# Patient Record
Sex: Male | Born: 1955 | Race: White | Hispanic: No | Marital: Single | Smoking: Former smoker
Health system: Southern US, Community
[De-identification: ages and names within clinical notes are randomized; demographics above are authoritative.]

## PROBLEM LIST (undated history)

## (undated) DIAGNOSIS — H269 Unspecified cataract: Secondary | ICD-10-CM

## (undated) DIAGNOSIS — M1990 Unspecified osteoarthritis, unspecified site: Secondary | ICD-10-CM

## (undated) DIAGNOSIS — E785 Hyperlipidemia, unspecified: Secondary | ICD-10-CM

## (undated) DIAGNOSIS — I10 Essential (primary) hypertension: Secondary | ICD-10-CM

## (undated) HISTORY — DX: Unspecified cataract: H26.9

## (undated) HISTORY — DX: Hyperlipidemia, unspecified: E78.5

## (undated) HISTORY — PX: COLONOSCOPY: SHX174

## (undated) HISTORY — DX: Essential (primary) hypertension: I10

## (undated) HISTORY — DX: Unspecified osteoarthritis, unspecified site: M19.90

---

## 2003-12-31 ENCOUNTER — Emergency Department (HOSPITAL_COMMUNITY): Admission: EM | Admit: 2003-12-31 | Discharge: 2003-12-31 | Payer: Self-pay | Admitting: Family Medicine

## 2016-04-05 DIAGNOSIS — Z1159 Encounter for screening for other viral diseases: Secondary | ICD-10-CM | POA: Diagnosis not present

## 2016-04-05 DIAGNOSIS — I10 Essential (primary) hypertension: Secondary | ICD-10-CM | POA: Diagnosis not present

## 2016-04-05 DIAGNOSIS — I2510 Atherosclerotic heart disease of native coronary artery without angina pectoris: Secondary | ICD-10-CM | POA: Diagnosis not present

## 2016-04-05 DIAGNOSIS — E7800 Pure hypercholesterolemia, unspecified: Secondary | ICD-10-CM | POA: Diagnosis not present

## 2016-04-17 DIAGNOSIS — M5431 Sciatica, right side: Secondary | ICD-10-CM | POA: Diagnosis not present

## 2016-04-17 DIAGNOSIS — M9901 Segmental and somatic dysfunction of cervical region: Secondary | ICD-10-CM | POA: Diagnosis not present

## 2016-04-17 DIAGNOSIS — M9904 Segmental and somatic dysfunction of sacral region: Secondary | ICD-10-CM | POA: Diagnosis not present

## 2016-04-17 DIAGNOSIS — M531 Cervicobrachial syndrome: Secondary | ICD-10-CM | POA: Diagnosis not present

## 2016-08-09 DIAGNOSIS — R52 Pain, unspecified: Secondary | ICD-10-CM | POA: Diagnosis not present

## 2016-08-09 DIAGNOSIS — J4521 Mild intermittent asthma with (acute) exacerbation: Secondary | ICD-10-CM | POA: Diagnosis not present

## 2016-08-09 DIAGNOSIS — B349 Viral infection, unspecified: Secondary | ICD-10-CM | POA: Diagnosis not present

## 2016-08-09 DIAGNOSIS — Z6831 Body mass index (BMI) 31.0-31.9, adult: Secondary | ICD-10-CM | POA: Diagnosis not present

## 2016-08-16 DIAGNOSIS — Z1211 Encounter for screening for malignant neoplasm of colon: Secondary | ICD-10-CM | POA: Diagnosis not present

## 2016-08-16 DIAGNOSIS — B349 Viral infection, unspecified: Secondary | ICD-10-CM | POA: Diagnosis not present

## 2016-08-16 DIAGNOSIS — J4531 Mild persistent asthma with (acute) exacerbation: Secondary | ICD-10-CM | POA: Diagnosis not present

## 2016-08-16 DIAGNOSIS — M109 Gout, unspecified: Secondary | ICD-10-CM | POA: Diagnosis not present

## 2016-11-10 DIAGNOSIS — Z0000 Encounter for general adult medical examination without abnormal findings: Secondary | ICD-10-CM | POA: Diagnosis not present

## 2017-01-08 DIAGNOSIS — M5416 Radiculopathy, lumbar region: Secondary | ICD-10-CM | POA: Diagnosis not present

## 2017-01-08 DIAGNOSIS — M9904 Segmental and somatic dysfunction of sacral region: Secondary | ICD-10-CM | POA: Diagnosis not present

## 2017-01-08 DIAGNOSIS — M9903 Segmental and somatic dysfunction of lumbar region: Secondary | ICD-10-CM | POA: Diagnosis not present

## 2017-01-08 DIAGNOSIS — M545 Low back pain: Secondary | ICD-10-CM | POA: Diagnosis not present

## 2017-03-08 DIAGNOSIS — Z6831 Body mass index (BMI) 31.0-31.9, adult: Secondary | ICD-10-CM | POA: Diagnosis not present

## 2017-03-08 DIAGNOSIS — M2550 Pain in unspecified joint: Secondary | ICD-10-CM | POA: Diagnosis not present

## 2017-03-08 DIAGNOSIS — M7711 Lateral epicondylitis, right elbow: Secondary | ICD-10-CM | POA: Diagnosis not present

## 2017-05-14 DIAGNOSIS — M62830 Muscle spasm of back: Secondary | ICD-10-CM | POA: Diagnosis not present

## 2017-05-14 DIAGNOSIS — M797 Fibromyalgia: Secondary | ICD-10-CM | POA: Diagnosis not present

## 2017-05-14 DIAGNOSIS — M9902 Segmental and somatic dysfunction of thoracic region: Secondary | ICD-10-CM | POA: Diagnosis not present

## 2017-05-14 DIAGNOSIS — M546 Pain in thoracic spine: Secondary | ICD-10-CM | POA: Diagnosis not present

## 2017-05-25 DIAGNOSIS — M546 Pain in thoracic spine: Secondary | ICD-10-CM | POA: Diagnosis not present

## 2017-05-25 DIAGNOSIS — M797 Fibromyalgia: Secondary | ICD-10-CM | POA: Diagnosis not present

## 2017-05-25 DIAGNOSIS — M9902 Segmental and somatic dysfunction of thoracic region: Secondary | ICD-10-CM | POA: Diagnosis not present

## 2017-05-25 DIAGNOSIS — M62830 Muscle spasm of back: Secondary | ICD-10-CM | POA: Diagnosis not present

## 2017-06-04 ENCOUNTER — Ambulatory Visit: Payer: BLUE CROSS/BLUE SHIELD | Admitting: Family Medicine

## 2017-06-04 ENCOUNTER — Encounter: Payer: Self-pay | Admitting: Family Medicine

## 2017-06-04 VITALS — BP 130/80 | HR 72 | Temp 97.9°F | Ht 70.0 in | Wt 223.2 lb

## 2017-06-04 DIAGNOSIS — G8929 Other chronic pain: Secondary | ICD-10-CM | POA: Diagnosis not present

## 2017-06-04 DIAGNOSIS — M1040 Other secondary gout, unspecified site: Secondary | ICD-10-CM

## 2017-06-04 DIAGNOSIS — M1A9XX0 Chronic gout, unspecified, without tophus (tophi): Secondary | ICD-10-CM | POA: Insufficient documentation

## 2017-06-04 DIAGNOSIS — M109 Gout, unspecified: Secondary | ICD-10-CM | POA: Insufficient documentation

## 2017-06-04 DIAGNOSIS — E7800 Pure hypercholesterolemia, unspecified: Secondary | ICD-10-CM

## 2017-06-04 DIAGNOSIS — Z23 Encounter for immunization: Secondary | ICD-10-CM | POA: Diagnosis not present

## 2017-06-04 DIAGNOSIS — M2550 Pain in unspecified joint: Secondary | ICD-10-CM | POA: Diagnosis not present

## 2017-06-04 DIAGNOSIS — I10 Essential (primary) hypertension: Secondary | ICD-10-CM | POA: Diagnosis not present

## 2017-06-04 MED ORDER — CHLORTHALIDONE 25 MG PO TABS: 25.0000 mg | ORAL_TABLET | Freq: Every day | ORAL | 1 refills | Status: DC

## 2017-06-04 MED ORDER — INDOMETHACIN ER 75 MG PO CPCR: 75.0000 mg | ORAL_CAPSULE | Freq: Two times a day (BID) | ORAL | 0 refills | Status: AC

## 2017-06-04 MED ORDER — ROSUVASTATIN CALCIUM 20 MG PO TABS: 20.0000 mg | ORAL_TABLET | Freq: Every day | ORAL | 1 refills | Status: DC

## 2017-06-04 NOTE — Progress Notes (Addendum)
Subjective:  Patient ID: Eric Salas, male    DOB: 07-30-1955  Age: 61 y.o. MRN: [id number]  CC: Establish Care   HPI Eric Salas presents for follow-up of his blood pressure, elevated cholesterol, and gallop.  He is nonfasting today.  He quit tobacco 10 years ago.  He has been doing well.  He and his wife recently adopted 70 year old twins.  His wife had been a Godmother to their mom.  Their mother recently passed one week ago.  He has had no problems with his medicines.  He does not drink or use illicit drugs.  He is having no problems with his medicines.  He is going to try to lose some weight with the [employer].  He was never able to obtain a colonoscopy or a chest x-ray when I last saw him.  He will return for blood work and to see me on exam in we will order those at that time.  History Barkley has a past medical history of Arthritis, Hyperlipidemia, and Hypertension.   He has no past surgical history on file.   His family history includes Alcohol abuse in his father; Cancer in his brother and mother; Diabetes in his brother, brother, and father; Drug abuse in his brother; Hearing loss in his brother; Heart attack in his father; Heart disease in his father; High Cholesterol in his brother, brother, and father; High blood pressure in his father; Miscarriages / [place] in his mother; Stroke in his father.He reports that he has quit smoking. he has never used smokeless tobacco. He reports that he does not drink alcohol or use drugs.  Outpatient Medications Prior to Visit  Medication Sig Dispense Refill  . chlorthalidone (HYGROTON) 25 MG tablet Take 25 mg by mouth. Take 1 tablet by mouth daily.    . indomethacin (INDOCIN SR) 75 MG CR capsule Take 75 mg by mouth. Take 1 capsule by mouth daily.    . rosuvastatin (CRESTOR) 20 MG tablet Take 20 mg by mouth. Take 1 tablet by mouth daily.     No facility-administered medications prior to visit.     ROS Review of Systems    Constitutional: Negative.  Negative for chills, fatigue and fever.  HENT: Positive for postnasal drip. Negative for sinus pain, sore throat and voice change.   Eyes: Negative.   Respiratory: Positive for cough.   Cardiovascular: Negative.   Gastrointestinal: Negative.   Endocrine: Negative for polydipsia and polyuria.  Genitourinary: Negative.   Musculoskeletal: Positive for arthralgias. Negative for myalgias.  Allergic/Immunologic: Negative for immunocompromised state.  Neurological: Negative for weakness and headaches.  Hematological: Negative.   Psychiatric/Behavioral: Negative.     Objective:  BP 130/80   Pulse 72   Temp 97.9 F (36.6 C) (Oral)   Ht 5\' 10"  (1.778 m)   Wt 223 lb 4 oz (101.3 kg)   SpO2 98%   BMI 32.03 kg/m   Physical Exam  Constitutional: He is oriented to person, place, and time. He appears well-developed and well-nourished. No distress.  HENT:  Head: Normocephalic and atraumatic.  Right Ear: External ear normal.  Left Ear: External ear normal.  Mouth/Throat: Oropharynx is clear and moist. No oropharyngeal exudate.  Eyes: Conjunctivae are normal. Pupils are equal, round, and reactive to light. Right eye exhibits no discharge. Left eye exhibits no discharge. No scleral icterus.  Neck: Neck supple. No JVD present. No tracheal deviation present. No thyromegaly present.  Cardiovascular: Normal rate, regular rhythm and normal heart sounds.  Pulmonary/Chest: Effort normal. No stridor. He has decreased breath sounds. He has no wheezes. He has no rhonchi. He has no rales.  Abdominal: Bowel sounds are normal.  Lymphadenopathy:    He has no cervical adenopathy.  Neurological: He is alert and oriented to person, place, and time.  Skin: Skin is warm and dry. He is not diaphoretic.  Psychiatric: He has a normal mood and affect. His behavior is normal.      Assessment & Plan:   Derrius was seen today for establish care.  Diagnoses and all orders for this  visit:  HTN (hypertension), benign -     chlorthalidone (HYGROTON) 25 MG tablet; Take 1 tablet (25 mg total) daily by mouth. Take 1 tablet by mouth daily. -     CBC; Future -     Comprehensive metabolic panel; Future -     TSH; Future -     Urinalysis, Routine w reflex microscopic; Future  Elevated LDL cholesterol level -     rosuvastatin (CRESTOR) 20 MG tablet; Take 1 tablet (20 mg total) daily by mouth. Take 1 tablet by mouth daily. -     Comprehensive metabolic panel; Future -     Lipid panel; Future -     TSH; Future  Other secondary gout, unspecified chronicity, unspecified site -     indomethacin (INDOCIN SR) 75 MG CR capsule; Take 1 capsule (75 mg total) 2 (two) times daily with a meal by mouth. As needed for gout attacks. -     CBC; Future -     Comprehensive metabolic panel; Future -     Urinalysis, Routine w reflex microscopic; Future -     Uric acid  Need for influenza vaccination -     Flu Vaccine QUAD 36+ mos IM  Chronic joint pain   I have changed Remo Lipps D. Mcclarty's chlorthalidone and rosuvastatin.  Meds ordered this encounter  Medications  . chlorthalidone (HYGROTON) 25 MG tablet    Sig: Take 1 tablet (25 mg total) daily by mouth. Take 1 tablet by mouth daily.    Dispense:  90 tablet    Refill:  1  . indomethacin (INDOCIN SR) 75 MG CR capsule    Sig: Take 1 capsule (75 mg total) 2 (two) times daily with a meal by mouth. As needed for gout attacks.    Dispense:  60 capsule    Refill:  0  . rosuvastatin (CRESTOR) 20 MG tablet    Sig: Take 1 tablet (20 mg total) daily by mouth. Take 1 tablet by mouth daily.    Dispense:  90 tablet    Refill:  1     Follow-up: Return in about 3 months (around 09/04/2017).  Libby Maw, MD

## 2017-06-05 ENCOUNTER — Encounter: Payer: Self-pay | Admitting: Family Medicine

## 2017-06-11 ENCOUNTER — Ambulatory Visit: Payer: BLUE CROSS/BLUE SHIELD | Admitting: Family Medicine

## 2017-06-11 DIAGNOSIS — I10 Essential (primary) hypertension: Secondary | ICD-10-CM | POA: Diagnosis not present

## 2017-06-11 DIAGNOSIS — M2550 Pain in unspecified joint: Secondary | ICD-10-CM

## 2017-06-11 DIAGNOSIS — M1040 Other secondary gout, unspecified site: Secondary | ICD-10-CM | POA: Diagnosis not present

## 2017-06-11 DIAGNOSIS — E7800 Pure hypercholesterolemia, unspecified: Secondary | ICD-10-CM

## 2017-06-11 DIAGNOSIS — G8929 Other chronic pain: Secondary | ICD-10-CM

## 2017-06-11 LAB — CBC
HEMATOCRIT: 42.6 % (ref 39.0–52.0)
HEMOGLOBIN: 14.3 g/dL (ref 13.0–17.0)
MCHC: 33.5 g/dL (ref 30.0–36.0)
MCV: 90.7 fl (ref 78.0–100.0)
PLATELETS: 167 10*3/uL (ref 150.0–400.0)
RBC: 4.7 Mil/uL (ref 4.22–5.81)
RDW: 12.8 % (ref 11.5–15.5)
WBC: 6.6 10*3/uL (ref 4.0–10.5)

## 2017-06-11 LAB — COMPREHENSIVE METABOLIC PANEL
ALK PHOS: 44 U/L (ref 39–117)
ALT: 21 U/L (ref 0–53)
AST: 19 U/L (ref 0–37)
Albumin: 4.2 g/dL (ref 3.5–5.2)
BUN: 19 mg/dL (ref 6–23)
CHLORIDE: 101 meq/L (ref 96–112)
CO2: 33 meq/L — AB (ref 19–32)
Calcium: 9.3 mg/dL (ref 8.4–10.5)
Creatinine, Ser: 0.98 mg/dL (ref 0.40–1.50)
GFR: 82.77 mL/min (ref >60.00)
GLUCOSE: 116 mg/dL — AB (ref 70–99)
POTASSIUM: 4.1 meq/L (ref 3.5–5.1)
SODIUM: 142 meq/L (ref 135–145)
TOTAL PROTEIN: 6.7 g/dL (ref 6.0–8.3)
Total Bilirubin: 0.5 mg/dL (ref 0.2–1.2)

## 2017-06-11 LAB — URINALYSIS, ROUTINE W REFLEX MICROSCOPIC
Bilirubin Urine: NEGATIVE
Hgb urine dipstick: NEGATIVE
Ketones, ur: NEGATIVE
Leukocytes, UA: NEGATIVE
NITRITE: NEGATIVE
RBC / HPF: NONE SEEN (ref 0–?)
SPECIFIC GRAVITY, URINE: 1.02 (ref 1.000–1.030)
Total Protein, Urine: NEGATIVE
URINE GLUCOSE: NEGATIVE
UROBILINOGEN UA: 0.2 (ref 0.0–1.0)
WBC UA: NONE SEEN (ref 0–?)
pH: 6 (ref 5.0–8.0)

## 2017-06-11 LAB — TSH: TSH: 1.08 u[IU]/mL (ref 0.35–4.50)

## 2017-06-11 LAB — LIPID PANEL
CHOL/HDL RATIO: 3
Cholesterol: 125 mg/dL (ref 0–200)
HDL: 39.6 mg/dL (ref >39.00)
LDL Cholesterol: 70 mg/dL (ref 0–99)
NONHDL: 85.46
Triglycerides: 78 mg/dL (ref 0.0–149.0)
VLDL: 15.6 mg/dL (ref 0.0–40.0)

## 2017-06-12 ENCOUNTER — Encounter: Payer: Self-pay | Admitting: Family Medicine

## 2017-06-12 NOTE — Progress Notes (Addendum)
Labs only

## 2017-08-01 ENCOUNTER — Telehealth: Payer: Self-pay

## 2017-08-01 DIAGNOSIS — G8929 Other chronic pain: Secondary | ICD-10-CM | POA: Insufficient documentation

## 2017-08-01 DIAGNOSIS — Z23 Encounter for immunization: Secondary | ICD-10-CM | POA: Insufficient documentation

## 2017-08-01 DIAGNOSIS — M2550 Pain in unspecified joint: Principal | ICD-10-CM

## 2017-08-01 NOTE — Addendum Note (Signed)
Addended by: Jon Billings on: 08/01/2017 09:42 AM   Modules accepted: Orders

## 2017-08-01 NOTE — Telephone Encounter (Signed)
Okay to refer to neurology?   Copied from [place] [id number]. Topic: Referral - Request >> Aug 01, 2017  8:13 AM Ether Griffins B wrote: Reason for CRM: pt requesting referral for neurology due to a lot of joint pain.

## 2017-08-01 NOTE — Telephone Encounter (Signed)
Patient wants to see Ortho, not neurology.

## 2017-08-01 NOTE — Telephone Encounter (Signed)
Referral has been entered.

## 2017-08-02 NOTE — Telephone Encounter (Signed)
Patient has been scheduled for 4pm tomorrow to see Dr. Raeford Razor, patient is aware.

## 2017-08-03 ENCOUNTER — Ambulatory Visit: Payer: BLUE CROSS/BLUE SHIELD | Admitting: Family Medicine

## 2017-08-03 ENCOUNTER — Encounter: Payer: Self-pay | Admitting: Family Medicine

## 2017-08-03 VITALS — BP 142/80 | HR 60 | Temp 97.8°F | Ht 70.0 in | Wt 223.0 lb

## 2017-08-03 DIAGNOSIS — G8929 Other chronic pain: Secondary | ICD-10-CM

## 2017-08-03 DIAGNOSIS — M2550 Pain in unspecified joint: Secondary | ICD-10-CM

## 2017-08-03 DIAGNOSIS — M25562 Pain in left knee: Secondary | ICD-10-CM | POA: Diagnosis not present

## 2017-08-03 DIAGNOSIS — M1A9XX0 Chronic gout, unspecified, without tophus (tophi): Secondary | ICD-10-CM | POA: Diagnosis not present

## 2017-08-03 DIAGNOSIS — M7041 Prepatellar bursitis, right knee: Secondary | ICD-10-CM

## 2017-08-03 LAB — URIC ACID: Uric Acid, Serum: 8.9 mg/dL — ABNORMAL HIGH (ref 4.0–7.8)

## 2017-08-03 LAB — CK: CK TOTAL: 110 U/L (ref 7–232)

## 2017-08-03 MED ORDER — DICLOFENAC SODIUM 2 % TD SOLN: 1.0000 "application " | Freq: Two times a day (BID) | TRANSDERMAL | 3 refills | Status: DC

## 2017-08-03 NOTE — Patient Instructions (Signed)
We will call you with the results from today  Try wearing an ACE wrap on your right knee  I have sent a medicine to rub onto your left knee

## 2017-08-03 NOTE — Progress Notes (Signed)
Eric Salas - 62 y.o. male MRN [id number]  Date of birth: May 13, 1956  SUBJECTIVE:  Including CC & ROS.  Chief Complaint  Patient presents with  . Bilateral knee pain    Eric Salas is a 62 y.o. male that is presenting with bilateral knee pain. Pain has been ongoing for five days. Left is worse than right. He states the pain was severe last night. He was unable to bend his knee. He states swelling is present. Denies any injury to his knee. He has history of gout, he took two Indocin last night which improved his pain. He thinks it could be his gout flaring up. He applied ice and heat which improved some. Pain is located in the medial aspect of his left knee. He has pain on the anterior aspect of his right knee. He has to work on his Conservation officer, historic buildings. He feels the right knee pain is acute. The left knee pain is acute on chronic. The pain is stabbing and can be severe in nature. The pain is localized to the knee.      Review of Systems  Constitutional: Negative for fever.  HENT: Negative for sinus pain.   Respiratory: Negative for cough.   Cardiovascular: Negative for chest pain.  Gastrointestinal: Negative for abdominal pain.  Musculoskeletal: Positive for arthralgias and joint swelling. Negative for gait problem.  Skin: Negative for color change.  Neurological: Negative for weakness.  Hematological: Negative for adenopathy.  Psychiatric/Behavioral: Negative for agitation.    HISTORY: Past Medical, Surgical, Social, and Family History Reviewed & Updated per EMR.   Pertinent Historical Findings include:  Past Medical History:  Diagnosis Date  . Arthritis   . Hyperlipidemia   . Hypertension     No past surgical history on file.  Allergies  Allergen Reactions  . Codeine     Family History  Problem Relation Age of Onset  . Cancer Mother   . Miscarriages / [place] Mother   . Alcohol abuse Father   . Diabetes Father   . Heart attack Father   . Heart  disease Father   . High Cholesterol Father   . High blood pressure Father   . Stroke Father   . Diabetes Brother   . Hearing loss Brother   . High Cholesterol Brother   . Cancer Brother   . Diabetes Brother   . Drug abuse Brother   . High Cholesterol Brother      Social History   Socioeconomic History  . Marital status: Married    Spouse name: Not on file  . Number of children: Not on file  . Years of education: Not on file  . Highest education level: Not on file  Social Needs  . Financial resource strain: Not hard at all  . Food insecurity - worry: Never true  . Food insecurity - inability: Never true  . Transportation needs - medical: Not on file  . Transportation needs - non-medical: Not on file  Occupational History  . Not on file  Tobacco Use  . Smoking status: Former Research scientist (life sciences)  . Smokeless tobacco: Never Used  Substance and Sexual Activity  . Alcohol use: No    Frequency: Never  . Drug use: No  . Sexual activity: Yes    Partners: Female  Other Topics Concern  . Not on file  Social History Narrative  . Not on file     PHYSICAL EXAM:  VS: BP (!) 142/80 (BP Location: Left Arm, Patient Position: Sitting,  Cuff Size: Normal)   Pulse 60   Temp 97.8 F (36.6 C) (Oral)   Ht 5\' 10"  (1.778 m)   Wt 223 lb (101.2 kg)   SpO2 97%   BMI 32.00 kg/m  Physical Exam Gen: NAD, alert, cooperative with exam, well-appearing ENT: normal lips, normal nasal mucosa,  Eye: normal EOM, normal conjunctiva and lids CV:  no edema, +2 pedal pulses   Resp: no accessory muscle use, non-labored,  Skin: no rashes, no areas of induration  Neuro: normal tone, normal sensation to touch Psych:  normal insight, alert and oriented MSK:  Left Knee: Normal to inspection with no erythema or effusion or obvious bony abnormalities. Palpation normal with no warmth, joint line tenderness, patellar tenderness, or condyle tenderness. ROM full in flexion and extension and lower leg  rotation. Ligaments with solid consistent endpoints including  LCL, MCL. Negative Mcmurray's Non painful patellar compression. Patellar glide without crepitus. Patellar and quadriceps tendons unremarkable. Hamstring and quadriceps strength is normal.  Right knee:  Prepatellar bursa present. No tenderness to palpation of the medial lateral joint line. Normal flexion and extension. Normal strength loquat a patellar testing. Neurovascularly intact   Limited ultrasound: Right and left knee:  Left knee:  Mild to moderate effusion within the suprapatellar pouch. Minimal medial joint space narrowing. 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 Right knee with a prepatellar bursitis  Ultrasound and interpretation by Clearance Coots, MD   Aspiration/Injection Procedure Note Eric Salas 25-May-1956  Procedure: Injection Indications: left knee pain   Procedure Details Consent: Risks of procedure as well as the alternatives and risks of each were explained to the (patient/caregiver).  Consent for procedure obtained. Time Out: Verified patient identification, verified procedure, site/side was marked, verified correct patient position, special equipment/implants available, medications/allergies/relevent history reviewed, required imaging and test results available.  Performed.  The area was cleaned with iodine and alcohol swabs.    The left knee joint was injected using 1 cc's of 40 mg  kenalog and 4 cc's of 0.25% bupivacaine with a 22 1 1/2" needle.  Ultrasound was used. Images were obtained in Long views showing the injection.    A sterile dressing was applied.  Patient did tolerate procedure  well.       ASSESSMENT & PLAN:   Prepatellar bursitis of right knee Likely traumatic from working on his knees. No signs of infection - Counseled and advised compression - If no improvement can aspirate  Chronic pain of left knee It appears that he has some crystalline deposition within the meniscus to suggest some chondrocalcinosis. Has likely a component of arthritic change. - Injection today - Provided Pennsaid - If no improvement consider x-rays and physical therapy.  Gout Has not had a uric acid level checked in some time. Does not take any controller medication. Reports having a couple of flares past week - Uric acid - Counseled and discussed that if his levels are elevated then being on a controller medication may be beneficial.  Chronic joint pain Possible that his pain could be related to gout. He has been taken a statin for a few years and could be associated with the statin use. - CK - Counseled to take the statin every other day

## 2017-08-03 NOTE — Assessment & Plan Note (Signed)
Likely traumatic from working on his knees. No signs of infection - Counseled and advised compression - If no improvement can aspirate

## 2017-08-03 NOTE — Assessment & Plan Note (Signed)
It appears that he has some crystalline deposition within the meniscus to suggest some chondrocalcinosis. Has likely a component of arthritic change. - Injection today - Provided Pennsaid - If no improvement consider x-rays and physical therapy.

## 2017-08-03 NOTE — Assessment & Plan Note (Signed)
Possible that his pain could be related to gout. He has been taken a statin for a few years and could be associated with the statin use. - CK - Counseled to take the statin every other day

## 2017-08-03 NOTE — Assessment & Plan Note (Signed)
Has not had a uric acid level checked in some time. Does not take any controller medication. Reports having a couple of flares past week - Uric acid - Counseled and discussed that if his levels are elevated then being on a controller medication may be beneficial.

## 2017-08-06 ENCOUNTER — Telehealth: Payer: Self-pay | Admitting: Family Medicine

## 2017-08-06 MED ORDER — ALLOPURINOL 100 MG PO TABS: 100.0000 mg | ORAL_TABLET | Freq: Every day | ORAL | 6 refills | Status: DC

## 2017-08-06 NOTE — Telephone Encounter (Signed)
Discussed his results. Elevated uric acid so will start allopurinol.   Could consider switching chlorthalidone to losartan to help with lowering uric acid.   Rosemarie Ax, MD [facility] 08/06/2017, 10:10 AM

## 2017-09-04 ENCOUNTER — Encounter: Payer: Self-pay | Admitting: Family Medicine

## 2017-09-04 ENCOUNTER — Encounter: Payer: Self-pay | Admitting: Gastroenterology

## 2017-09-04 ENCOUNTER — Ambulatory Visit: Payer: BLUE CROSS/BLUE SHIELD | Admitting: Family Medicine

## 2017-09-04 VITALS — BP 130/80 | HR 65 | Ht 70.0 in | Wt 225.2 lb

## 2017-09-04 DIAGNOSIS — G8929 Other chronic pain: Secondary | ICD-10-CM | POA: Diagnosis not present

## 2017-09-04 DIAGNOSIS — Z1211 Encounter for screening for malignant neoplasm of colon: Secondary | ICD-10-CM | POA: Diagnosis not present

## 2017-09-04 DIAGNOSIS — Z125 Encounter for screening for malignant neoplasm of prostate: Secondary | ICD-10-CM

## 2017-09-04 DIAGNOSIS — I10 Essential (primary) hypertension: Secondary | ICD-10-CM | POA: Diagnosis not present

## 2017-09-04 DIAGNOSIS — M2550 Pain in unspecified joint: Secondary | ICD-10-CM

## 2017-09-04 DIAGNOSIS — M109 Gout, unspecified: Secondary | ICD-10-CM

## 2017-09-04 DIAGNOSIS — E7800 Pure hypercholesterolemia, unspecified: Secondary | ICD-10-CM

## 2017-09-04 DIAGNOSIS — Z0000 Encounter for general adult medical examination without abnormal findings: Secondary | ICD-10-CM | POA: Insufficient documentation

## 2017-09-04 MED ORDER — IRBESARTAN 150 MG PO TABS: 150.0000 mg | ORAL_TABLET | Freq: Every day | ORAL | 0 refills | Status: DC

## 2017-09-04 MED ORDER — ROSUVASTATIN CALCIUM 20 MG PO TABS: 20.0000 mg | ORAL_TABLET | ORAL | 0 refills | Status: DC

## 2017-09-04 MED ORDER — INDOMETHACIN 50 MG PO CAPS: 50.0000 mg | ORAL_CAPSULE | Freq: Three times a day (TID) | ORAL | 1 refills | Status: DC

## 2017-09-04 MED ORDER — COLCHICINE 0.6 MG PO TABS: 0.6000 mg | ORAL_TABLET | Freq: Two times a day (BID) | ORAL | 0 refills | Status: DC

## 2017-09-04 MED ORDER — ALLOPURINOL 100 MG PO TABS: 100.0000 mg | ORAL_TABLET | Freq: Every day | ORAL | 1 refills | Status: DC

## 2017-09-04 NOTE — Progress Notes (Signed)
Subjective:  Patient ID: Eric Salas, male    DOB: Apr 01, 1956  Age: 62 y.o. MRN: [id number]  CC: Follow-up   HPI Eric Salas presents for follow-up of his hypertension, gout and elevated cholesterol.  Sports medicine doctor introduced allopurinol and patient developed gouty attacks while taking it so he was forced to discontinue it.  Patient has never taken colchicine before.  Patient feels as though he is developed some joint aches and pains taking the Crestor daily.  He decreased the Crestor to every other day and his joint aches and pains had improved.  He tells that the left knee responded nicely to a steroid injection.  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  He is due for colonoscopy.  He has had no symptoms referrable to the GI tract.  His urine flow is good.  Outpatient Medications Prior to Visit  Medication Sig Dispense Refill  . Diclofenac Sodium (PENNSAID) 2 % SOLN Place 1 application onto the skin 2 (two) times daily. 1 Bottle 3  . allopurinol (ZYLOPRIM) 100 MG tablet Take 1 tablet (100 mg total) by mouth daily. 30 tablet 6  . chlorthalidone (HYGROTON) 25 MG tablet Take 1 tablet (25 mg total) daily by mouth. Take 1 tablet by mouth daily. 90 tablet 1  . rosuvastatin (CRESTOR) 20 MG tablet Take 20 mg by mouth every other day.    . rosuvastatin (CRESTOR) 20 MG tablet Take 1 tablet (20 mg total) daily by mouth. Take 1 tablet by mouth daily. (Patient taking differently: Take 20 mg by mouth every other day. Take 1 tablet by mouth daily.) 90 tablet 1   No facility-administered medications prior to visit.     ROS Review of Systems  Constitutional: Negative.   HENT: Negative.   Eyes: Negative for photophobia.  Respiratory: Negative.   Cardiovascular: Negative.   Gastrointestinal: Negative.  Negative for anal bleeding, blood in stool, constipation and diarrhea.  Endocrine: Negative for polyphagia and polyuria.    Genitourinary: Negative for difficulty urinating, frequency and urgency.  Musculoskeletal: Positive for arthralgias.  Skin: Negative for rash.  Allergic/Immunologic: Negative for immunocompromised state.  Neurological: Negative for weakness and headaches.  Hematological: Does not bruise/bleed easily.  Psychiatric/Behavioral: Negative.     Objective:  BP 130/80 (BP Location: Left Arm, Patient Position: Sitting, Cuff Size: Normal)   Pulse 65   Ht 5\' 10"  (1.778 m)   Wt 225 lb 4 oz (102.2 kg)   SpO2 98%   BMI 32.32 kg/m   BP Readings from Last 3 Encounters:  09/04/17 130/80  08/03/17 (!) 142/80  06/04/17 130/80    Wt Readings from Last 3 Encounters:  09/04/17 225 lb 4 oz (102.2 kg)  08/03/17 223 lb (101.2 kg)  06/04/17 223 lb 4 oz (101.3 kg)    Physical Exam  Constitutional: He is oriented to person, place, and time. He appears well-developed and well-nourished. No distress.  HENT:  Head: Normocephalic and atraumatic.  Right Ear: External ear normal.  Left Ear: External ear normal.  Eyes: Right eye exhibits no discharge. Left eye exhibits no discharge. No scleral icterus.  Neck: No JVD present. No tracheal deviation present.  Cardiovascular: Normal rate, regular rhythm and normal heart sounds.  Pulmonary/Chest: Effort normal and breath sounds normal. No stridor.  Abdominal: Bowel sounds are normal.  Neurological: He is alert and oriented to person, place, and time.  Skin: Skin is dry. No rash noted. He  is not diaphoretic. No erythema.  Psychiatric: He has a normal mood and affect. His behavior is normal.    Lab Results  Component Value Date   WBC 6.6 06/11/2017   HGB 14.3 06/11/2017   HCT 42.6 06/11/2017   PLT 167.0 06/11/2017   GLUCOSE 116 (H) 06/11/2017   CHOL 125 06/11/2017   TRIG 78.0 06/11/2017   HDL 39.60 06/11/2017   LDLCALC 70 06/11/2017   ALT 21 06/11/2017   AST 19 06/11/2017   NA 142 06/11/2017   K 4.1 06/11/2017   CL 101 06/11/2017   CREATININE  0.98 06/11/2017   BUN 19 06/11/2017   CO2 33 (H) 06/11/2017   TSH 1.08 06/11/2017    No results found.  Assessment & Plan:   Miqueas was seen today for follow-up.  Diagnoses and all orders for this visit:  HTN (hypertension), benign -     irbesartan (AVAPRO) 150 MG tablet; Take 1 tablet (150 mg total) by mouth daily. -     Comprehensive metabolic panel; Future  Elevated LDL cholesterol level -     rosuvastatin (CRESTOR) 20 MG tablet; Take 1 tablet (20 mg total) by mouth every other day. -     Comprehensive metabolic panel; Future -     Lipid panel; Future  Chronic joint pain  Gout, unspecified cause, unspecified chronicity, unspecified site -     allopurinol (ZYLOPRIM) 100 MG tablet; Take 1 tablet (100 mg total) by mouth daily. -     colchicine 0.6 MG tablet; Take 1 tablet (0.6 mg total) by mouth 2 (two) times daily. -     Comprehensive metabolic panel; Future -     Uric acid; Future -     indomethacin (INDOCIN) 50 MG capsule; Take 1 capsule (50 mg total) by mouth 3 (three) times daily with meals. As needed for gouty flairs.  Screen for colon cancer -     Ambulatory referral to Gastroenterology  Screening for prostate cancer -     PSA; Future   I have discontinued Eric Salas. Eric Salas's chlorthalidone and rosuvastatin. I have also changed his rosuvastatin. Additionally, I am having him start on irbesartan, colchicine, and indomethacin. Lastly, I am having him maintain his Diclofenac Sodium and allopurinol.  Meds ordered this encounter  Medications  . allopurinol (ZYLOPRIM) 100 MG tablet    Sig: Take 1 tablet (100 mg total) by mouth daily.    Dispense:  90 tablet    Refill:  1  . irbesartan (AVAPRO) 150 MG tablet    Sig: Take 1 tablet (150 mg total) by mouth daily.    Dispense:  90 tablet    Refill:  0  . rosuvastatin (CRESTOR) 20 MG tablet    Sig: Take 1 tablet (20 mg total) by mouth every other day.    Dispense:  90 tablet    Refill:  0  . colchicine 0.6 MG  tablet    Sig: Take 1 tablet (0.6 mg total) by mouth 2 (two) times daily.    Dispense:  180 tablet    Refill:  0  . indomethacin (INDOCIN) 50 MG capsule    Sig: Take 1 capsule (50 mg total) by mouth 3 (three) times daily with meals. As needed for gouty flairs.    Dispense:  120 capsule    Refill:  1     Follow-up: No Follow-up on file.  Libby Maw, MD

## 2017-09-18 ENCOUNTER — Encounter: Payer: Self-pay | Admitting: Family Medicine

## 2017-09-18 ENCOUNTER — Ambulatory Visit: Payer: BLUE CROSS/BLUE SHIELD | Admitting: Family Medicine

## 2017-09-18 VITALS — BP 136/70 | HR 72 | Ht 70.0 in | Wt 227.4 lb

## 2017-09-18 DIAGNOSIS — M109 Gout, unspecified: Secondary | ICD-10-CM | POA: Diagnosis not present

## 2017-09-18 DIAGNOSIS — R7309 Other abnormal glucose: Secondary | ICD-10-CM

## 2017-09-18 DIAGNOSIS — Z125 Encounter for screening for malignant neoplasm of prostate: Secondary | ICD-10-CM

## 2017-09-18 DIAGNOSIS — E7800 Pure hypercholesterolemia, unspecified: Secondary | ICD-10-CM

## 2017-09-18 DIAGNOSIS — I10 Essential (primary) hypertension: Secondary | ICD-10-CM | POA: Diagnosis not present

## 2017-09-18 LAB — LIPID PANEL
CHOL/HDL RATIO: 3
CHOLESTEROL: 150 mg/dL (ref 0–200)
HDL: 44.3 mg/dL (ref >39.00)
NonHDL: 106.02
Triglycerides: 227 mg/dL — ABNORMAL HIGH (ref 0.0–149.0)
VLDL: 45.4 mg/dL — ABNORMAL HIGH (ref 0.0–40.0)

## 2017-09-18 LAB — COMPREHENSIVE METABOLIC PANEL
ALK PHOS: 49 U/L (ref 39–117)
ALT: 21 U/L (ref 0–53)
AST: 18 U/L (ref 0–37)
Albumin: 4.2 g/dL (ref 3.5–5.2)
BILIRUBIN TOTAL: 0.4 mg/dL (ref 0.2–1.2)
BUN: 20 mg/dL (ref 6–23)
CALCIUM: 9.6 mg/dL (ref 8.4–10.5)
CO2: 31 meq/L (ref 19–32)
Chloride: 102 mEq/L (ref 96–112)
Creatinine, Ser: 1.11 mg/dL (ref 0.40–1.50)
GFR: 71.63 mL/min (ref >60.00)
GLUCOSE: 152 mg/dL — AB (ref 70–99)
Potassium: 3.8 mEq/L (ref 3.5–5.1)
Sodium: 140 mEq/L (ref 135–145)
TOTAL PROTEIN: 6.7 g/dL (ref 6.0–8.3)

## 2017-09-18 LAB — URIC ACID: Uric Acid, Serum: 8.8 mg/dL — ABNORMAL HIGH (ref 4.0–7.8)

## 2017-09-18 LAB — LDL CHOLESTEROL, DIRECT: Direct LDL: 81 mg/dL

## 2017-09-18 LAB — PSA: PSA: 1.23 ng/mL (ref 0.10–4.00)

## 2017-09-18 MED ORDER — ROSUVASTATIN CALCIUM 20 MG PO TABS: 20.0000 mg | ORAL_TABLET | ORAL | 0 refills | Status: DC

## 2017-09-18 MED ORDER — IRBESARTAN 150 MG PO TABS: 150.0000 mg | ORAL_TABLET | Freq: Every day | ORAL | 0 refills | Status: DC

## 2017-09-18 MED ORDER — ALLOPURINOL 100 MG PO TABS: 100.0000 mg | ORAL_TABLET | Freq: Every day | ORAL | 1 refills | Status: DC

## 2017-09-18 MED ORDER — COLCHICINE 0.6 MG PO TABS: 0.6000 mg | ORAL_TABLET | Freq: Two times a day (BID) | ORAL | 0 refills | Status: DC

## 2017-09-18 NOTE — Progress Notes (Addendum)
Subjective:  Patient ID: Eric Salas, male    DOB: 06-14-1956  Age: 62 y.o. MRN: [id number]  CC: Follow-up   HPI Xzayvier Fagin Kretzschmar presents for follow-up of hypertension and elevated cholesterol.  Blood pressure and cholesterol been well controlled.  He was unable to obtain the colchicine and has been taking the Indocin 3 times daily with the allopurinol to prevent onset of gouty attack and it is has worked.  He has a biometric screen these to be filled out will have blood work drawn today.  Outpatient Medications Prior to Visit  Medication Sig Dispense Refill  . Diclofenac Sodium (PENNSAID) 2 % SOLN Place 1 application onto the skin 2 (two) times daily. 1 Bottle 3  . indomethacin (INDOCIN) 50 MG capsule Take 1 capsule (50 mg total) by mouth 3 (three) times daily with meals. As needed for gouty flairs. 120 capsule 1  . allopurinol (ZYLOPRIM) 100 MG tablet Take 1 tablet (100 mg total) by mouth daily. 90 tablet 1  . colchicine 0.6 MG tablet Take 1 tablet (0.6 mg total) by mouth 2 (two) times daily. 180 tablet 0  . irbesartan (AVAPRO) 150 MG tablet Take 1 tablet (150 mg total) by mouth daily. 90 tablet 0  . rosuvastatin (CRESTOR) 20 MG tablet Take 1 tablet (20 mg total) by mouth every other day. 90 tablet 0   No facility-administered medications prior to visit.     ROS Review of Systems  Constitutional: Negative.   HENT: Negative.   Eyes: Negative.   Respiratory: Negative.   Cardiovascular: Negative.   Gastrointestinal: Negative.   Endocrine: Negative for polyphagia and polyuria.  Genitourinary: Negative.   Musculoskeletal: Negative for arthralgias and joint swelling.  Skin: Negative.   Allergic/Immunologic: Negative for immunocompromised state.  Neurological: Negative for weakness and headaches.  Hematological: Does not bruise/bleed easily.  Psychiatric/Behavioral: Negative.     Objective:  BP 136/70 (BP Location: Left Arm, Patient Position: Sitting, Cuff Size:  Normal)   Pulse 72   Ht 5\' 10"  (1.778 m)   Wt 227 lb 6 oz (103.1 kg)   SpO2 97%   BMI 32.62 kg/m   BP Readings from Last 3 Encounters:  09/18/17 136/70  09/04/17 130/80  08/03/17 (!) 142/80    Wt Readings from Last 3 Encounters:  09/18/17 227 lb 6 oz (103.1 kg)  09/04/17 225 lb 4 oz (102.2 kg)  08/03/17 223 lb (101.2 kg)    Physical Exam  Constitutional: He is oriented to person, place, and time. He appears well-developed and well-nourished. No distress.  HENT:  Head: Normocephalic and atraumatic.  Right Ear: External ear normal.  Left Ear: External ear normal.  Mouth/Throat: Oropharynx is clear and moist. No oropharyngeal exudate.  Eyes: Conjunctivae are normal. Pupils are equal, round, and reactive to light. Right eye exhibits no discharge. Left eye exhibits no discharge. No scleral icterus.  Neck: Neck supple. No JVD present. No tracheal deviation present. No thyromegaly present.  Cardiovascular: Normal rate, regular rhythm and normal heart sounds.  Pulmonary/Chest: Effort normal and breath sounds normal. No stridor.  Abdominal: Bowel sounds are normal.  Lymphadenopathy:    He has no cervical adenopathy.  Neurological: He is alert and oriented to person, place, and time.  Skin: Skin is warm and dry. He is not diaphoretic.  Psychiatric: He has a normal mood and affect. His behavior is normal.    Lab Results  Component Value Date   WBC 6.6 06/11/2017   HGB 14.3 06/11/2017  HCT 42.6 06/11/2017   PLT 167.0 06/11/2017   GLUCOSE 152 (H) 09/18/2017   CHOL 150 09/18/2017   TRIG 227.0 (H) 09/18/2017   HDL 44.30 09/18/2017   LDLDIRECT 81.0 09/18/2017   LDLCALC 70 06/11/2017   ALT 21 09/18/2017   AST 18 09/18/2017   NA 140 09/18/2017   K 3.8 09/18/2017   CL 102 09/18/2017   CREATININE 1.11 09/18/2017   BUN 20 09/18/2017   CO2 31 09/18/2017   TSH 1.08 06/11/2017   PSA 1.23 09/18/2017    No results found.  Assessment & Plan:   Kiet was seen today for  follow-up.  Diagnoses and all orders for this visit:  HTN (hypertension), benign -     irbesartan (AVAPRO) 150 MG tablet; Take 1 tablet (150 mg total) by mouth daily. -     Comprehensive metabolic panel  Elevated LDL cholesterol level -     rosuvastatin (CRESTOR) 20 MG tablet; Take 1 tablet (20 mg total) by mouth every other day. -     Lipid panel -     Comprehensive metabolic panel  Gout, unspecified cause, unspecified chronicity, unspecified site -     allopurinol (ZYLOPRIM) 100 MG tablet; Take 1 tablet (100 mg total) by mouth daily. -     colchicine 0.6 MG tablet; Take 1 tablet (0.6 mg total) by mouth 2 (two) times daily. -     Uric acid -     Comprehensive metabolic panel  Screening for prostate cancer -     PSA  Elevated glucose -     Hemoglobin A1c  Other orders -     LDL cholesterol, direct   I am having Jahmal Dunavant. Darsey maintain his Diclofenac Sodium, indomethacin, allopurinol, colchicine, irbesartan, and rosuvastatin.  Meds ordered this encounter  Medications  . allopurinol (ZYLOPRIM) 100 MG tablet    Sig: Take 1 tablet (100 mg total) by mouth daily.    Dispense:  90 tablet    Refill:  1  . colchicine 0.6 MG tablet    Sig: Take 1 tablet (0.6 mg total) by mouth 2 (two) times daily.    Dispense:  180 tablet    Refill:  0  . irbesartan (AVAPRO) 150 MG tablet    Sig: Take 1 tablet (150 mg total) by mouth daily.    Dispense:  90 tablet    Refill:  0  . rosuvastatin (CRESTOR) 20 MG tablet    Sig: Take 1 tablet (20 mg total) by mouth every other day.    Dispense:  90 tablet    Refill:  0     Follow-up: Return in about 3 months (around 12/19/2017).  Libby Maw, MD

## 2017-09-18 NOTE — Patient Instructions (Addendum)
Gout Gout is painful swelling that can occur in some of your joints. Gout is a type of arthritis. This condition is caused by having too much uric acid in your body. Uric acid is a chemical that forms when your body breaks down substances called purines. Purines are important for building body proteins. When your body has too much uric acid, sharp crystals can form and build up inside your joints. This causes pain and swelling. Gout attacks can happen quickly and be very painful (acute gout). Over time, the attacks can affect more joints and become more frequent (chronic gout). Gout can also cause uric acid to build up under your skin and inside your kidneys. What are the causes? This condition is caused by too much uric acid in your blood. This can occur because:  Your kidneys do not remove enough uric acid from your blood. This is the most common cause.  Your body makes too much uric acid. This can occur with some cancers and cancer treatments. It can also occur if your body is breaking down too many red blood cells (hemolytic anemia).  You eat too many foods that are high in purines. These foods include organ meats and some seafood. Alcohol, especially beer, is also high in purines.  A gout attack may be triggered by trauma or stress. What increases the risk? This condition is more likely to develop in people who:  Have a family history of gout.  Are male and middle-aged.  Are male and have gone through menopause.  Are obese.  Frequently drink alcohol, especially beer.  Are dehydrated.  Lose weight too quickly.  Have an organ transplant.  Have lead poisoning.  Take certain medicines, including aspirin, cyclosporine, diuretics, levodopa, and niacin.  Have kidney disease or psoriasis.  What are the signs or symptoms? An attack of acute gout happens quickly. It usually occurs in just one joint. The most common place is the big toe. Attacks often start at night. Other joints  that may be affected include joints of the feet, ankle, knee, fingers, wrist, or elbow. Symptoms may include:  Severe pain.  Warmth.  Swelling.  Stiffness.  Tenderness. The affected joint may be very painful to touch.  Shiny, red, or purple skin.  Chills and fever.  Chronic gout may cause symptoms more frequently. More joints may be involved. You may also have white or yellow lumps (tophi) on your hands or feet or in other areas near your joints. How is this diagnosed? This condition is diagnosed based on your symptoms, medical history, and physical exam. You may have tests, such as:  Blood tests to measure uric acid levels.  Removal of joint fluid with a needle (aspiration) to look for uric acid crystals.  X-rays to look for joint damage.  How is this treated? Treatment for this condition has two phases: treating an acute attack and preventing future attacks. Acute gout treatment may include medicines to reduce pain and swelling, including:  NSAIDs.  Steroids. These are strong anti-inflammatory medicines that can be taken by mouth (orally) or injected into a joint.  Colchicine. This medicine relieves pain and swelling when it is taken soon after an attack. It can be given orally or through an IV tube.  Preventive treatment may include:  Daily use of smaller doses of NSAIDs or colchicine.  Use of a medicine that reduces uric acid levels in your blood.  Changes to your diet. You may need to see a specialist about healthy eating (dietitian).  Follow these instructions at home: During a Gout Attack  If directed, apply ice to the affected area: ? Put ice in a plastic bag. ? Place a towel between your skin and the bag. ? Leave the ice on for 20 minutes, 2-3 times a day.  Rest the joint as much as possible. If the affected joint is in your leg, you may be given crutches to use.  Raise (elevate) the affected joint above the level of your heart as often as  possible.  Drink enough fluids to keep your urine clear or pale yellow.  Take over-the-counter and prescription medicines only as told by your health care provider.  Do not drive or operate heavy machinery while taking prescription pain medicine.  Follow instructions from your health care provider about eating or drinking restrictions.  Return to your normal activities as told by your health care provider. Ask your health care provider what activities are safe for you. Avoiding Future Gout Attacks  Follow a low-purine diet as told by your dietitian or health care provider. Avoid foods and drinks that are high in purines, including liver, kidney, anchovies, asparagus, herring, mushrooms, mussels, and beer.  Limit alcohol intake to no more than 1 drink a day for nonpregnant women and 2 drinks a day for men. One drink equals 12 oz of beer, 5 oz of wine, or 1 oz of hard liquor.  Maintain a healthy weight or lose weight if you are overweight. If you want to lose weight, talk with your health care provider. It is important that you do not lose weight too quickly.  Start or maintain an exercise program as told by your health care provider.  Drink enough fluids to keep your urine clear or pale yellow.  Take over-the-counter and prescription medicines only as told by your health care provider.  Keep all follow-up visits as told by your health care provider. This is important. Contact a health care provider if:  You have another gout attack.  You continue to have symptoms of a gout attack after10 days of treatment.  You have side effects from your medicines.  You have chills or a fever.  You have burning pain when you urinate.  You have pain in your lower back or belly. Get help right away if:  You have severe or uncontrolled pain.  You cannot urinate. This information is not intended to replace advice given to you by your health care provider. Make sure you discuss any questions  you have with your health care provider. Document Released: 06/30/2000 Document Revised: 12/09/2015 Document Reviewed: 04/15/2015 Elsevier Interactive Patient Education  2018 [place] Eating Plan DASH stands for "Dietary Approaches to Stop Hypertension." The DASH eating plan is a healthy eating plan that has been shown to reduce high blood pressure (hypertension). It may also reduce your risk for type 2 diabetes, heart disease, and stroke. The DASH eating plan may also help with weight loss. What are tips for following this plan? General guidelines  Avoid eating more than 2,300 mg (milligrams) of salt (sodium) a day. If you have hypertension, you may need to reduce your sodium intake to 1,500 mg a day.  Limit alcohol intake to no more than 1 drink a day for nonpregnant women and 2 drinks a day for men. One drink equals 12 oz of beer, 5 oz of wine, or 1 oz of hard liquor.  Work with your health care provider to maintain a healthy body weight or to lose  weight. Ask what an ideal weight is for you.  Get at least 30 minutes of exercise that causes your heart to beat faster (aerobic exercise) most days of the week. Activities may include walking, swimming, or biking.  Work with your health care provider or diet and nutrition specialist (dietitian) to adjust your eating plan to your individual calorie needs. Reading food labels  Check food labels for the amount of sodium per serving. Choose foods with less than 5 percent of the Daily Value of sodium. Generally, foods with less than 300 mg of sodium per serving fit into this eating plan.  To find whole grains, look for the word "whole" as the first word in the ingredient list. Shopping  Buy products labeled as "low-sodium" or "no salt added."  Buy fresh foods. Avoid canned foods and premade or frozen meals. Cooking  Avoid adding salt when cooking. Use salt-free seasonings or herbs instead of table salt or sea salt. Check with your  health care provider or pharmacist before using salt substitutes.  Do not fry foods. Cook foods using healthy methods such as baking, boiling, grilling, and broiling instead.  Cook with heart-healthy oils, such as olive, canola, soybean, or sunflower oil. Meal planning   Eat a balanced diet that includes: ? 5 or more servings of fruits and vegetables each day. At each meal, try to fill half of your plate with fruits and vegetables. ? Up to 6-8 servings of whole grains each day. ? Less than 6 oz of lean meat, poultry, or fish each day. A 3-oz serving of meat is about the same size as a deck of cards. One egg equals 1 oz. ? 2 servings of low-fat dairy each day. ? A serving of nuts, seeds, or beans 5 times each week. ? Heart-healthy fats. Healthy fats called Omega-3 fatty acids are found in foods such as flaxseeds and coldwater fish, like sardines, salmon, and mackerel.  Limit how much you eat of the following: ? Canned or prepackaged foods. ? Food that is high in trans fat, such as fried foods. ? Food that is high in saturated fat, such as fatty meat. ? Sweets, desserts, sugary drinks, and other foods with added sugar. ? Full-fat dairy products.  Do not salt foods before eating.  Try to eat at least 2 vegetarian meals each week.  Eat more home-cooked food and less restaurant, buffet, and fast food.  When eating at a restaurant, ask that your food be prepared with less salt or no salt, if possible. What foods are recommended? The items listed may not be a complete list. Talk with your dietitian about what dietary choices are best for you. Grains Whole-grain or whole-wheat bread. Whole-grain or whole-wheat pasta. Brown rice. [place]. Bulgur. Whole-grain and low-sodium cereals. Pita bread. Low-fat, low-sodium crackers. Whole-wheat flour tortillas. Vegetables Fresh or frozen vegetables (raw, steamed, roasted, or grilled). Low-sodium or reduced-sodium tomato and vegetable juice.  Low-sodium or reduced-sodium tomato sauce and tomato paste. Low-sodium or reduced-sodium canned vegetables. Fruits All fresh, dried, or frozen fruit. Canned fruit in natural juice (without added sugar). Meat and other protein foods Skinless chicken or [place]. Ground chicken or [place]. Pork with fat trimmed off. Fish and seafood. Egg whites. Dried beans, peas, or lentils. Unsalted nuts, nut butters, and seeds. Unsalted canned beans. Lean cuts of beef with fat trimmed off. Low-sodium, lean deli meat. Dairy Low-fat (1%) or fat-free (skim) milk. Fat-free, low-fat, or reduced-fat cheeses. Nonfat, low-sodium ricotta or cottage cheese. Low-fat or nonfat  yogurt. Low-fat, low-sodium cheese. Fats and oils Soft margarine without trans fats. Vegetable oil. Low-fat, reduced-fat, or light mayonnaise and salad dressings (reduced-sodium). Canola, safflower, olive, soybean, and sunflower oils. Avocado. Seasoning and other foods Herbs. Spices. Seasoning mixes without salt. Unsalted popcorn and pretzels. Fat-free sweets. What foods are not recommended? The items listed may not be a complete list. Talk with your dietitian about what dietary choices are best for you. Grains Baked goods made with fat, such as croissants, muffins, or some breads. Dry pasta or rice meal packs. Vegetables Creamed or fried vegetables. Vegetables in a cheese sauce. Regular canned vegetables (not low-sodium or reduced-sodium). Regular canned tomato sauce and paste (not low-sodium or reduced-sodium). Regular tomato and vegetable juice (not low-sodium or reduced-sodium). Angie Fava. Olives. Fruits Canned fruit in a light or heavy syrup. Fried fruit. Fruit in cream or butter sauce. Meat and other protein foods Fatty cuts of meat. Ribs. Fried meat. Berniece Salines. Sausage. Bologna and other processed lunch meats. Salami. Fatback. Hotdogs. Bratwurst. Salted nuts and seeds. Canned beans with added salt. Canned or smoked fish. Whole eggs or egg yolks. Chicken  or [place] with skin. Dairy Whole or 2% milk, cream, and half-and-half. Whole or full-fat cream cheese. Whole-fat or sweetened yogurt. Full-fat cheese. Nondairy creamers. Whipped toppings. Processed cheese and cheese spreads. Fats and oils Butter. Stick margarine. Lard. Shortening. Ghee. Bacon fat. Tropical oils, such as coconut, palm kernel, or palm oil. Seasoning and other foods Salted popcorn and pretzels. Onion salt, garlic salt, seasoned salt, table salt, and sea salt. Worcestershire sauce. Tartar sauce. Barbecue sauce. Teriyaki sauce. Soy sauce, including reduced-sodium. Steak sauce. Canned and packaged gravies. Fish sauce. Oyster sauce. Cocktail sauce. Horseradish that you find on the shelf. Ketchup. Mustard. Meat flavorings and tenderizers. Bouillon cubes. Hot sauce and Tabasco sauce. Premade or packaged marinades. Premade or packaged taco seasonings. Relishes. Regular salad dressings. Where to find more information:  National Heart, Lung, and [place]: https://wilson-eaton.com/  American Heart Association: www.heart.org Summary  The DASH eating plan is a healthy eating plan that has been shown to reduce high blood pressure (hypertension). It may also reduce your risk for type 2 diabetes, heart disease, and stroke.  With the DASH eating plan, you should limit salt (sodium) intake to 2,300 mg a day. If you have hypertension, you may need to reduce your sodium intake to 1,500 mg a day.  When on the DASH eating plan, aim to eat more fresh fruits and vegetables, whole grains, lean proteins, low-fat dairy, and heart-healthy fats.  Work with your health care provider or diet and nutrition specialist (dietitian) to adjust your eating plan to your individual calorie needs. This information is not intended to replace advice given to you by your health care provider. Make sure you discuss any questions you have with your health care provider. Document Released: 06/22/2011 Document Revised:  06/26/2016 Document Reviewed: 06/26/2016 Elsevier Interactive Patient Education  [employer].

## 2017-09-19 ENCOUNTER — Telehealth: Payer: Self-pay

## 2017-09-19 DIAGNOSIS — R7309 Other abnormal glucose: Secondary | ICD-10-CM | POA: Insufficient documentation

## 2017-09-19 NOTE — Telephone Encounter (Signed)
Left voice message Dr Ethelene Hal wants to add an A1C test to his labwork and he will need to come back in for a blood draw.

## 2017-09-19 NOTE — Addendum Note (Signed)
Addended by: Jon Billings on: 09/19/2017 07:53 AM   Modules accepted: Orders

## 2017-09-24 ENCOUNTER — Telehealth: Payer: Self-pay | Admitting: Family Medicine

## 2017-09-24 NOTE — Telephone Encounter (Signed)
Patient called, left VM to return the call to the office to schedule a fasting Hemoglobin A1C this week as requested by Dr. Ethelene Hal.

## 2017-10-18 ENCOUNTER — Ambulatory Visit (AMBULATORY_SURGERY_CENTER): Payer: BLUE CROSS/BLUE SHIELD | Admitting: *Deleted

## 2017-10-18 ENCOUNTER — Other Ambulatory Visit: Payer: Self-pay

## 2017-10-18 VITALS — Ht 70.0 in | Wt 221.4 lb

## 2017-10-18 DIAGNOSIS — Z1211 Encounter for screening for malignant neoplasm of colon: Secondary | ICD-10-CM

## 2017-10-18 DIAGNOSIS — Z800 Family history of malignant neoplasm of digestive organs: Secondary | ICD-10-CM

## 2017-10-18 NOTE — Progress Notes (Signed)
No egg or soy allergy known to patient  No issues with past sedation with any surgeries  or procedures, no intubation problems  No diet pills per patient No home 02 use per patient  No blood thinners per patient  Pt denies issues with constipation  No A fib or A flutter  EMMI video sent to pt's e mail  

## 2017-10-19 ENCOUNTER — Encounter: Payer: Self-pay | Admitting: Gastroenterology

## 2017-11-01 ENCOUNTER — Encounter: Payer: Self-pay | Admitting: Gastroenterology

## 2017-11-01 ENCOUNTER — Ambulatory Visit (AMBULATORY_SURGERY_CENTER): Payer: BLUE CROSS/BLUE SHIELD | Admitting: Gastroenterology

## 2017-11-01 ENCOUNTER — Other Ambulatory Visit: Payer: Self-pay

## 2017-11-01 VITALS — BP 143/87 | HR 71 | Temp 99.3°F | Resp 12 | Ht 70.0 in | Wt 227.0 lb

## 2017-11-01 DIAGNOSIS — Z800 Family history of malignant neoplasm of digestive organs: Secondary | ICD-10-CM | POA: Diagnosis not present

## 2017-11-01 DIAGNOSIS — D120 Benign neoplasm of cecum: Secondary | ICD-10-CM | POA: Diagnosis not present

## 2017-11-01 DIAGNOSIS — Z1211 Encounter for screening for malignant neoplasm of colon: Secondary | ICD-10-CM | POA: Diagnosis present

## 2017-11-01 MED ORDER — SODIUM CHLORIDE 0.9 % IV SOLN: 500.0000 mL | Freq: Once | INTRAVENOUS | Status: DC

## 2017-11-01 NOTE — Patient Instructions (Signed)
infromation on polyps given  YOU HAD AN ENDOSCOPIC PROCEDURE TODAY AT [facility]:   Refer to the procedure report that was given to you for any specific questions about what was found during the examination.  If the procedure report does not answer your questions, please call your gastroenterologist to clarify.  If you requested that your care partner not be given the details of your procedure findings, then the procedure report has been included in a sealed envelope for you to review at your convenience later.  YOU SHOULD EXPECT: Some feelings of bloating in the abdomen. Passage of more gas than usual.  Walking can help get rid of the air that was put into your GI tract during the procedure and reduce the bloating. If you had a lower endoscopy (such as a colonoscopy or flexible sigmoidoscopy) you may notice spotting of blood in your stool or on the toilet paper. If you underwent a bowel prep for your procedure, you may not have a normal bowel movement for a few days.  Please Note:  You might notice some irritation and congestion in your nose or some drainage.  This is from the oxygen used during your procedure.  There is no need for concern and it should clear up in a day or so.  SYMPTOMS TO REPORT IMMEDIATELY:   Following lower endoscopy (colonoscopy or flexible sigmoidoscopy):  Excessive amounts of blood in the stool  Significant tenderness or worsening of abdominal pains  Swelling of the abdomen that is new, acute  Fever of 100F or higher   For urgent or emergent issues, a gastroenterologist can be reached at any hour by calling [phone].   DIET:  We do recommend a small meal at first, but then you may proceed to your regular diet.  Drink plenty of fluids but you should avoid alcoholic beverages for 24 hours.  ACTIVITY:  You should plan to take it easy for the rest of today and you should NOT DRIVE or use heavy machinery until tomorrow (because of the sedation  medicines used during the test).    FOLLOW UP: Our staff will call the number listed on your records the next business day following your procedure to check on you and address any questions or concerns that you may have regarding the information given to you following your procedure. If we do not reach you, we will leave a message.  However, if you are feeling well and you are not experiencing any problems, there is no need to return our call.  We will assume that you have returned to your regular daily activities without incident.  If any biopsies were taken you will be contacted by phone or by letter within the next 1-3 weeks.  Please call us at [phone] if you have not heard about the biopsies in 3 weeks.    SIGNATURES/CONFIDENTIALITY: You and/or your care partner have signed paperwork which will be entered into your electronic medical record.  These signatures attest to the fact that that the information above on your After Visit Summary has been reviewed and is understood.  Full responsibility of the confidentiality of this discharge information lies with you and/or your care-partner.

## 2017-11-01 NOTE — Progress Notes (Signed)
A and O x3. Report to RN. Tolerated MAC anesthesia well.

## 2017-11-01 NOTE — Op Note (Signed)
[facility] Patient Name: Eric Salas Procedure Date: 11/01/2017 12:29 PM MRN: [id number] Endoscopist: Jackquline Denmark MD, MD Age: 62 Referring MD:  Date of Birth: [birth year] Gender: Male Account #: [id number] Procedure:                Colonoscopy Indications:              Screening in patient at increased risk: Colorectal                            cancer in brother before age 64 Medicines:                Monitored Anesthesia Care Procedure:                Pre-Anesthesia Assessment:                           - Prior to the procedure, a History and Physical                            was performed, and patient medications and                            allergies were reviewed. The patient is competent.                            The risks and benefits of the procedure and the                            sedation options and risks were discussed with the                            patient. All questions were answered and informed                            consent was obtained. Patient identification and                            proposed procedure were verified by the physician                            in the procedure room in the endoscopy suite.                            Mental Status Examination: alert and oriented.                            Prophylactic Antibiotics: The patient does not                            require prophylactic antibiotics. Prior                            Anticoagulants: The patient has taken no previous  anticoagulant or antiplatelet agents. ASA Grade                            Assessment: II - A patient with mild systemic                            disease. After reviewing the risks and benefits,                            the patient was deemed in satisfactory condition to                            undergo the procedure. The anesthesia plan was to                            use monitored anesthesia care (MAC).  Immediately                            prior to administration of medications, the patient                            was re-assessed for adequacy to receive sedatives.                            The heart rate, respiratory rate, oxygen                            saturations, blood pressure, adequacy of pulmonary                            ventilation, and response to care were monitored                            throughout the procedure. The physical status of                            the patient was re-assessed after the procedure.                           After obtaining informed consent, the colonoscope                            was passed under direct vision. Throughout the                            procedure, the patient's blood pressure, pulse, and                            oxygen saturations were monitored continuously. The                            Model CF-HQ190L [id number]) scope was introduced  through the anus and advanced to the 2 cm into the                            ileum. The colonoscopy was performed without                            difficulty. The patient tolerated the procedure                            well. The quality of the bowel preparation was good. Scope In: 1:40:57 PM Scope Out: 1:51:57 PM Scope Withdrawal Time: 0 hours 9 minutes 8 seconds  Total Procedure Duration: 0 hours 11 minutes 0 seconds  Findings:                 Hemorrhoids grade II were found on perianal exam.                           A 4 mm polyp was found in the cecum. The polyp was                            sessile. The polyp was removed with a cold biopsy                            forceps. Resection and retrieval were complete.                           A few small-mouthed diverticula were found in the                            sigmoid colon and descending colon. Complications:            No immediate complications. Estimated Blood Loss:     Estimated  blood loss: none. Impression:               - Colonic polyp status post polypectomy.                           - Mild left colonic diverticulosis. Recommendation:           - Patient has a contact number available for                            emergencies. The signs and symptoms of potential                            delayed complications were discussed with the                            patient. Return to normal activities tomorrow.                            Written discharge instructions were provided to the                            patient.                           -  High fiber diet.                           - Continue present medications.                           - Await pathology results.                           - Repeat colonoscopy date to be determined after                            pending pathology results are reviewed for                            surveillance based on pathology results.                           - Patient would let us know if he has any anorectal                            problems. Jackquline Denmark MD, MD 11/01/2017 2:06:39 PM This report has been signed electronically.

## 2017-11-01 NOTE — Progress Notes (Signed)
Called to room to assist during endoscopic procedure.  Patient ID and intended procedure confirmed with present staff. Received instructions for my participation in the procedure from the performing physician.  

## 2017-11-05 ENCOUNTER — Telehealth: Payer: Self-pay

## 2017-11-05 NOTE — Telephone Encounter (Signed)
  Follow up Call-  Call back number 11/01/2017  Post procedure Call Back phone  # [phone] cell  Permission to leave phone message Yes  Some recent data might be hidden     Patient questions:  Do you have a fever, pain , or abdominal swelling? No. Pain Score  0 *  Have you tolerated food without any problems? Yes.    Have you been able to return to your normal activities? Yes.    Do you have any questions about your discharge instructions: Diet   No. Medications  No. Follow up visit  No.  Do you have questions or concerns about your Care? No.  Actions: * If pain score is 4 or above: No action needed, pain <4.  No problems noted per pt. maw

## 2017-11-11 ENCOUNTER — Encounter: Payer: Self-pay | Admitting: Gastroenterology

## 2017-11-14 DIAGNOSIS — M797 Fibromyalgia: Secondary | ICD-10-CM | POA: Diagnosis not present

## 2017-11-14 DIAGNOSIS — M9901 Segmental and somatic dysfunction of cervical region: Secondary | ICD-10-CM | POA: Diagnosis not present

## 2017-11-14 DIAGNOSIS — M531 Cervicobrachial syndrome: Secondary | ICD-10-CM | POA: Diagnosis not present

## 2017-11-14 DIAGNOSIS — M25511 Pain in right shoulder: Secondary | ICD-10-CM | POA: Diagnosis not present

## 2017-12-18 ENCOUNTER — Other Ambulatory Visit: Payer: Self-pay | Admitting: Family Medicine

## 2017-12-18 DIAGNOSIS — I10 Essential (primary) hypertension: Secondary | ICD-10-CM

## 2018-03-01 ENCOUNTER — Other Ambulatory Visit: Payer: Self-pay | Admitting: Family Medicine

## 2018-03-01 DIAGNOSIS — M109 Gout, unspecified: Secondary | ICD-10-CM

## 2018-03-05 ENCOUNTER — Other Ambulatory Visit: Payer: Self-pay | Admitting: Family Medicine

## 2018-03-05 DIAGNOSIS — M109 Gout, unspecified: Secondary | ICD-10-CM

## 2018-03-21 ENCOUNTER — Other Ambulatory Visit: Payer: Self-pay | Admitting: Family Medicine

## 2018-03-21 DIAGNOSIS — I10 Essential (primary) hypertension: Secondary | ICD-10-CM

## 2018-05-06 DIAGNOSIS — Z23 Encounter for immunization: Secondary | ICD-10-CM | POA: Diagnosis not present

## 2018-05-26 ENCOUNTER — Other Ambulatory Visit: Payer: Self-pay | Admitting: Family Medicine

## 2018-05-26 DIAGNOSIS — E7800 Pure hypercholesterolemia, unspecified: Secondary | ICD-10-CM

## 2018-06-24 ENCOUNTER — Other Ambulatory Visit: Payer: Self-pay | Admitting: Family Medicine

## 2018-06-24 DIAGNOSIS — I10 Essential (primary) hypertension: Secondary | ICD-10-CM

## 2018-06-28 ENCOUNTER — Ambulatory Visit: Payer: BLUE CROSS/BLUE SHIELD | Admitting: Family Medicine

## 2018-06-28 ENCOUNTER — Encounter: Payer: Self-pay | Admitting: Family Medicine

## 2018-06-28 VITALS — BP 132/80 | HR 67 | Ht 70.0 in | Wt 225.0 lb

## 2018-06-28 DIAGNOSIS — M109 Gout, unspecified: Secondary | ICD-10-CM | POA: Diagnosis not present

## 2018-06-28 DIAGNOSIS — M7662 Achilles tendinitis, left leg: Secondary | ICD-10-CM

## 2018-06-28 MED ORDER — COLCHICINE 0.6 MG PO TABS: 0.6000 mg | ORAL_TABLET | Freq: Two times a day (BID) | ORAL | 0 refills | Status: DC

## 2018-06-28 MED ORDER — DICLOFENAC SODIUM 1 % TD GEL: 2.0000 g | Freq: Four times a day (QID) | TRANSDERMAL | 1 refills | Status: DC

## 2018-06-28 NOTE — Progress Notes (Signed)
Established Patient Office Visit  Subjective:  Patient ID: Eric Salas, male    DOB: [birth year]  Age: 62 y.o. MRN: [id number]  CC:  Chief Complaint  Patient presents with  . Foot Pain    left heel    HPI Eric Salas presents for evaluation of left heel pain. It has been ongoing for a week. He has been taking his Indomethacin. He has a history of gout and plantar fascitis.  Patient denies specific injury.  He does climb ladders often at his work.  Past Medical History:  Diagnosis Date  . Arthritis   . Cataract    lt. eye beginning stage  . Hyperlipidemia   . Hypertension     Past Surgical History:  Procedure Laterality Date  . COLONOSCOPY      Family History  Problem Relation Age of Onset  . Cancer Mother   . Miscarriages / [place] Mother   . Alcohol abuse Father   . Diabetes Father   . Heart attack Father   . Heart disease Father   . High Cholesterol Father   . High blood pressure Father   . Stroke Father   . Diabetes Brother   . Hearing loss Brother   . High Cholesterol Brother   . Colon cancer Brother   . Cancer Brother   . Diabetes Brother   . Drug abuse Brother   . High Cholesterol Brother   . Stomach cancer Brother   . Rectal cancer Neg Hx   . Esophageal cancer Neg Hx   . Pancreatic cancer Neg Hx   . Liver cancer Neg Hx   . Prostate cancer Neg Hx     Social History   Socioeconomic History  . Marital status: Married    Spouse name: Not on file  . Number of children: Not on file  . Years of education: Not on file  . Highest education level: Not on file  Occupational History  . Not on file  Social Needs  . Financial resource strain: Not hard at all  . Food insecurity:    Worry: Never true    Inability: Never true  . Transportation needs:    Medical: Not on file    Non-medical: Not on file  Tobacco Use  . Smoking status: Former Smoker    Types: Cigarettes    Last attempt to quit: 10/18/2005    Years since quitting:  12.7  . Smokeless tobacco: Never Used  Substance and Sexual Activity  . Alcohol use: Not Currently    Frequency: Never    Comment: quit 15 years ago  . Drug use: No  . Sexual activity: Yes    Partners: Female  Lifestyle  . Physical activity:    Days per week: Not on file    Minutes per session: Not on file  . Stress: Not on file  Relationships  . Social connections:    Talks on phone: Not on file    Gets together: Not on file    Attends religious service: Not on file    Active member of club or organization: Not on file    Attends meetings of clubs or organizations: Not on file    Relationship status: Not on file  . Intimate partner violence:    Fear of current or ex partner: Not on file    Emotionally abused: Not on file    Physically abused: Not on file    Forced sexual activity: Not on file  Other  Topics Concern  . Not on file  Social History Narrative  . Not on file    Outpatient Medications Prior to Visit  Medication Sig Dispense Refill  . allopurinol (ZYLOPRIM) 100 MG tablet Take 1 tablet (100 mg total) by mouth daily. 90 tablet 1  . indomethacin (INDOCIN) 50 MG capsule TAKE 1 CAPSULE BY MOUTH THREE TIMES DAILY WITH MEALS AS NEEDED FOR  GOUTY  FLAIRS 120 capsule 1  . irbesartan (AVAPRO) 150 MG tablet TAKE 1 TABLET BY MOUTH ONCE DAILY 90 tablet 1  . rosuvastatin (CRESTOR) 20 MG tablet TAKE 1 TABLET BY MOUTH EVERY OTHER DAY 90 tablet 0  . colchicine 0.6 MG tablet TAKE 1 TABLET BY MOUTH TWICE DAILY 180 tablet 0  . 0.9 %  sodium chloride infusion      No facility-administered medications prior to visit.     Allergies  Allergen Reactions  . Codeine Itching    ROS Review of Systems  Constitutional: Negative.   Respiratory: Negative.   Cardiovascular: Negative.   Gastrointestinal: Negative.   Musculoskeletal: Positive for arthralgias and gait problem.  Skin: Negative for pallor and rash.  Psychiatric/Behavioral: Negative.       Objective:    Physical  Exam  Constitutional: He is oriented to person, place, and time. He appears well-developed and well-nourished. No distress.  HENT:  Head: Normocephalic and atraumatic.  Right Ear: External ear normal.  Left Ear: External ear normal.  Eyes: Right eye exhibits no discharge. Left eye exhibits no discharge. No scleral icterus.  Pulmonary/Chest: Effort normal.  Musculoskeletal:     Left foot: Normal range of motion and normal capillary refill. Tenderness and bony tenderness present. No swelling, deformity or laceration.       Feet:  Neurological: He is alert and oriented to person, place, and time.  Skin: He is not diaphoretic.    BP 132/80   Pulse 67   Ht 5\' 10"  (1.778 m)   Wt 225 lb (102.1 kg)   SpO2 98%   BMI 32.28 kg/m  Wt Readings from Last 3 Encounters:  06/28/18 225 lb (102.1 kg)  11/01/17 227 lb (103 kg)  10/18/17 221 lb 6.4 oz (100.4 kg)   BP Readings from Last 3 Encounters:  06/28/18 132/80  11/01/17 (!) 143/87  09/18/17 136/70   Health Maintenance Due  Topic Date Due  . Hepatitis C Screening  1956-02-16  . HIV Screening  11/30/1971  . TETANUS/TDAP  11/30/1975  . INFLUENZA VACCINE  02/14/2018    There are no preventive care reminders to display for this patient.  Lab Results  Component Value Date   TSH 1.08 06/11/2017   Lab Results  Component Value Date   WBC 6.6 06/11/2017   HGB 14.3 06/11/2017   HCT 42.6 06/11/2017   MCV 90.7 06/11/2017   PLT 167.0 06/11/2017   Lab Results  Component Value Date   NA 140 09/18/2017   K 3.8 09/18/2017   CO2 31 09/18/2017   GLUCOSE 152 (H) 09/18/2017   BUN 20 09/18/2017   CREATININE 1.11 09/18/2017   BILITOT 0.4 09/18/2017   ALKPHOS 49 09/18/2017   AST 18 09/18/2017   ALT 21 09/18/2017   PROT 6.7 09/18/2017   ALBUMIN 4.2 09/18/2017   CALCIUM 9.6 09/18/2017   GFR 71.63 09/18/2017   Lab Results  Component Value Date   CHOL 150 09/18/2017   Lab Results  Component Value Date   HDL 44.30 09/18/2017   Lab  Results  Component Value Date  [place] 70 06/11/2017   Lab Results  Component Value Date   TRIG 227.0 (H) 09/18/2017   Lab Results  Component Value Date   CHOLHDL 3 09/18/2017   No results found for: HGBA1C    Assessment & Plan:   Problem List Items Addressed This Visit      Musculoskeletal and Integument   Achilles tendinitis of left lower extremity - Primary   Relevant Medications   diclofenac sodium (VOLTAREN) 1 % GEL     Other   Gout   Relevant Medications   colchicine 0.6 MG tablet      Meds ordered this encounter  Medications  . colchicine 0.6 MG tablet    Sig: Take 1 tablet (0.6 mg total) by mouth 2 (two) times daily. As needed for attacks.    Dispense:  180 tablet    Refill:  0  . diclofenac sodium (VOLTAREN) 1 % GEL    Sig: Apply 2 g topically 4 (four) times daily. As needed.    Dispense:  100 g    Refill:  1    Follow-up: Return if symptoms worsen or fail to improve.

## 2018-06-28 NOTE — Patient Instructions (Signed)
Tendinitis Tendinitis is inflammation of a tendon. A tendon is a strong cord of tissue that connects muscle to bone. Tendinitis can affect any tendon, but it most commonly affects the shoulder tendon (rotator cuff), ankle tendon (Achilles tendon), elbow tendon (triceps tendon), or one of the tendons in the wrist. What are the causes? This condition may be caused by:  Overusing a tendon or muscle. This is common.  Age-related wear and tear.  Injury.  Inflammatory conditions, such as arthritis.  Certain medicines.  What increases the risk? This condition is more likely to develop in people who do activities that involve repetitive motions. What are the signs or symptoms? Symptoms of this condition may include:  Pain.  Tenderness.  Mild swelling.  How is this diagnosed? This condition is diagnosed with a physical exam. You may also have tests, such as:  Ultrasound. This uses sound waves to make an image of your affected area.  MRI.  How is this treated? This condition may be treated by resting, icing, applying pressure (compression), and raising (elevating) the area above the level of your heart. This is known as RICE therapy. Treatment may also include:  Medicines to help reduce inflammation or to help reduce pain.  Exercises or physical therapy to strengthen and stretch the tendon.  A brace or splint.  Surgery (rare).  Follow these instructions at home:  If you have a splint or brace:  Wear the splint or brace as told by your health care provider. Remove it only as told by your health care provider.  Loosen the splint or brace if your fingers or toes tingle, become numb, or turn cold and blue.  Do not take baths, swim, or use a hot tub until your health care provider approves. Ask your health care provider if you can take showers. You may only be allowed to take sponge baths for bathing.  Do not let your splint or brace get wet if it is not waterproof. ? If your  splint or brace is not waterproof, cover it with a watertight plastic bag when you take a bath or a shower.  Keep the splint or brace clean. Managing pain, stiffness, and swelling  If directed, apply ice to the affected area. ? Put ice in a plastic bag. ? Place a towel between your skin and the bag. ? Leave the ice on for 20 minutes, 2-3 times a day.  If directed, apply heat to the affected area as often as told by your health care provider. Use the heat source that your health care provider recommends, such as a moist heat pack or a heating pad. ? Place a towel between your skin and the heat source. ? Leave the heat on for 20-30 minutes. ? Remove the heat if your skin turns bright red. This is especially important if you are unable to feel pain, heat, or cold. You may have a greater risk of getting burned.  Move the fingers or toes of the affected limb often, if this applies. This can help to prevent stiffness and lessen swelling.  If directed, elevate the affected area above the level of your heart while you are sitting or lying down. Driving  Do not drive or operate heavy machinery while taking prescription pain medicine.  Ask your health care provider when it is safe to drive if you have a splint or brace on any part of your arm or leg. Activity  Return to your normal activities as told by your health care   provider. Ask your health care provider what activities are safe for you.  Rest the affected area as told by your health care provider.  Avoid using the affected area while you are experiencing symptoms of tendinitis.  Do exercises as told by your health care provider. General instructions  If you have a splint, do not put pressure on any part of the splint until it is fully hardened. This may take several hours.  Wear an elastic bandage or compression wrap only as told by your health care provider.  Take over-the-counter and prescription medicines only as told by your  health care provider.  Keep all follow-up visits as told by your health care provider. This is important. Contact a health care provider if:  Your symptoms do not improve.  You develop new, unexplained problems, such as numbness in your hands. This information is not intended to replace advice given to you by your health care provider. Make sure you discuss any questions you have with your health care provider. Document Released: 06/30/2000 Document Revised: 03/02/2016 Document Reviewed: 04/05/2015 Elsevier Interactive Patient Education  2018 [employer].  Achilles Tendinitis Achilles tendinitis is inflammation of the tough, cord-like band that attaches the lower leg muscles to the heel bone (Achilles tendon). This is usually caused by overusing the tendon and the ankle joint. Achilles tendinitis usually gets better over time with treatment and caring for yourself at home. It can take weeks or months to heal completely. What are the causes? This condition may be caused by:  A sudden increase in exercise or activity, such as running.  Doing the same exercises or activities (such as jumping) over and over.  Not warming up calf muscles before exercising.  Exercising in shoes that are worn out or not made for exercise.  Having arthritis or a bone growth (spur) on the back of the heel bone. This can rub against the tendon and hurt it.  Age-related wear and tear. Tendons become less flexible with age and more likely to be injured.  What are the signs or symptoms? Common symptoms of this condition include:  Pain in the Achilles tendon or in the back of the leg, just above the heel. The pain usually gets worse with exercise.  Stiffness or soreness in the back of the leg, especially in the morning.  Swelling of the skin over the Achilles tendon.  Thickening of the tendon.  Bone spurs at the bottom of the Achilles tendon, near the heel.  Trouble standing on tiptoe.  How is this  diagnosed? This condition is diagnosed based on your symptoms and a physical exam. You may have tests, including:  X-rays.  MRI.  How is this treated? The goal of treatment is to relieve symptoms and help your injury heal. Treatment may include:  Decreasing or stopping activities that caused the tendinitis. This may mean switching to low-impact exercises like biking or swimming.  Icing the injured area.  Doing physical therapy, including strengthening and stretching exercises.  NSAIDs to help relieve pain and swelling.  Using supportive shoes, wraps, heel lifts, or a walking boot (air cast).  Surgery. This may be done if your symptoms do not improve after 6 months.  Using high-energy shock wave impulses to stimulate the healing process (extracorporeal shock wave therapy). This is rare.  Injection of medicines to help relieve inflammation (corticosteroids). This is rare.  Follow these instructions at home: If you have an air cast:  Wear the cast as told by your health care  provider. Remove it only as told by your health care provider.  Loosen the cast if your toes tingle, become numb, or turn cold and blue. Activity  Gradually return to your normal activities once your health care provider approves. Do not do activities that cause pain. ? Consider doing low-impact exercises, like cycling or swimming.  If you have an air cast, ask your health care provider when it is safe for you to drive.  If physical therapy was prescribed, do exercises as told by your health care provider or physical therapist. Managing pain, stiffness, and swelling  Raise (elevate) your foot above the level of your heart while you are sitting or lying down.  Move your toes often to avoid stiffness and to lessen swelling.  If directed, put ice on the injured area: ? Put ice in a plastic bag. ? Place a towel between your skin and the bag. ? Leave the ice on for 20 minutes, 2-3 times a day General  instructions  If directed, wrap your foot with an elastic bandage or other wrap. This can help keep your tendon from moving too much while it heals. Your health care provider will show you how to wrap your foot correctly.  Wear supportive shoes or heel lifts only as told by your health care provider.  Take over-the-counter and prescription medicines only as told by your health care provider.  Keep all follow-up visits as told by your health care provider. This is important. Contact a health care provider if:  You have symptoms that gets worse.  You have pain that does not get better with medicine.  You develop new, unexplained symptoms.  You develop warmth and swelling in your foot.  You have a fever. Get help right away if:  You have a sudden popping sound or sensation in your Achilles tendon followed by severe pain.  You cannot move your toes or foot.  You cannot put any weight on your foot. Summary  Achilles tendinitis is inflammation of the tough, cord-like band that attaches the lower leg muscles to the heel bone (Achilles tendon).  This condition is usually caused by overusing the tendon and the ankle joint. It can also be caused by arthritis or normal aging.  The most common symptoms of this condition include pain, swelling, or stiffness in the Achilles tendon or in the back of the leg.  This condition is usually treated with rest, NSAIDs, and physical therapy. This information is not intended to replace advice given to you by your health care provider. Make sure you discuss any questions you have with your health care provider. Document Released: 04/12/2005 Document Revised: 05/22/2016 Document Reviewed: 05/22/2016 Elsevier Interactive Patient Education  2017 [employer].

## 2018-07-01 ENCOUNTER — Telehealth: Payer: Self-pay

## 2018-07-01 NOTE — Telephone Encounter (Signed)
I called and spoke with patient. Per Dr. Ethelene Hal it is okay for him to return to work. He verbalized understanding.     Copied from [place] [id number]. Topic: General - Other >> Jul 01, 2018  8:19 AM Parke Poisson wrote: Reason for CRM: Pt states that he was seen 06/28/18 for left heel pain.He states that it is feeling better and would like the approval to return to work.

## 2018-08-02 ENCOUNTER — Telehealth: Payer: Self-pay | Admitting: Family Medicine

## 2018-08-02 NOTE — Telephone Encounter (Signed)
Patient came in and dropped off two forms that needs to be filled out. The first form is about getting reimbursed for a trip that he didn't go on. The other form is with his ins stating that he has had a physical in the pass year. Please call patient once forms are done

## 2018-08-02 NOTE — Telephone Encounter (Signed)
Forms completed & placed on provider's desk to be signed.

## 2018-08-02 NOTE — Telephone Encounter (Signed)
I called and left patient a voicemail letting him know that both forms are completed & up front for pick up.

## 2018-08-27 DIAGNOSIS — Z713 Dietary counseling and surveillance: Secondary | ICD-10-CM | POA: Diagnosis not present

## 2018-09-25 DIAGNOSIS — Z713 Dietary counseling and surveillance: Secondary | ICD-10-CM | POA: Diagnosis not present

## 2018-11-27 ENCOUNTER — Other Ambulatory Visit: Payer: Self-pay | Admitting: Family Medicine

## 2018-11-27 DIAGNOSIS — M109 Gout, unspecified: Secondary | ICD-10-CM

## 2018-11-27 DIAGNOSIS — E7800 Pure hypercholesterolemia, unspecified: Secondary | ICD-10-CM

## 2018-12-12 DIAGNOSIS — Z713 Dietary counseling and surveillance: Secondary | ICD-10-CM | POA: Diagnosis not present

## 2018-12-28 ENCOUNTER — Other Ambulatory Visit: Payer: Self-pay | Admitting: Family Medicine

## 2018-12-28 DIAGNOSIS — I10 Essential (primary) hypertension: Secondary | ICD-10-CM

## 2018-12-30 ENCOUNTER — Other Ambulatory Visit: Payer: BC Managed Care – PPO

## 2018-12-30 ENCOUNTER — Encounter: Payer: Self-pay | Admitting: Family Medicine

## 2018-12-30 ENCOUNTER — Ambulatory Visit (INDEPENDENT_AMBULATORY_CARE_PROVIDER_SITE_OTHER): Payer: BC Managed Care – PPO | Admitting: Family Medicine

## 2018-12-30 ENCOUNTER — Telehealth: Payer: Self-pay

## 2018-12-30 DIAGNOSIS — R1013 Epigastric pain: Secondary | ICD-10-CM

## 2018-12-30 LAB — URINALYSIS, ROUTINE W REFLEX MICROSCOPIC
Bilirubin Urine: NEGATIVE
Ketones, ur: NEGATIVE
Leukocytes,Ua: NEGATIVE
Nitrite: NEGATIVE
Specific Gravity, Urine: 1.02 (ref 1.000–1.030)
Urine Glucose: NEGATIVE
Urobilinogen, UA: 0.2 (ref 0.0–1.0)
pH: 6.5 (ref 5.0–8.0)

## 2018-12-30 LAB — COMPREHENSIVE METABOLIC PANEL
ALT: 18 U/L (ref 0–53)
AST: 16 U/L (ref 0–37)
Albumin: 4.4 g/dL (ref 3.5–5.2)
Alkaline Phosphatase: 56 U/L (ref 39–117)
BUN: 12 mg/dL (ref 6–23)
CO2: 28 mEq/L (ref 19–32)
Calcium: 9.6 mg/dL (ref 8.4–10.5)
Chloride: 102 mEq/L (ref 96–112)
Creatinine, Ser: 0.87 mg/dL (ref 0.40–1.50)
GFR: 88.89 mL/min (ref >60.00)
Glucose, Bld: 103 mg/dL — ABNORMAL HIGH (ref 70–99)
Potassium: 4.1 mEq/L (ref 3.5–5.1)
Sodium: 140 mEq/L (ref 135–145)
Total Bilirubin: 0.6 mg/dL (ref 0.2–1.2)
Total Protein: 6.7 g/dL (ref 6.0–8.3)

## 2018-12-30 LAB — CBC
HCT: 42.9 % (ref 39.0–52.0)
Hemoglobin: 14.5 g/dL (ref 13.0–17.0)
MCHC: 33.8 g/dL (ref 30.0–36.0)
MCV: 92.1 fl (ref 78.0–100.0)
Platelets: 142 10*3/uL — ABNORMAL LOW (ref 150.0–400.0)
RBC: 4.66 Mil/uL (ref 4.22–5.81)
RDW: 13.6 % (ref 11.5–15.5)
WBC: 10 10*3/uL (ref 4.0–10.5)

## 2018-12-30 LAB — LIPASE: Lipase: 3258 U/L — ABNORMAL HIGH (ref 11.0–59.0)

## 2018-12-30 LAB — AMYLASE: Amylase: 621 U/L — ABNORMAL HIGH (ref 27–131)

## 2018-12-30 LAB — GAMMA GT: GGT: 17 U/L (ref 7–51)

## 2018-12-30 MED ORDER — CIMETIDINE 200 MG PO TABS: 200.0000 mg | ORAL_TABLET | Freq: Two times a day (BID) | ORAL | 0 refills | Status: DC

## 2018-12-30 NOTE — Telephone Encounter (Signed)

## 2018-12-30 NOTE — Progress Notes (Signed)
Established Patient Office Visit  Subjective:  Patient ID: Eric Salas, male    DOB: 1955-08-27  Age: 63 y.o. MRN: [id number]  CC:  Chief Complaint  Patient presents with  . pain in sternum    started Friday after dinner and went on into Saturday & Sunday. No chest pain, hurts to take a breath.    HPI Eric Salas presents for treatment and evaluation of a 3-day history of acute midepigastric pain that is radiating to the left.  Ingestion of solids and liquids seem to exacerbate.  One type of food seems to make no difference over another.  Denies a history of indigestion reflux nausea or vomiting.  Denies shortness of breath cough diaphoresis or any exertional component.  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  Denies recent increase use of the Indocin that he uses for gout.  No family history of gallstones.  No recent change in weight.  Denies hematuria or blood in his stool or melena.  Past Medical History:  Diagnosis Date  . Arthritis   . Cataract    lt. eye beginning stage  . Hyperlipidemia   . Hypertension     Past Surgical History:  Procedure Laterality Date  . COLONOSCOPY      Family History  Problem Relation Age of Onset  . Cancer Mother   . Miscarriages / [place] Mother   . Alcohol abuse Father   . Diabetes Father   . Heart attack Father   . Heart disease Father   . High Cholesterol Father   . High blood pressure Father   . Stroke Father   . Diabetes Brother   . Hearing loss Brother   . High Cholesterol Brother   . Colon cancer Brother   . Cancer Brother   . Diabetes Brother   . Drug abuse Brother   . High Cholesterol Brother   . Stomach cancer Brother   . Rectal cancer Neg Hx   . Esophageal cancer Neg Hx   . Pancreatic cancer Neg Hx   . Liver cancer Neg Hx   . Prostate cancer Neg Hx     Social History   Socioeconomic History  . Marital status: Married    Spouse name: Not on file  . Number of children: Not on file  . Years of  education: Not on file  . Highest education level: Not on file  Occupational History  . Not on file  Social Needs  . Financial resource strain: Not hard at all  . Food insecurity    Worry: Never true    Inability: Never true  . Transportation needs    Medical: Not on file    Non-medical: Not on file  Tobacco Use  . Smoking status: Former Smoker    Types: Cigarettes    Quit date: 10/18/2005    Years since quitting: 13.2  . Smokeless tobacco: Never Used  Substance and Sexual Activity  . Alcohol use: Not Currently    Frequency: Never    Comment: quit 15 years ago  . Drug use: No  . Sexual activity: Yes    Partners: Female  Lifestyle  . Physical activity    Days per week: Not on file    Minutes per session: Not on file  . Stress: Not on file  Relationships  . Social Herbalist on phone: Not on file    Gets together: Not on file    Attends religious service: Not  on file    Active member of club or organization: Not on file    Attends meetings of clubs or organizations: Not on file    Relationship status: Not on file  . Intimate partner violence    Fear of current or ex partner: Not on file    Emotionally abused: Not on file    Physically abused: Not on file    Forced sexual activity: Not on file  Other Topics Concern  . Not on file  Social History Narrative  . Not on file    Outpatient Medications Prior to Visit  Medication Sig Dispense Refill  . allopurinol (ZYLOPRIM) 100 MG tablet Take 1 tablet (100 mg total) by mouth daily. 90 tablet 1  . colchicine 0.6 MG tablet Take 1 tablet (0.6 mg total) by mouth 2 (two) times daily. As needed for attacks. 180 tablet 0  . diclofenac sodium (VOLTAREN) 1 % GEL Apply 2 g topically 4 (four) times daily. As needed. 100 g 1  . indomethacin (INDOCIN) 50 MG capsule TAKE 1 CAPSULE BY MOUTH THREE TIMES DAILY WITH MEALS AS NEEDED FOR  GOUTY  FLAIRS 120 capsule 0  . irbesartan (AVAPRO) 150 MG tablet Take 1 tablet by mouth once  daily 90 tablet 0  . rosuvastatin (CRESTOR) 20 MG tablet TAKE 1 TABLET BY MOUTH EVERY OTHER DAY 90 tablet 0   No facility-administered medications prior to visit.     Allergies  Allergen Reactions  . Codeine Itching    ROS Review of Systems  Constitutional: Negative for chills, diaphoresis, fatigue, fever and unexpected weight change.  HENT: Negative.   Respiratory: Negative.  Negative for chest tightness, shortness of breath and wheezing.   Cardiovascular: Negative for chest pain.  Gastrointestinal: Positive for abdominal distention and abdominal pain. Negative for anal bleeding, blood in stool, constipation, diarrhea, nausea and vomiting.  Genitourinary: Negative.   Musculoskeletal: Negative.   Skin: Negative for pallor and rash.  Allergic/Immunologic: Negative for immunocompromised state.  Hematological: Does not bruise/bleed easily.  Psychiatric/Behavioral: Negative.       Objective:    Physical Exam  Constitutional: He is oriented to person, place, and time. He appears well-developed and well-nourished. No distress.  HENT:  Head: Normocephalic and atraumatic.  Right Ear: External ear normal.  Left Ear: External ear normal.  Eyes: Conjunctivae are normal. Right eye exhibits no discharge. Left eye exhibits no discharge. No scleral icterus.  Neck: No JVD present. No tracheal deviation present.  Pulmonary/Chest: Effort normal. No stridor.  Neurological: He is alert and oriented to person, place, and time.  Skin: He is not diaphoretic.  Psychiatric: He has a normal mood and affect. His behavior is normal.    There were no vitals taken for this visit. Wt Readings from Last 3 Encounters:  06/28/18 225 lb (102.1 kg)  11/01/17 227 lb (103 kg)  10/18/17 221 lb 6.4 oz (100.4 kg)   BP Readings from Last 3 Encounters:  06/28/18 132/80  11/01/17 (!) 143/87  09/18/17 136/70   Guideline developer:  UpToDate (see UpToDate for funding source) Date Released: June 2014   Health Maintenance Due  Topic Date Due  . Hepatitis C Screening  May [birth year], 1956  . HIV Screening  11/30/1971  . TETANUS/TDAP  11/30/1975    There are no preventive care reminders to display for this patient.  Lab Results  Component Value Date   TSH 1.08 06/11/2017   Lab Results  Component Value Date   WBC 6.6 06/11/2017   HGB  14.3 06/11/2017   HCT 42.6 06/11/2017   MCV 90.7 06/11/2017   PLT 167.0 06/11/2017   Lab Results  Component Value Date   NA 140 09/18/2017   K 3.8 09/18/2017   CO2 31 09/18/2017   GLUCOSE 152 (H) 09/18/2017   BUN 20 09/18/2017   CREATININE 1.11 09/18/2017   BILITOT 0.4 09/18/2017   ALKPHOS 49 09/18/2017   AST 18 09/18/2017   ALT 21 09/18/2017   PROT 6.7 09/18/2017   ALBUMIN 4.2 09/18/2017   CALCIUM 9.6 09/18/2017   GFR 71.63 09/18/2017   Lab Results  Component Value Date   CHOL 150 09/18/2017   Lab Results  Component Value Date   HDL 44.30 09/18/2017   Lab Results  Component Value Date   LDLCALC 70 06/11/2017   Lab Results  Component Value Date   TRIG 227.0 (H) 09/18/2017   Lab Results  Component Value Date   CHOLHDL 3 09/18/2017   No results found for: HGBA1C    Assessment & Plan:   Problem List Items Addressed This Visit    None    Visit Diagnoses    Epigastric pain    -  Primary   Relevant Medications   cimetidine (TAGAMET HB) 200 MG tablet   Other Relevant Orders   CBC   Comprehensive metabolic panel   Amylase   Gamma GT   Urinalysis, Routine w reflex microscopic   Lipase   US Abdomen Complete      Meds ordered this encounter  Medications  . cimetidine (TAGAMET HB) 200 MG tablet    Sig: Take 1 tablet (200 mg total) by mouth 2 (two) times daily for 30 days.    Dispense:  60 tablet    Refill:  0    Follow-up: No follow-ups on file.    Virtual Visit via Video Note  I connected with Anthonie Lotito Rekowski on 12/30/18 at  9:00 AM EDT by a video enabled telemedicine application and verified that I am speaking  with the correct person using two identifiers.  Location: Patient: home Provider:    I discussed the limitations of evaluation and management by telemedicine and the availability of in person appointments. The patient expressed understanding and agreed to proceed.  History of Present Illness:    Observations/Objective:   Assessment and Plan:   Follow Up Instructions:    I discussed the assessment and treatment plan with the patient. The patient was provided an opportunity to ask questions and all were answered. The patient agreed with the plan and demonstrated an understanding of the instructions.   The patient was advised to call back or seek an in-person evaluation if the symptoms worsen or if the condition fails to improve as anticipated.  I provided 22 minutes of non-face-to-face time during this encounter.   Libby Maw, MD  Patient has a face-to-face appointment scheduled for tomorrow.  He will come in this morning for blood work.

## 2018-12-31 ENCOUNTER — Encounter: Payer: Self-pay | Admitting: Family Medicine

## 2018-12-31 ENCOUNTER — Ambulatory Visit (HOSPITAL_BASED_OUTPATIENT_CLINIC_OR_DEPARTMENT_OTHER)
Admission: RE | Admit: 2018-12-31 | Discharge: 2018-12-31 | Disposition: A | Discharge status: Home / Self Care | Payer: BC Managed Care – PPO | Source: Ambulatory Visit | Attending: Family Medicine | Admitting: Family Medicine

## 2018-12-31 ENCOUNTER — Telehealth: Payer: Self-pay | Admitting: Gastroenterology

## 2018-12-31 ENCOUNTER — Other Ambulatory Visit: Payer: Self-pay

## 2018-12-31 ENCOUNTER — Ambulatory Visit: Payer: BC Managed Care – PPO | Admitting: Family Medicine

## 2018-12-31 VITALS — BP 138/80 | HR 71 | Temp 98.0°F | Ht 70.0 in | Wt 222.0 lb

## 2018-12-31 DIAGNOSIS — K859 Acute pancreatitis without necrosis or infection, unspecified: Secondary | ICD-10-CM | POA: Diagnosis not present

## 2018-12-31 DIAGNOSIS — R1013 Epigastric pain: Secondary | ICD-10-CM | POA: Insufficient documentation

## 2018-12-31 DIAGNOSIS — K858 Other acute pancreatitis without necrosis or infection: Secondary | ICD-10-CM

## 2018-12-31 DIAGNOSIS — K760 Fatty (change of) liver, not elsewhere classified: Secondary | ICD-10-CM | POA: Diagnosis not present

## 2018-12-31 DIAGNOSIS — N281 Cyst of kidney, acquired: Secondary | ICD-10-CM | POA: Diagnosis not present

## 2018-12-31 NOTE — Progress Notes (Addendum)
Established Patient Office Visit  Subjective:  Patient ID: Eric Salas, male    DOB: 08/15/1955  Age: 63 y.o. MRN: [id number]  CC:  Chief Complaint  Patient presents with  . Follow-up    HPI Eric Salas presents for follow-up of pain in his midepigastrium.  The pain comes and goes and is intense at times.  The pain is centrally located and sometimes radiates to the left.  Some nausea without vomiting.  Stools have been dark.  Urine has been somewhat concentrated.  Denies right upper quadrant pain, jaundice or pain in the right shoulder.  Sober for 15 years.  Denies increased intake of fatty foods weight has been stable.  Last triglycerides were 227.  He is no longer taking allopurinol.  Uses colchicine and indomethacin as needed for gouty attacks.  Past Medical History:  Diagnosis Date  . Arthritis   . Cataract    lt. eye beginning stage  . Hyperlipidemia   . Hypertension     Past Surgical History:  Procedure Laterality Date  . COLONOSCOPY      Family History  Problem Relation Age of Onset  . Cancer Mother   . Miscarriages / [place] Mother   . Alcohol abuse Father   . Diabetes Father   . Heart attack Father   . Heart disease Father   . High Cholesterol Father   . High blood pressure Father   . Stroke Father   . Diabetes Brother   . Hearing loss Brother   . High Cholesterol Brother   . Colon cancer Brother   . Cancer Brother   . Diabetes Brother   . Drug abuse Brother   . High Cholesterol Brother   . Stomach cancer Brother   . Rectal cancer Neg Hx   . Esophageal cancer Neg Hx   . Pancreatic cancer Neg Hx   . Liver cancer Neg Hx   . Prostate cancer Neg Hx     Social History   Socioeconomic History  . Marital status: Married    Spouse name: Not on file  . Number of children: Not on file  . Years of education: Not on file  . Highest education level: Not on file  Occupational History  . Not on file  Social Needs  . Financial resource  strain: Not hard at all  . Food insecurity    Worry: Never true    Inability: Never true  . Transportation needs    Medical: Not on file    Non-medical: Not on file  Tobacco Use  . Smoking status: Former Smoker    Types: Cigarettes    Quit date: 10/18/2005    Years since quitting: 13.2  . Smokeless tobacco: Never Used  Substance and Sexual Activity  . Alcohol use: Not Currently    Frequency: Never    Comment: quit 15 years ago  . Drug use: No  . Sexual activity: Yes    Partners: Female  Lifestyle  . Physical activity    Days per week: Not on file    Minutes per session: Not on file  . Stress: Not on file  Relationships  . Social Herbalist on phone: Not on file    Gets together: Not on file    Attends religious service: Not on file    Active member of club or organization: Not on file    Attends meetings of clubs or organizations: Not on file    Relationship status:  Not on file  . Intimate partner violence    Fear of current or ex partner: Not on file    Emotionally abused: Not on file    Physically abused: Not on file    Forced sexual activity: Not on file  Other Topics Concern  . Not on file  Social History Narrative  . Not on file    Outpatient Medications Prior to Visit  Medication Sig Dispense Refill  . allopurinol (ZYLOPRIM) 100 MG tablet Take 1 tablet (100 mg total) by mouth daily. 90 tablet 1  . cimetidine (TAGAMET HB) 200 MG tablet Take 1 tablet (200 mg total) by mouth 2 (two) times daily for 30 days. 60 tablet 0  . colchicine 0.6 MG tablet Take 1 tablet (0.6 mg total) by mouth 2 (two) times daily. As needed for attacks. 180 tablet 0  . diclofenac sodium (VOLTAREN) 1 % GEL Apply 2 g topically 4 (four) times daily. As needed. 100 g 1  . indomethacin (INDOCIN) 50 MG capsule TAKE 1 CAPSULE BY MOUTH THREE TIMES DAILY WITH MEALS AS NEEDED FOR  GOUTY  FLAIRS 120 capsule 0  . irbesartan (AVAPRO) 150 MG tablet Take 1 tablet by mouth once daily 90 tablet 0   . rosuvastatin (CRESTOR) 20 MG tablet TAKE 1 TABLET BY MOUTH EVERY OTHER DAY 90 tablet 0   No facility-administered medications prior to visit.     Allergies  Allergen Reactions  . Codeine Itching    ROS Review of Systems  Constitutional: Positive for appetite change. Negative for chills, diaphoresis, fatigue, fever and unexpected weight change.  Respiratory: Negative.   Cardiovascular: Negative.   Gastrointestinal: Positive for abdominal distention, abdominal pain and nausea. Negative for blood in stool, constipation, diarrhea and vomiting.  Genitourinary: Negative.   Musculoskeletal: Negative for arthralgias and joint swelling.  Skin: Negative for color change.  Allergic/Immunologic: Negative for immunocompromised state.  Neurological: Negative for weakness.  Hematological: Does not bruise/bleed easily.  Psychiatric/Behavioral: Negative.       Objective:    Physical Exam  Constitutional: He is oriented to person, place, and time. He appears well-developed and well-nourished. No distress.  HENT:  Head: Normocephalic and atraumatic.  Right Ear: External ear normal.  Left Ear: External ear normal.  Mouth/Throat: Oropharynx is clear and moist. No oropharyngeal exudate.  Eyes: Pupils are equal, round, and reactive to light. Conjunctivae are normal. Right eye exhibits no discharge. Left eye exhibits no discharge. No scleral icterus.  Neck: Neck supple. No JVD present. No tracheal deviation present. No thyromegaly present.  Cardiovascular: Normal rate, regular rhythm and normal heart sounds.  Pulmonary/Chest: Effort normal and breath sounds normal.  Abdominal: Bowel sounds are normal. He exhibits no distension. There is no abdominal tenderness. There is no rebound and no guarding.  Musculoskeletal:        General: No edema.  Lymphadenopathy:    He has no cervical adenopathy.  Neurological: He is alert and oriented to person, place, and time.  Skin: Skin is warm and dry. He is  not diaphoretic. No pallor.  Psychiatric: He has a normal mood and affect. His behavior is normal.    BP 138/80   Pulse 71   Temp 98 F (36.7 C) (Oral)   Ht 5\' 10"  (1.778 m)   Wt 222 lb (100.7 kg)   SpO2 95%   BMI 31.85 kg/m  Wt Readings from Last 3 Encounters:  12/31/18 222 lb (100.7 kg)  06/28/18 225 lb (102.1 kg)  11/01/17 227 lb (  103 kg)   BP Readings from Last 3 Encounters:  12/31/18 138/80  06/28/18 132/80  11/01/17 (!) 143/87   Guideline developer:  UpToDate (see UpToDate for funding source) Date Released: June 2014  Health Maintenance Due  Topic Date Due  . Hepatitis C Screening  07/07/57  . HIV Screening  11/30/1971  . TETANUS/TDAP  11/30/1975    There are no preventive care reminders to display for this patient.  Lab Results  Component Value Date   TSH 1.08 06/11/2017   Lab Results  Component Value Date   WBC 10.0 12/30/2018   HGB 14.5 12/30/2018   HCT 42.9 12/30/2018   MCV 92.1 12/30/2018   PLT 142.0 (L) 12/30/2018   Lab Results  Component Value Date   NA 140 12/30/2018   K 4.1 12/30/2018   CO2 28 12/30/2018   GLUCOSE 103 (H) 12/30/2018   BUN 12 12/30/2018   CREATININE 0.87 12/30/2018   BILITOT 0.6 12/30/2018   ALKPHOS 56 12/30/2018   AST 16 12/30/2018   ALT 18 12/30/2018   PROT 6.7 12/30/2018   ALBUMIN 4.4 12/30/2018   CALCIUM 9.6 12/30/2018   GFR 88.89 12/30/2018   Lab Results  Component Value Date   CHOL 150 09/18/2017   Lab Results  Component Value Date   HDL 44.30 09/18/2017   Lab Results  Component Value Date   LDLCALC 70 06/11/2017   Lab Results  Component Value Date   TRIG 227.0 (H) 09/18/2017   Lab Results  Component Value Date   CHOLHDL 3 09/18/2017   No results found for: HGBA1C    Assessment & Plan:   Problem List Items Addressed This Visit      Digestive   Acute pancreatitis - Primary   Relevant Medications   traMADol (ULTRAM) 50 MG tablet   Other Relevant Orders   MR Abdomen W Wo Contrast   MR  Abdomen W Wo Contrast      Meds ordered this encounter  Medications  . traMADol (ULTRAM) 50 MG tablet    Sig: Take 1 tablet (50 mg total) by mouth every 6 (six) hours as needed for up to 7 days.    Dispense:  30 tablet    Refill:  0    Follow-up: Return in about 3 days (around 01/03/2019).   Patient will also be following up with GI in the next few days.  He was advised to maintain a liquid diet only and focusing on staying hydrated with Gatorade for example.  Ultrasound is this afternoon he will follow-up here if not seen elsewhere in the next few days.  He knows to go to the emergency room if his condition worsens at all.  6/17: Addendum: Abdominal ultrasound showed fatty infiltration of the liver with a questionable echogenic mass.  Patient's liver enzymes have been normal.  Pancreas was partially visualized and found to be normal.  Patient's pain has increased.  He continues to be able to hydrate and has been drinking copious quantities of propel and Gatorade.  He is avoiding all solid foods.  Video conference is scheduled with gastroenterology tomorrow at 330.  Have sent in Ultram 50 mg every 6 PRN.  Have ordered abdominal MRI.  As long as he is able to hydrate him the Ultram helps to control his pain he may stay at home.  He will go to the hospital if anything worsens.

## 2018-12-31 NOTE — Telephone Encounter (Signed)
Appointment for a telemedicine has been scheduled for 01/02/19 at 2:20pm with Dr Lyndel Safe.

## 2018-12-31 NOTE — Telephone Encounter (Signed)
I'm doc of the day and just spoke with Dr. Ethelene Hal (PCP)  It sounds like Eric Salas has relatively mild acute pancreatitis.  CBC and complete metabolic profile yesterday were both normal.  He is able to eat and does not require significant pain medicines.  He is going to encourage him to take in a lot of fluids, stay hydrated.  He is getting an ultrasound sometime this afternoon.   Claiborne Billings, He needs office appointment with Dr. Lyndel Safe sometime in the next couple days to see how he is doing, telemedicine is probably fine.  If the patient insists on in person visit and Dr. Lyndel Safe is okay with that that would be fine too.  Thanks  [place], [place]

## 2019-01-01 ENCOUNTER — Encounter: Payer: Self-pay | Admitting: Family Medicine

## 2019-01-01 ENCOUNTER — Telehealth: Payer: Self-pay

## 2019-01-01 MED ORDER — TRAMADOL HCL 50 MG PO TABS: 50.0000 mg | ORAL_TABLET | Freq: Four times a day (QID) | ORAL | 0 refills | Status: AC | PRN

## 2019-01-01 NOTE — Telephone Encounter (Signed)
Thanks for letting me know RG 

## 2019-01-01 NOTE — Telephone Encounter (Signed)
Copied from [place] [id number]. Topic: General - Other >> Jan 01, 2019  8:37 AM Carolyn Stare wrote: Pt call to say he was offered pain medicine and he turned it down and he is calling today to say he does need the pain medication  [place]

## 2019-01-01 NOTE — Addendum Note (Signed)
Addended by: Jon Billings on: 01/01/2019 09:46 AM   Modules accepted: Orders, Level of Service

## 2019-01-02 ENCOUNTER — Encounter: Payer: Self-pay | Admitting: Gastroenterology

## 2019-01-02 ENCOUNTER — Telehealth (INDEPENDENT_AMBULATORY_CARE_PROVIDER_SITE_OTHER): Payer: BC Managed Care – PPO | Admitting: Gastroenterology

## 2019-01-02 ENCOUNTER — Other Ambulatory Visit: Payer: Self-pay

## 2019-01-02 VITALS — Ht 70.0 in | Wt 210.0 lb

## 2019-01-02 DIAGNOSIS — K760 Fatty (change of) liver, not elsewhere classified: Secondary | ICD-10-CM | POA: Insufficient documentation

## 2019-01-02 DIAGNOSIS — R932 Abnormal findings on diagnostic imaging of liver and biliary tract: Secondary | ICD-10-CM

## 2019-01-02 DIAGNOSIS — K859 Acute pancreatitis without necrosis or infection, unspecified: Secondary | ICD-10-CM | POA: Diagnosis not present

## 2019-01-02 MED ORDER — TRAMADOL HCL 50 MG PO TABS: 50.0000 mg | ORAL_TABLET | Freq: Three times a day (TID) | ORAL | 0 refills | Status: DC | PRN

## 2019-01-02 NOTE — Patient Instructions (Signed)
If you are age 62 or older, your body mass index should be between 23-30. Your Body mass index is 30.13 kg/m. If this is out of the aforementioned range listed, please consider follow up with your Primary Care Provider.  If you are age 6 or younger, your body mass index should be between 19-25. Your Body mass index is 30.13 kg/m. If this is out of the aformentioned range listed, please consider follow up with your Primary Care Provider.   Your provider has requested that you go to the basement level for lab work at [facility] in [place] ([place] Press "B" on the elevator. The lab is located at the first door on the left as you exit the elevator.   We have sent the following medications to your pharmacy for you to pick up at your convenience: Ultram  You have been scheduled for an MRI at [facility] on 01/08/19. Your appointment time is 8am. Please arrive 15 minutes prior to your appointment time for registration purposes. Please make certain not to have anything to eat or drink 6 hours prior to your test. In addition, if you have any metal in your body, have a pacemaker or defibrillator, please be sure to let your ordering physician know. This test typically takes 45 minutes to 1 hour to complete. Should you need to reschedule, please call [phone] to do so.  Thank you,  Dr. Jackquline Denmark

## 2019-01-02 NOTE — Progress Notes (Signed)
Chief Complaint:   Referring Provider:  Libby Maw,*      ASSESSMENT AND PLAN;   #1. Acute Pancreatitis. ?etiology. No alcohol, gallstones, abn LFTs, trauma, FH of pancreatitis, cocaine use, use of medications (except for ACE) associated with pancreatitis, hypercalcemia, intake of any herbal medications or previous history of pancreatitis. Had borderline triglycerides in past.  #2. AbN US showing ? 5.5 cm R hepatic lesion 01/01/2019. Nl pancreas.  Plan: - Check CBC, CMP, amylase, lipase, fasting lipid profile, IgG4, CA19-9, CEA, AFP. - Ultram 50mg  Q8hrs prn  #30 - MRI with/without contrast with MRCP ASAP.  Scheduled for 01/08/2019. - Full liquid diet today.  Can advance to low-fat diet in a.m. - FU in 2 weeks. - Discussed in detail with the patient and patient's wife.   HPI:    Eric Salas is a 63 y.o. male  Known to me from screening colonoscopy on 11/01/2017  With sudden onset of epigastric abd pain x 6 days after eating, radiating to the back.  Found to have acute pancreatitis with elevated amylase and lipase as below.  No nausea or vomiting.  Currently feels much better.  He would only rarely use Ultram.  Just a few left.  Similar episode 6 months ago lasting for a day.  No alcohol for last 15 years.  No heartburn (occ with lemonade), regurgitation, odynophagia or dysphagia.  No significant diarrhea or constipation.  There is no melena or hematochezia. No unintentional weight loss.  No fever or chills.  Did get hot while gardening transiently.  No history of itching, skin lesions, easy bruisability, intake of over-the-counter medications including diet pills, herbal medications, anabolic steroids or Tylenol.  There is no history of blood transfusions, IV drug use or family history of pancreatitis.  No jaundice dark urine or pale stools.   Had colon Cl[place] in 09/2018.  Otherwise no herbal medicines.  Currently on full liq diet.  Pain-free.    Past  GI procedures: Colonoscopy.  11/01/2017 small colonic polyp status post polypectomy, mild left colonic diverticulosis.  Biopsies tubular adenoma.  Has positive family history of colon cancer in brother for age of 37. Rpt in 5 yrs. Past Medical History:  Diagnosis Date   Arthritis    Cataract    lt. eye beginning stage   Hyperlipidemia    Hypertension     Past Surgical History:  Procedure Laterality Date   COLONOSCOPY      Family History  Problem Relation Age of Onset   Cancer Mother    75 / [place] Mother    Alcohol abuse Father    Diabetes Father    Heart attack Father    Heart disease Father    High Cholesterol Father    High blood pressure Father    Stroke Father    Diabetes Brother    Hearing loss Brother    High Cholesterol Brother    Colon cancer Brother    Cancer Brother    Diabetes Brother    Drug abuse Brother    High Cholesterol Brother    Stomach cancer Brother    Rectal cancer Neg Hx    Esophageal cancer Neg Hx    Pancreatic cancer Neg Hx    Liver cancer Neg Hx    Prostate cancer Neg Hx     Social History   Tobacco Use   Smoking status: Former Smoker    Types: Cigarettes    Quit date: 10/18/2005    Years since  quitting: 13.2   Smokeless tobacco: Never Used  Substance Use Topics   Alcohol use: Not Currently    Frequency: Never    Comment: quit 15 years ago   Drug use: No    Current Outpatient Medications  Medication Sig Dispense Refill   allopurinol (ZYLOPRIM) 100 MG tablet Take 1 tablet (100 mg total) by mouth daily. 90 tablet 1   cimetidine (TAGAMET HB) 200 MG tablet Take 1 tablet (200 mg total) by mouth 2 (two) times daily for 30 days. 60 tablet 0   colchicine 0.6 MG tablet Take 1 tablet (0.6 mg total) by mouth 2 (two) times daily. As needed for attacks. 180 tablet 0   diclofenac sodium (VOLTAREN) 1 % GEL Apply 2 g topically 4 (four) times daily. As needed. 100 g 1   indomethacin (INDOCIN) 50  MG capsule TAKE 1 CAPSULE BY MOUTH THREE TIMES DAILY WITH MEALS AS NEEDED FOR  GOUTY  FLAIRS 120 capsule 0   irbesartan (AVAPRO) 150 MG tablet Take 1 tablet by mouth once daily 90 tablet 0   rosuvastatin (CRESTOR) 20 MG tablet TAKE 1 TABLET BY MOUTH EVERY OTHER DAY 90 tablet 0   traMADol (ULTRAM) 50 MG tablet Take 1 tablet (50 mg total) by mouth every 6 (six) hours as needed for up to 7 days. 30 tablet 0   No current facility-administered medications for this visit.     Allergies  Allergen Reactions   Codeine Itching    Review of Systems:  Constitutional: Denies fever, chills, diaphoresis, appetite change and fatigue.  HEENT: Denies photophobia, eye pain, redness, hearing loss, ear pain, congestion, sore throat, rhinorrhea, sneezing, mouth sores, neck pain, neck stiffness and tinnitus.   Respiratory: Denies SOB, DOE, cough, chest tightness,  and wheezing.   Cardiovascular: Denies chest pain, palpitations and leg swelling.  Genitourinary: Denies dysuria, urgency, frequency, hematuria, flank pain and difficulty urinating.  Musculoskeletal: Denies myalgias, back pain, joint swelling, arthralgias and gait problem.  Skin: No rash.  Neurological: Denies dizziness, seizures, syncope, weakness, light-headedness, numbness and headaches.  Hematological: Denies adenopathy. Easy bruising, personal or family bleeding history  Psychiatric/Behavioral: No anxiety or depression     Physical Exam:    Ht 5\' 10"  (1.778 m)    Wt 210 lb (95.3 kg)    BMI 30.13 kg/m  Filed Weights   01/02/19 1335  Weight: 210 lb (95.3 kg)     Data Reviewed: I have personally reviewed following labs and imaging studies  CBC: CBC Latest Ref Rng & Units 12/30/2018 06/11/2017  WBC 4.0 - 10.5 K/uL 10.0 6.6  Hemoglobin 13.0 - 17.0 g/dL 14.5 14.3  Hematocrit 39.0 - 52.0 % 42.9 42.6  Platelets 150.0 - 400.0 K/uL 142.0(L) 167.0    CMP: CMP Latest Ref Rng & Units 12/30/2018 09/18/2017 06/11/2017  Glucose 70 - 99 mg/dL  103(H) 152(H) 116(H)  BUN 6 - 23 mg/dL 12 20 19   Creatinine 0.40 - 1.50 mg/dL 0.87 1.11 0.98  Sodium 135 - 145 mEq/L 140 140 142  Potassium 3.5 - 5.1 mEq/L 4.1 3.8 4.1  Chloride 96 - 112 mEq/L 102 102 101  CO2 19 - 32 mEq/L 28 31 33(H)  Calcium 8.4 - 10.5 mg/dL 9.6 9.6 9.3  Total Protein 6.0 - 8.3 g/dL 6.7 6.7 6.7  Total Bilirubin 0.2 - 1.2 mg/dL 0.6 0.4 0.5  Alkaline Phos 39 - 117 U/L 56 49 44  AST 0 - 37 U/L 16 18 19   ALT 0 - 53 U/L 18 21  21    GFR: Estimated Creatinine Clearance: 102 mL/min (by C-G formula based on SCr of 0.87 mg/dL). Liver Function Tests: Recent Labs  Lab 12/30/18 1034  AST 16  ALT 18  ALKPHOS 56  BILITOT 0.6  PROT 6.7  ALBUMIN 4.4   Recent Labs  Lab 12/30/18 1034  LIPASE 3,258.0*  AMYLASE 621*     Radiology Studies: US Abdomen Complete  Result Date: 01/01/2019 CLINICAL DATA:  Acute epigastric pain EXAM: ABDOMEN ULTRASOUND COMPLETE COMPARISON:  None. FINDINGS: Gallbladder: No gallstones or wall thickening visualized. No sonographic Murphy sign noted by sonographer. Common bile duct: Diameter: 3.8 mm Liver: Coarse heterogeneous increased echogenicity compatible with hepatic steatosis. No biliary dilatation. Triangular ill-defined hypoechoic area or lesion in the posterior right hepatic lobe close to the hepatic venous confluence measures 5.5 x 3.0 x 3.7 cm. This could represent an area fatty sparing versus underlying lesion. Consider follow-up evaluation with nonemergent MRI. Portal vein is patent on color Doppler imaging with normal direction of blood flow towards the liver. IVC: No abnormality visualized. Pancreas: Visualized portion unremarkable. Pancreatic duct is visualized measuring 3 mm. Spleen: Size and appearance within normal limits. Right Kidney: Length: 11.7 cm. Normal echogenicity and cortical thickness. Small cortical anechoic cyst measures 7 mm inferiorly. No hydronephrosis. Left Kidney: Length: 11.9 cm. Normal echogenicity and cortical  thickness. Upper pole anechoic cortical cyst measures 1.8 cm. No hydronephrosis. Abdominal aorta: No aneurysm visualized. Other findings: No free fluid or ascites. IMPRESSION: Hepatic steatosis with indeterminate hypoechoic area or lesion in the posterior right liver measuring up to 5.5 cm. Considerations include area of fatty sparing versus underlying lesion. Consider follow-up evaluation with nonemergent MRI. Negative for gallstones or biliary dilatation Incidental renal cysts Aortic atherosclerosis without aneurysm Electronically Signed   By: Jerilynn Mages.  Shick M.D.   On: 01/01/2019 09:13   This service was provided via telemedicine.  The patient was located at home.  The provider was located in office.  The patient did consent to this telephone visit and is aware of possible charges through their insurance for this visit.  The patient was referred by Dr. Ethelene Hal.   Time spent on call/coordination of care: 45 min    Carmell Austria, MD 01/02/2019, 2:28 PM  Cc: Libby Maw,*

## 2019-01-03 ENCOUNTER — Telehealth: Payer: Self-pay

## 2019-01-03 ENCOUNTER — Other Ambulatory Visit: Payer: Self-pay

## 2019-01-03 ENCOUNTER — Other Ambulatory Visit (HOSPITAL_COMMUNITY)
Admission: RE | Admit: 2019-01-03 | Discharge: 2019-01-03 | Disposition: A | Discharge status: Home / Self Care | Payer: BC Managed Care – PPO | Source: Ambulatory Visit | Attending: Gastroenterology | Admitting: Gastroenterology

## 2019-01-03 DIAGNOSIS — R932 Abnormal findings on diagnostic imaging of liver and biliary tract: Secondary | ICD-10-CM | POA: Diagnosis not present

## 2019-01-03 DIAGNOSIS — K859 Acute pancreatitis without necrosis or infection, unspecified: Secondary | ICD-10-CM

## 2019-01-03 LAB — LIPID PANEL
Cholesterol: 138 mg/dL (ref 0–200)
HDL: 37 mg/dL — ABNORMAL LOW (ref >40)
LDL Cholesterol: 84 mg/dL (ref 0–99)
Total CHOL/HDL Ratio: 3.7 RATIO
Triglycerides: 84 mg/dL (ref <150)
VLDL: 17 mg/dL (ref 0–40)

## 2019-01-03 LAB — COMPREHENSIVE METABOLIC PANEL
ALT: 17 U/L (ref 0–44)
AST: 15 U/L (ref 15–41)
Albumin: 3.8 g/dL (ref 3.5–5.0)
Alkaline Phosphatase: 62 U/L (ref 38–126)
Anion gap: 10 (ref 5–15)
BUN: 13 mg/dL (ref 8–23)
CO2: 28 mmol/L (ref 22–32)
Calcium: 9 mg/dL (ref 8.9–10.3)
Chloride: 100 mmol/L (ref 98–111)
Creatinine, Ser: 0.88 mg/dL (ref 0.61–1.24)
GFR calc Af Amer: >60 mL/min (ref >60)
GFR calc non Af Amer: >60 mL/min (ref >60)
Glucose, Bld: 136 mg/dL — ABNORMAL HIGH (ref 70–99)
Potassium: 4.4 mmol/L (ref 3.5–5.1)
Sodium: 138 mmol/L (ref 135–145)
Total Bilirubin: 0.8 mg/dL (ref 0.3–1.2)
Total Protein: 7.3 g/dL (ref 6.5–8.1)

## 2019-01-03 LAB — CBC WITH DIFFERENTIAL/PLATELET
Abs Immature Granulocytes: 0.03 10*3/uL (ref 0.00–0.07)
Basophils Absolute: 0.1 10*3/uL (ref 0.0–0.1)
Basophils Relative: 1 %
Eosinophils Absolute: 0.3 10*3/uL (ref 0.0–0.5)
Eosinophils Relative: 4 %
HCT: 41 % (ref 39.0–52.0)
Hemoglobin: 13.7 g/dL (ref 13.0–17.0)
Immature Granulocytes: 0 %
Lymphocytes Relative: 32 %
Lymphs Abs: 2.1 10*3/uL (ref 0.7–4.0)
MCH: 31 pg (ref 26.0–34.0)
MCHC: 33.4 g/dL (ref 30.0–36.0)
MCV: 92.8 fL (ref 80.0–100.0)
Monocytes Absolute: 0.6 10*3/uL (ref 0.1–1.0)
Monocytes Relative: 10 %
Neutro Abs: 3.6 10*3/uL (ref 1.7–7.7)
Neutrophils Relative %: 53 %
Platelets: 157 10*3/uL (ref 150–400)
RBC: 4.42 MIL/uL (ref 4.22–5.81)
RDW: 12.1 % (ref 11.5–15.5)
WBC: 6.7 10*3/uL (ref 4.0–10.5)
nRBC: 0 % (ref 0.0–0.2)

## 2019-01-03 LAB — AMYLASE: Amylase: 62 U/L (ref 28–100)

## 2019-01-03 NOTE — Telephone Encounter (Signed)
Called [facility] lab x2 and can not get anyone to pick-up. Trying to see if they would be able to add Lipase onto patient's labs drawn at 8:36 this morning

## 2019-01-03 NOTE — Telephone Encounter (Signed)
-----   Message from Jackquline Denmark, MD sent at 01/03/2019  4:33 PM EDT ----- Please inform the patient.  I am sure he is feeling better. Amylase is back to normal.  Triglycerides are normal.  LFTs are normal. Not sure why lipase was canceled.  Can we please add it on.  He had blood work done already Remaining labs are pending Send report to family physician

## 2019-01-04 LAB — AFP TUMOR MARKER: AFP, Serum, Tumor Marker: 4.2 ng/mL (ref 0.0–8.3)

## 2019-01-04 LAB — CEA: CEA: 1.2 ng/mL (ref 0.0–4.7)

## 2019-01-04 LAB — CANCER ANTIGEN 19-9: CA 19-9: 29 U/mL (ref 0–35)

## 2019-01-07 ENCOUNTER — Other Ambulatory Visit (HOSPITAL_COMMUNITY): Payer: Self-pay | Admitting: Gastroenterology

## 2019-01-07 DIAGNOSIS — K859 Acute pancreatitis without necrosis or infection, unspecified: Secondary | ICD-10-CM

## 2019-01-08 ENCOUNTER — Telehealth: Payer: Self-pay | Admitting: Gastroenterology

## 2019-01-08 ENCOUNTER — Other Ambulatory Visit: Payer: Self-pay

## 2019-01-08 ENCOUNTER — Ambulatory Visit (HOSPITAL_COMMUNITY)
Admission: RE | Admit: 2019-01-08 | Discharge: 2019-01-08 | Disposition: A | Discharge status: Home / Self Care | Payer: BC Managed Care – PPO | Source: Ambulatory Visit | Attending: Gastroenterology | Admitting: Gastroenterology

## 2019-01-08 ENCOUNTER — Telehealth: Payer: Self-pay

## 2019-01-08 DIAGNOSIS — K859 Acute pancreatitis without necrosis or infection, unspecified: Secondary | ICD-10-CM

## 2019-01-08 MED ORDER — GADOBUTROL 1 MMOL/ML IV SOLN: 10.0000 mL | Freq: Once | INTRAVENOUS | Status: DC | PRN

## 2019-01-08 NOTE — Telephone Encounter (Signed)
Left message on machine to call back  

## 2019-01-08 NOTE — Telephone Encounter (Signed)
Pt stated that he was scheduled for an MRI this morning but "did not make it to the machine."  Pt would like a call back to discuss options.

## 2019-01-08 NOTE — Telephone Encounter (Signed)
Copied from [place] [id number]. Topic: General - Other >> Jan 08, 2019  8:48 AM Keene Breath wrote: Reason for CRM: Patient called to inform the doctor that when he went for his MRI, he could not continue because he was very claustrophobic and the tech said that he would have to stop.  Patient would like to know if there is another way to have the tests that he needs.  Please advise and call the patient to let him know.  CB# [phone]

## 2019-01-08 NOTE — Progress Notes (Signed)
Pt severely claustrophobic. Pt placed into bore of scanner and immediatly began crawling out due to claus. Pt will need a wide bore scanner and meds. Pt advised to call MD office to arrange this. Pt confirmed knowledge of next steps.

## 2019-01-08 NOTE — Telephone Encounter (Signed)
We can send him for an open MRI.

## 2019-01-14 NOTE — Telephone Encounter (Signed)
Thx Will discuss more at FU appt.  RG

## 2019-01-14 NOTE — Telephone Encounter (Signed)
The pt was scheduled for MRI and could not continue due to claustrophobia. The pt states that the technician advised that sedation would not be reasonable due to the severity of the claustrophobia.  Please advise.  He has an appt on 7/10 with Dr Lyndel Safe.

## 2019-01-14 NOTE — Telephone Encounter (Signed)
Patient is returning your call. Would like to be reached at [phone]

## 2019-01-24 ENCOUNTER — Telehealth (INDEPENDENT_AMBULATORY_CARE_PROVIDER_SITE_OTHER): Payer: BC Managed Care – PPO | Admitting: Gastroenterology

## 2019-01-24 ENCOUNTER — Encounter: Payer: Self-pay | Admitting: Gastroenterology

## 2019-01-24 ENCOUNTER — Other Ambulatory Visit: Payer: Self-pay

## 2019-01-24 VITALS — Ht 70.0 in | Wt 220.0 lb

## 2019-01-24 DIAGNOSIS — R932 Abnormal findings on diagnostic imaging of liver and biliary tract: Secondary | ICD-10-CM

## 2019-01-24 DIAGNOSIS — K859 Acute pancreatitis without necrosis or infection, unspecified: Secondary | ICD-10-CM

## 2019-01-24 NOTE — Progress Notes (Signed)
Chief Complaint: FU  Referring Provider:  Libby Maw,*      ASSESSMENT AND PLAN;   #1. Acute Pancreatitis (resolved). ?etiology. No alcohol, gallstones, abn LFTs, trauma, FH of pancreatitis, cocaine use, use of medications (except for ACE) associated with pancreatitis, hypercalcemia, intake of any herbal medications or previous history of pancreatitis. Nl CBC, CMP, amylase, TG, CA19-9, CEA and AFP 12/2018  #2. AbN US showing ? 5.5 cm R hepatic lesion 01/01/2019. Nl pancreas. Went for MRI 12/2018 but could not tolerate, as he became very claustrophobic.  Plan: - Check HBA1c.  Had persistently high random blood sugars. - Continue low-fat diet. - Resume work from Monday 7/13 onwards. - Triphasic CT scan abdo for further evaluation (of abn [place]) as he could not tolerate MRI. - FU in 12 weeks. - Discussed in detail with the patient and patient's wife.   HPI:    Eric Salas is a 63 y.o. male  Known to me from screening colonoscopy on 11/01/2017  For FU acute pancreatitis.  Doing great.  No abdominal pain. No nausea, vomiting, heartburn, regurgitation, odynophagia or dysphagia.  No significant diarrhea or constipation.  There is no melena or hematochezia. No unintentional weight loss.  Went for MRI.  As he went into the scanner, he became very claustrophobic and MRI had to be aborted.  The MRI tech did not feel that he could tolerate MRI with oral antianxiety medications.  Labs are all normal including amylase.   No alcohol for last 15 years.    Past GI procedures: Colonoscopy.  11/01/2017 small colonic polyp status post polypectomy, mild left colonic diverticulosis.  Biopsies tubular adenoma.  Has positive family history of colon cancer in brother for age of 64. Rpt in 5 yrs. Past Medical History:  Diagnosis Date   Arthritis    Cataract    lt. eye beginning stage   Hyperlipidemia    Hypertension     Past Surgical History:  Procedure Laterality Date     COLONOSCOPY      Family History  Problem Relation Age of Onset   Cancer Mother    48 / [place] Mother    Alcohol abuse Father    Diabetes Father    Heart attack Father    Heart disease Father    High Cholesterol Father    High blood pressure Father    Stroke Father    Diabetes Brother    Hearing loss Brother    High Cholesterol Brother    Colon cancer Brother    Cancer Brother    Diabetes Brother    Drug abuse Brother    High Cholesterol Brother    Stomach cancer Brother    Rectal cancer Neg Hx    Esophageal cancer Neg Hx    Pancreatic cancer Neg Hx    Liver cancer Neg Hx    Prostate cancer Neg Hx     Social History   Tobacco Use   Smoking status: Former Smoker    Types: Cigarettes    Quit date: 10/18/2005    Years since quitting: 13.2   Smokeless tobacco: Never Used  Substance Use Topics   Alcohol use: Not Currently    Frequency: Never    Comment: quit 15 years ago   Drug use: No    Current Outpatient Medications  Medication Sig Dispense Refill   allopurinol (ZYLOPRIM) 100 MG tablet Take 1 tablet (100 mg total) by mouth daily. 90 tablet 1   cimetidine (TAGAMET HB)  200 MG tablet Take 1 tablet (200 mg total) by mouth 2 (two) times daily for 30 days. 60 tablet 0   colchicine 0.6 MG tablet Take 1 tablet (0.6 mg total) by mouth 2 (two) times daily. As needed for attacks. 180 tablet 0   diclofenac sodium (VOLTAREN) 1 % GEL Apply 2 g topically 4 (four) times daily. As needed. 100 g 1   indomethacin (INDOCIN) 50 MG capsule TAKE 1 CAPSULE BY MOUTH THREE TIMES DAILY WITH MEALS AS NEEDED FOR  GOUTY  FLAIRS 120 capsule 0   irbesartan (AVAPRO) 150 MG tablet Take 1 tablet by mouth once daily 90 tablet 0   rosuvastatin (CRESTOR) 20 MG tablet TAKE 1 TABLET BY MOUTH EVERY OTHER DAY 90 tablet 0   traMADol (ULTRAM) 50 MG tablet Take 1 tablet (50 mg total) by mouth every 8 (eight) hours as needed. 30 tablet 0   No current  facility-administered medications for this visit.     Allergies  Allergen Reactions   Codeine Itching    Review of Systems:  neg     Physical Exam:    Ht 5\' 10"  (1.778 m)    Wt 220 lb (99.8 kg)    BMI 31.57 kg/m  Filed Weights   01/24/19 1445  Weight: 220 lb (99.8 kg)     Data Reviewed: I have personally reviewed following labs and imaging studies  CBC: CBC Latest Ref Rng & Units 01/03/2019 12/30/2018 06/11/2017  WBC 4.0 - 10.5 K/uL 6.7 10.0 6.6  Hemoglobin 13.0 - 17.0 g/dL 13.7 14.5 14.3  Hematocrit 39.0 - 52.0 % 41.0 42.9 42.6  Platelets 150 - 400 K/uL 157 142.0(L) 167.0    CMP: CMP Latest Ref Rng & Units 01/03/2019 12/30/2018 09/18/2017  Glucose 70 - 99 mg/dL 136(H) 103(H) 152(H)  BUN 8 - 23 mg/dL 13 12 20   Creatinine 0.61 - 1.24 mg/dL 0.88 0.87 1.11  Sodium 135 - 145 mmol/L 138 140 140  Potassium 3.5 - 5.1 mmol/L 4.4 4.1 3.8  Chloride 98 - 111 mmol/L 100 102 102  CO2 22 - 32 mmol/L 28 28 31   Calcium 8.9 - 10.3 mg/dL 9.0 9.6 9.6  Total Protein 6.5 - 8.1 g/dL 7.3 6.7 6.7  Total Bilirubin 0.3 - 1.2 mg/dL 0.8 0.6 0.4  Alkaline Phos 38 - 126 U/L 62 56 49  AST 15 - 41 U/L 15 16 18   ALT 0 - 44 U/L 17 18 21    Amylase    Component Value Date/Time   AMYLASE 62 01/03/2019 0800     Radiology Studies: US Abdomen Complete  Result Date: 01/01/2019 CLINICAL DATA:  Acute epigastric pain EXAM: ABDOMEN ULTRASOUND COMPLETE COMPARISON:  None. FINDINGS: Gallbladder: No gallstones or wall thickening visualized. No sonographic Murphy sign noted by sonographer. Common bile duct: Diameter: 3.8 mm Liver: Coarse heterogeneous increased echogenicity compatible with hepatic steatosis. No biliary dilatation. Triangular ill-defined hypoechoic area or lesion in the posterior right hepatic lobe close to the hepatic venous confluence measures 5.5 x 3.0 x 3.7 cm. This could represent an area fatty sparing versus underlying lesion. Consider follow-up evaluation with nonemergent MRI. Portal vein  is patent on color Doppler imaging with normal direction of blood flow towards the liver. IVC: No abnormality visualized. Pancreas: Visualized portion unremarkable. Pancreatic duct is visualized measuring 3 mm. Spleen: Size and appearance within normal limits. Right Kidney: Length: 11.7 cm. Normal echogenicity and cortical thickness. Small cortical anechoic cyst measures 7 mm inferiorly. No hydronephrosis. Left Kidney: Length: 11.9 cm. Normal  echogenicity and cortical thickness. Upper pole anechoic cortical cyst measures 1.8 cm. No hydronephrosis. Abdominal aorta: No aneurysm visualized. Other findings: No free fluid or ascites. IMPRESSION: Hepatic steatosis with indeterminate hypoechoic area or lesion in the posterior right liver measuring up to 5.5 cm. Considerations include area of fatty sparing versus underlying lesion. Consider follow-up evaluation with nonemergent MRI. Negative for gallstones or biliary dilatation Incidental renal cysts Aortic atherosclerosis without aneurysm Electronically Signed   By: Jerilynn Mages.  Shick M.D.   On: 01/01/2019 09:13   This service was provided via telemedicine.  The patient was located at home.  The provider was located in office.  The patient did consent to this telephone visit and is aware of possible charges through their insurance for this visit.  The patient was referred by Dr. Ethelene Hal.   Time spent on call/coordination of care: 15 min    Carmell Austria, MD 01/24/2019, 4:00 PM  Cc: Libby Maw

## 2019-01-24 NOTE — Patient Instructions (Signed)
If you are age 62 or older, your body mass index should be between 23-30. Your Body mass index is 31.57 kg/m. If this is out of the aforementioned range listed, please consider follow up with your Primary Care Provider.  If you are age 32 or younger, your body mass index should be between 19-25. Your Body mass index is 31.57 kg/m. If this is out of the aformentioned range listed, please consider follow up with your Primary Care Provider.   To help prevent the possible spread of infection to our patients, communities, and staff; we will be implementing the following measures:  As of now we are not allowing any visitors/family members to accompany you to any upcoming appointments with [facility]. If you have any concerns about this please contact our office to discuss prior to the appointment.   Your provider has requested that you go to the basement level for lab work at our [place] location ([address] [place] [address]) . Press "B" on the elevator. The lab is located at the first door on the left as you exit the elevator. You may go at whatever time is convienent for you. The current hours of operations are Monday- Friday 7:30am-4:30pm.  We will contact you when your CT has been scheduled.  You may resume your normal work schedule on 01/27/2019.  Continue low-fat diet.  Please call our office at [phone] to set up your 12 week follow up visit.  Thank you,  Dr. Jackquline Denmark

## 2019-01-24 NOTE — Addendum Note (Signed)
Addended by: Herma Mering D on: 01/24/2019 04:53 PM   Modules accepted: Orders

## 2019-01-27 ENCOUNTER — Other Ambulatory Visit: Payer: Self-pay

## 2019-01-27 ENCOUNTER — Telehealth: Payer: Self-pay | Admitting: Gastroenterology

## 2019-01-27 DIAGNOSIS — R932 Abnormal findings on diagnostic imaging of liver and biliary tract: Secondary | ICD-10-CM

## 2019-01-27 DIAGNOSIS — K769 Liver disease, unspecified: Secondary | ICD-10-CM

## 2019-01-27 DIAGNOSIS — K859 Acute pancreatitis without necrosis or infection, unspecified: Secondary | ICD-10-CM

## 2019-01-27 NOTE — Telephone Encounter (Signed)
Can you assist in this matter? Patient is requesting a work note

## 2019-01-28 NOTE — Telephone Encounter (Signed)
I have faxed patients note to number provided.

## 2019-01-31 ENCOUNTER — Ambulatory Visit (HOSPITAL_BASED_OUTPATIENT_CLINIC_OR_DEPARTMENT_OTHER)
Admission: RE | Admit: 2019-01-31 | Discharge: 2019-01-31 | Disposition: A | Discharge status: Home / Self Care | Payer: BC Managed Care – PPO | Source: Ambulatory Visit | Attending: Gastroenterology | Admitting: Gastroenterology

## 2019-01-31 ENCOUNTER — Other Ambulatory Visit: Payer: Self-pay

## 2019-01-31 DIAGNOSIS — R932 Abnormal findings on diagnostic imaging of liver and biliary tract: Secondary | ICD-10-CM | POA: Diagnosis not present

## 2019-01-31 DIAGNOSIS — N281 Cyst of kidney, acquired: Secondary | ICD-10-CM | POA: Diagnosis not present

## 2019-01-31 DIAGNOSIS — K769 Liver disease, unspecified: Secondary | ICD-10-CM | POA: Insufficient documentation

## 2019-01-31 DIAGNOSIS — K859 Acute pancreatitis without necrosis or infection, unspecified: Secondary | ICD-10-CM | POA: Diagnosis not present

## 2019-01-31 MED ORDER — IOHEXOL 300 MG/ML  SOLN
100.0000 mL | Freq: Once | INTRAMUSCULAR | Status: AC | PRN
  Administered 2019-01-31: 100 mL via INTRAVENOUS

## 2019-04-02 ENCOUNTER — Other Ambulatory Visit: Payer: Self-pay | Admitting: Family Medicine

## 2019-04-02 DIAGNOSIS — M109 Gout, unspecified: Secondary | ICD-10-CM

## 2019-04-15 ENCOUNTER — Other Ambulatory Visit: Payer: Self-pay | Admitting: Family Medicine

## 2019-04-15 DIAGNOSIS — I10 Essential (primary) hypertension: Secondary | ICD-10-CM

## 2019-07-01 ENCOUNTER — Other Ambulatory Visit: Payer: Self-pay | Admitting: Family Medicine

## 2019-07-01 DIAGNOSIS — E7800 Pure hypercholesterolemia, unspecified: Secondary | ICD-10-CM

## 2019-07-02 NOTE — Telephone Encounter (Signed)
Called pt and he agreed to an office visit. This was scheduled for this Friday. Pt states he will be fasting in case he needs blood work.

## 2019-07-03 ENCOUNTER — Other Ambulatory Visit: Payer: Self-pay

## 2019-07-04 ENCOUNTER — Encounter: Payer: Self-pay | Admitting: Family Medicine

## 2019-07-04 ENCOUNTER — Ambulatory Visit: Payer: BC Managed Care – PPO | Admitting: Family Medicine

## 2019-07-04 VITALS — BP 142/100 | HR 67 | Temp 97.5°F | Ht 70.0 in | Wt 223.8 lb

## 2019-07-04 DIAGNOSIS — M1A9XX0 Chronic gout, unspecified, without tophus (tophi): Secondary | ICD-10-CM

## 2019-07-04 DIAGNOSIS — Z0000 Encounter for general adult medical examination without abnormal findings: Secondary | ICD-10-CM

## 2019-07-04 DIAGNOSIS — I10 Essential (primary) hypertension: Secondary | ICD-10-CM | POA: Diagnosis not present

## 2019-07-04 DIAGNOSIS — Z8719 Personal history of other diseases of the digestive system: Secondary | ICD-10-CM

## 2019-07-04 DIAGNOSIS — E7800 Pure hypercholesterolemia, unspecified: Secondary | ICD-10-CM

## 2019-07-04 DIAGNOSIS — R7309 Other abnormal glucose: Secondary | ICD-10-CM

## 2019-07-04 DIAGNOSIS — K760 Fatty (change of) liver, not elsewhere classified: Secondary | ICD-10-CM | POA: Diagnosis not present

## 2019-07-04 DIAGNOSIS — Z114 Encounter for screening for human immunodeficiency virus [HIV]: Secondary | ICD-10-CM

## 2019-07-04 LAB — CBC
HCT: 40.7 % (ref 39.0–52.0)
Hemoglobin: 13.7 g/dL (ref 13.0–17.0)
MCHC: 33.6 g/dL (ref 30.0–36.0)
MCV: 91.2 fl (ref 78.0–100.0)
Platelets: 192 10*3/uL (ref 150.0–400.0)
RBC: 4.46 Mil/uL (ref 4.22–5.81)
RDW: 13.1 % (ref 11.5–15.5)
WBC: 5.4 10*3/uL (ref 4.0–10.5)

## 2019-07-04 LAB — COMPREHENSIVE METABOLIC PANEL
ALT: 21 U/L (ref 0–53)
AST: 15 U/L (ref 0–37)
Albumin: 4 g/dL (ref 3.5–5.2)
Alkaline Phosphatase: 54 U/L (ref 39–117)
BUN: 16 mg/dL (ref 6–23)
CO2: 29 mEq/L (ref 19–32)
Calcium: 8.8 mg/dL (ref 8.4–10.5)
Chloride: 106 mEq/L (ref 96–112)
Creatinine, Ser: 0.94 mg/dL (ref 0.40–1.50)
GFR: 81.16 mL/min (ref >60.00)
Glucose, Bld: 99 mg/dL (ref 70–99)
Potassium: 4.5 mEq/L (ref 3.5–5.1)
Sodium: 141 mEq/L (ref 135–145)
Total Bilirubin: 0.4 mg/dL (ref 0.2–1.2)
Total Protein: 6.3 g/dL (ref 6.0–8.3)

## 2019-07-04 LAB — LIPASE: Lipase: 14 U/L (ref 11.0–59.0)

## 2019-07-04 LAB — HEMOGLOBIN A1C: Hgb A1c MFr Bld: 6.2 % (ref 4.6–6.5)

## 2019-07-04 LAB — LIPID PANEL
Cholesterol: 140 mg/dL (ref 0–200)
HDL: 40.1 mg/dL (ref >39.00)
LDL Cholesterol: 85 mg/dL (ref 0–99)
NonHDL: 99.83
Total CHOL/HDL Ratio: 3
Triglycerides: 72 mg/dL (ref 0.0–149.0)
VLDL: 14.4 mg/dL (ref 0.0–40.0)

## 2019-07-04 LAB — LDL CHOLESTEROL, DIRECT: Direct LDL: 80 mg/dL

## 2019-07-04 LAB — URIC ACID: Uric Acid, Serum: 7.4 mg/dL (ref 4.0–7.8)

## 2019-07-04 LAB — AMYLASE: Amylase: 31 U/L (ref 27–131)

## 2019-07-04 MED ORDER — IRBESARTAN 300 MG PO TABS: 300.0000 mg | ORAL_TABLET | Freq: Every day | ORAL | 0 refills | Status: DC

## 2019-07-04 NOTE — Progress Notes (Signed)
Established Patient Office Visit  Subjective:  Patient ID: Eric Salas, male    DOB: 1955-07-28  Age: 63 y.o. MRN: [id number]  CC:  Chief Complaint  Patient presents with  . Follow-up    HPI Eric Salas presents for for follow-up of his chronic problems to include hypertension, elevated cholesterol and gout.  He has been lost to follow-up for these issues since March 2019.  He was last seen in June of this year for an acute visit for pancreatitis.  He was able to see the dentist this year.  Has not seen an eye doctor in some time now.  Has been unable to control his gout taking allopurinol.  He says that allopurinol precipitates attacks even when he takes it with colchicine.  He has gained some weight over the last several months.  Past Medical History:  Diagnosis Date  . Arthritis   . Cataract    lt. eye beginning stage  . Hyperlipidemia   . Hypertension     Past Surgical History:  Procedure Laterality Date  . COLONOSCOPY      Family History  Problem Relation Age of Onset  . Cancer Mother   . Miscarriages / [place] Mother   . Alcohol abuse Father   . Diabetes Father   . Heart attack Father   . Heart disease Father   . High Cholesterol Father   . High blood pressure Father   . Stroke Father   . Diabetes Brother   . Hearing loss Brother   . High Cholesterol Brother   . Colon cancer Brother   . Cancer Brother   . Diabetes Brother   . Drug abuse Brother   . High Cholesterol Brother   . Stomach cancer Brother   . Rectal cancer Neg Hx   . Esophageal cancer Neg Hx   . Pancreatic cancer Neg Hx   . Liver cancer Neg Hx   . Prostate cancer Neg Hx     Social History   Socioeconomic History  . Marital status: Married    Spouse name: Not on file  . Number of children: Not on file  . Years of education: Not on file  . Highest education level: Not on file  Occupational History  . Not on file  Tobacco Use  . Smoking status: Former Smoker   Types: Cigarettes    Quit date: 10/18/2005    Years since quitting: 13.7  . Smokeless tobacco: Never Used  Substance and Sexual Activity  . Alcohol use: Not Currently    Comment: quit 15 years ago  . Drug use: No  . Sexual activity: Yes    Partners: Female  Other Topics Concern  . Not on file  Social History Narrative  . Not on file   Social Determinants of Health   Financial Resource Strain:   . Difficulty of Paying Living Expenses: Not on file  Food Insecurity:   . Worried About Charity fundraiser in the Last Year: Not on file  . Ran Out of Food in the Last Year: Not on file  Transportation Needs:   . Lack of Transportation (Medical): Not on file  . Lack of Transportation (Non-Medical): Not on file  Physical Activity:   . Days of Exercise per Week: Not on file  . Minutes of Exercise per Session: Not on file  Stress:   . Feeling of Stress : Not on file  Social Connections:   . Frequency of Communication with Friends and  Family: Not on file  . Frequency of Social Gatherings with Friends and Family: Not on file  . Attends Religious Services: Not on file  . Active Member of Clubs or Organizations: Not on file  . Attends Archivist Meetings: Not on file  . Marital Status: Not on file  Intimate Partner Violence:   . Fear of Current or Ex-Partner: Not on file  . Emotionally Abused: Not on file  . Physically Abused: Not on file  . Sexually Abused: Not on file    Outpatient Medications Prior to Visit  Medication Sig Dispense Refill  . indomethacin (INDOCIN) 50 MG capsule TAKE 1 CAPSULE BY MOUTH THREE TIMES DAILY WITH MEALS AS NEEDED FOR GOUTY FLAIRS 120 capsule 0  . rosuvastatin (CRESTOR) 20 MG tablet TAKE 1 TABLET BY MOUTH EVERY OTHER DAY 90 tablet 0  . irbesartan (AVAPRO) 150 MG tablet TAKE 1 TABLET BY MOUTH ONCE DAILY . APPOINTMENT REQUIRED FOR FUTURE REFILLS 90 tablet 0  . allopurinol (ZYLOPRIM) 100 MG tablet Take 1 tablet (100 mg total) by mouth daily. (Patient  not taking: Reported on 07/04/2019) 90 tablet 1  . cimetidine (TAGAMET HB) 200 MG tablet Take 1 tablet (200 mg total) by mouth 2 (two) times daily for 30 days. 60 tablet 0  . colchicine 0.6 MG tablet Take 1 tablet (0.6 mg total) by mouth 2 (two) times daily. As needed for attacks. (Patient not taking: Reported on 07/04/2019) 180 tablet 0  . diclofenac sodium (VOLTAREN) 1 % GEL Apply 2 g topically 4 (four) times daily. As needed. (Patient not taking: Reported on 07/04/2019) 100 g 1  . traMADol (ULTRAM) 50 MG tablet Take 1 tablet (50 mg total) by mouth every 8 (eight) hours as needed. (Patient not taking: Reported on 07/04/2019) 30 tablet 0   No facility-administered medications prior to visit.    Allergies  Allergen Reactions  . Codeine Itching    ROS Review of Systems  Constitutional: Negative.   HENT: Negative.   Eyes: Negative for photophobia and visual disturbance.  Respiratory: Negative.   Cardiovascular: Negative.   Gastrointestinal: Negative.   Endocrine: Negative for polyphagia and polyuria.  Musculoskeletal: Positive for arthralgias. Negative for gait problem.  Allergic/Immunologic: Negative for immunocompromised state.  Neurological: Negative for tremors and speech difficulty.  Hematological: Does not bruise/bleed easily.  Psychiatric/Behavioral: Negative.       Objective:    Physical Exam  Constitutional: He appears well-developed and well-nourished. No distress.  HENT:  Head: Normocephalic and atraumatic.  Right Ear: External ear normal.  Left Ear: External ear normal.  Eyes: Conjunctivae are normal. Right eye exhibits no discharge. Left eye exhibits no discharge. No scleral icterus.  Neck: No JVD present. No tracheal deviation present.  Cardiovascular: Normal rate, regular rhythm and normal heart sounds.  Pulses:      Dorsalis pedis pulses are 2+ on the right side and 2+ on the left side.       Posterior tibial pulses are 1+ on the right side and 1+ on the left  side.  Pulmonary/Chest: Effort normal and breath sounds normal. No stridor.  Skin: He is not diaphoretic.    BP (!) 142/100   Pulse 67   Temp (!) 97.5 F (36.4 C)   Ht _0  (1.778 m)   Wt 223 lb 12.8 oz (101.5 kg)   SpO2 98%   BMI 32.11 kg/m  Wt Readings from Last 3 Encounters:  07/04/19 223 lb 12.8 oz (101.5 kg)  01/24/19 220 lb (  99.8 kg)  01/02/19 210 lb (95.3 kg)     Health Maintenance Due  Topic Date Due  . HIV Screening  11/30/1971  . TETANUS/TDAP  11/30/1975    There are no preventive care reminders to display for this patient.  Lab Results  Component Value Date   TSH 1.08 06/11/2017   Lab Results  Component Value Date   WBC 6.7 01/03/2019   HGB 13.7 01/03/2019   HCT 41.0 01/03/2019   MCV 92.8 01/03/2019   PLT 157 01/03/2019   Lab Results  Component Value Date   NA 138 01/03/2019   K 4.4 01/03/2019   CO2 28 01/03/2019   GLUCOSE 136 (H) 01/03/2019   BUN 13 01/03/2019   CREATININE 0.88 01/03/2019   BILITOT 0.8 01/03/2019   ALKPHOS 62 01/03/2019   AST 15 01/03/2019   ALT 17 01/03/2019   PROT 7.3 01/03/2019   ALBUMIN 3.8 01/03/2019   CALCIUM 9.0 01/03/2019   ANIONGAP 10 01/03/2019   GFR 88.89 12/30/2018   Lab Results  Component Value Date   CHOL 138 01/03/2019   Lab Results  Component Value Date   HDL 37 (L) 01/03/2019   Lab Results  Component Value Date   LDLCALC 84 01/03/2019   Lab Results  Component Value Date   TRIG 84 01/03/2019   Lab Results  Component Value Date   CHOLHDL 3.7 01/03/2019   No results found for: HGBA1C    Assessment & Plan:   Problem List Items Addressed This Visit      Cardiovascular and Mediastinum   HTN (hypertension), benign   Relevant Medications   irbesartan (AVAPRO) 300 MG tablet     Other   Elevated LDL cholesterol level   Relevant Orders   Comp Met (CMET)   Direct LDL   Lipid Profile   Gout   Relevant Orders   Uric acid   Ambulatory referral to Rheumatology   Elevated glucose    Relevant Orders   HgB A1c    Other Visit Diagnoses    Essential hypertension    -  Primary   Relevant Medications   irbesartan (AVAPRO) 300 MG tablet   Other Relevant Orders   Comp Met (CMET)   CBC   Urinalysis, Routine w reflex microscopic   Screening for HIV (human immunodeficiency virus)       Relevant Orders   HIV antibody (with reflex)   Rheumatoid Factor   NAFLD (nonalcoholic fatty liver disease)       Relevant Orders   Comp Met (CMET)   History of pancreatitis       Relevant Orders   Amylase   Lipase   Healthcare maintenance       Relevant Orders   Ambulatory referral to Ophthalmology      Meds ordered this encounter  Medications  . irbesartan (AVAPRO) 300 MG tablet    Sig: Take 1 tablet (300 mg total) by mouth daily.    Dispense:  90 tablet    Refill:  0    Follow-up: Return in about 3 months (around 10/02/2019), or physical in 3 months..   Patient to return in 3 months for complete physical.  Discussed his fatty liver as seen on his last abdominal ultrasound and needs a possible impact on his future liver health.  Weight loss is important.  He was given information on the Mediterranean diet that could also help control his cholesterol.  Increase in weight has led to increase in blood pressure.  Increased  the Avapro to 300 mg daily.  Following up on his pancreatitis with an amylase and lipase.  Have been unable to control patient's gout.  Patient says that he has been unable to take allopurinol in the colchicine together because when he does it flares his gout.  We will seek second opinion from rheumatology. Libby Maw, MD

## 2019-07-04 NOTE — Patient Instructions (Signed)
Gout  Gout is a condition that causes painful swelling of the joints. Gout is a type of inflammation of the joints (arthritis). This condition is caused by having too much uric acid in the body. Uric acid is a chemical that forms when the body breaks down substances called purines. Purines are important for building body proteins. When the body has too much uric acid, sharp crystals can form and build up inside the joints. This causes pain and swelling. Gout attacks can happen quickly and may be very painful (acute gout). Over time, the attacks can affect more joints and become more frequent (chronic gout). Gout can also cause uric acid to build up under the skin and inside the kidneys. What are the causes? This condition is caused by too much uric acid in your blood. This can happen because:  Your kidneys do not remove enough uric acid from your blood. This is the most common cause.  Your body makes too much uric acid. This can happen with some cancers and cancer treatments. It can also occur if your body is breaking down too many red blood cells (hemolytic anemia).  You eat too many foods that are high in purines. These foods include organ meats and some seafood. Alcohol, especially beer, is also high in purines. A gout attack may be triggered by trauma or stress. What increases the risk? You are more likely to develop this condition if you:  Have a family history of gout.  Are male and middle-aged.  Are male and have gone through menopause.  Are obese.  Frequently drink alcohol, especially beer.  Are dehydrated.  Lose weight too quickly.  Have an organ transplant.  Have lead poisoning.  Take certain medicines, including aspirin, cyclosporine, diuretics, levodopa, and niacin.  Have kidney disease.  Have a skin condition called psoriasis. What are the signs or symptoms? An attack of acute gout happens quickly. It usually occurs in just one joint. The most common place is  the big toe. Attacks often start at night. Other joints that may be affected include joints of the feet, ankle, knee, fingers, wrist, or elbow. Symptoms of this condition may include:  Severe pain.  Warmth.  Swelling.  Stiffness.  Tenderness. The affected joint may be very painful to touch.  Shiny, red, or purple skin.  Chills and fever. Chronic gout may cause symptoms more frequently. More joints may be involved. You may also have white or yellow lumps (tophi) on your hands or feet or in other areas near your joints. How is this diagnosed? This condition is diagnosed based on your symptoms, medical history, and physical exam. You may have tests, such as:  Blood tests to measure uric acid levels.  Removal of joint fluid with a thin needle (aspiration) to look for uric acid crystals.  X-rays to look for joint damage. How is this treated? Treatment for this condition has two phases: treating an acute attack and preventing future attacks. Acute gout treatment may include medicines to reduce pain and swelling, including:  NSAIDs.  Steroids. These are strong anti-inflammatory medicines that can be taken by mouth (orally) or injected into a joint.  Colchicine. This medicine relieves pain and swelling when it is taken soon after an attack. It can be given by mouth or through an IV. Preventive treatment may include:  Daily use of smaller doses of NSAIDs or colchicine.  Use of a medicine that reduces uric acid levels in your blood.  Changes to your diet. You may   need to see a dietitian about what to eat and drink to prevent gout. Follow these instructions at home: During a gout attack   If directed, put ice on the affected area: ? Put ice in a plastic bag. ? Place a towel between your skin and the bag. ? Leave the ice on for 20 minutes, 2-3 times a day.  Raise (elevate) the affected joint above the level of your heart as often as possible.  Rest the joint as much as possible.  If the affected joint is in your leg, you may be given crutches to use.  Follow instructions from your health care provider about eating or drinking restrictions. Avoiding future gout attacks  Follow a low-purine diet as told by your dietitian or health care provider. Avoid foods and drinks that are high in purines, including liver, kidney, anchovies, asparagus, herring, mushrooms, mussels, and beer.  Maintain a healthy weight or lose weight if you are overweight. If you want to lose weight, talk with your health care provider. It is important that you do not lose weight too quickly.  Start or maintain an exercise program as told by your health care provider. Eating and drinking  Drink enough fluids to keep your urine pale yellow.  If you drink alcohol: ? Limit how much you use to:  0-1 drink a day for women.  0-2 drinks a day for men. ? Be aware of how much alcohol is in your drink. In the [place] one drink equals one 12 oz bottle of beer (355 mL) one 5 oz glass of wine (148 mL), or one 1 oz glass of hard liquor (44 mL). General instructions  Take over-the-counter and prescription medicines only as told by your health care provider.  Do not drive or use heavy machinery while taking prescription pain medicine.  Return to your normal activities as told by your health care provider. Ask your health care provider what activities are safe for you.  Keep all follow-up visits as told by your health care provider. This is important. Contact a health care provider if you have:  Another gout attack.  Continuing symptoms of a gout attack after 10 days of treatment.  Side effects from your medicines.  Chills or a fever.  Burning pain when you urinate.  Pain in your lower back or belly. Get help right away if you:  Have severe or uncontrolled pain.  Cannot urinate. Summary  Gout is painful swelling of the joints caused by inflammation.  The most common site of pain is the big  toe, but it can affect other joints in the body.  Medicines and dietary changes can help to prevent and treat gout attacks. This information is not intended to replace advice given to you by your health care provider. Make sure you discuss any questions you have with your health care provider. Document Released: 06/30/2000 Document Revised: 01/23/2018 Document Reviewed: 01/23/2018 Elsevier Patient Education  [place] A low-purine eating plan involves making food choices to limit your intake of purine. Purine is a kind of uric acid. Too much uric acid in your blood can cause certain conditions, such as gout and kidney stones. Eating a low-purine diet can help control these conditions. What are tips for following this plan? Reading food labels   Avoid foods with saturated or Trans fat.  Check the ingredient list of grains-based foods, such as bread and cereal, to make sure that they contain whole grains.  Check the  ingredient list of sauces or soups to make sure they do not contain meat or fish.  When choosing soft drinks, check the ingredient list to make sure they do not contain high-fructose corn syrup. Shopping  Buy plenty of fresh fruits and vegetables.  Avoid buying canned or fresh fish.  Buy dairy products labeled as low-fat or nonfat.  Avoid buying premade or processed foods. These foods are often high in fat, salt (sodium), and added sugar. Cooking  Use olive oil instead of butter when cooking. Oils like olive oil, canola oil, and sunflower oil contain healthy fats. Meal planning  Learn which foods do or do not affect you. If you find out that a food tends to cause your gout symptoms to flare up, avoid eating that food. You can enjoy foods that do not cause problems. If you have any questions about a food item, talk with your dietitian or health care provider.  Limit foods high in fat, especially saturated fat. Fat makes it harder for your  body to get rid of uric acid.  Choose foods that are lower in fat and are lean sources of protein. General guidelines  Limit alcohol intake to no more than 1 drink a day for nonpregnant women and 2 drinks a day for men. One drink equals 12 oz of beer, 5 oz of wine, or 1 oz of hard liquor. Alcohol can affect the way your body gets rid of uric acid.  Drink plenty of water to keep your urine clear or pale yellow. Fluids can help remove uric acid from your body.  If directed by your health care provider, take a vitamin C supplement.  Work with your health care provider and dietitian to develop a plan to achieve or maintain a healthy weight. Losing weight can help reduce uric acid in your blood. What foods are recommended? The items listed may not be a complete list. Talk with your dietitian about what dietary choices are best for you. Foods low in purines Foods low in purines do not need to be limited. These include:  All fruits.  All low-purine vegetables, pickles, and olives.  Breads, pasta, rice, cornbread, and popcorn. Cake and other baked goods.  All dairy foods.  Eggs, nuts, and nut butters.  Spices and condiments, such as salt, herbs, and vinegar.  Plant oils, butter, and margarine.  Water, sugar-free soft drinks, tea, coffee, and cocoa.  Vegetable-based soups, broths, sauces, and gravies. Foods moderate in purines Foods moderate in purines should be limited to the amounts listed.   cup of asparagus, cauliflower, spinach, mushrooms, or green peas, each day.  2/3 cup uncooked oatmeal, each day.   cup dry wheat bran or wheat germ, each day.  2-3 ounces of meat or poultry, each day.  4-6 ounces of shellfish, such as crab, lobster, oysters, or shrimp, each day.  1 cup cooked beans, peas, or lentils, each day.  Soup, broths, or bouillon made from meat or fish. Limit these foods as much as possible. What foods are not recommended? The items listed may not be a  complete list. Talk with your dietitian about what dietary choices are best for you. Limit your intake of foods high in purines, including:  Beer and other alcohol.  Meat-based gravy or sauce.  Canned or fresh fish, such as: ? Anchovies, sardines, herring, and tuna. ? Mussels and scallops. ? Codfish, trout, and haddock.  Berniece Salines.  Organ meats, such as: ? Liver or kidney. ? Tripe. ? Sweetbreads (thymus gland  or pancreas).  Wild Clinical biochemist.  Yeast or yeast extract supplements.  Drinks sweetened with high-fructose corn syrup. Summary  Eating a low-purine diet can help control conditions caused by too much uric acid in the body, such as gout or kidney stones.  Choose low-purine foods, limit alcohol, and limit foods high in fat.  You will learn over time which foods do or do not affect you. If you find out that a food tends to cause your gout symptoms to flare up, avoid eating that food. This information is not intended to replace advice given to you by your health care provider. Make sure you discuss any questions you have with your health care provider. Document Released: 10/28/2010 Document Revised: 06/15/2017 Document Reviewed: 08/16/2016 Elsevier Patient Education  2020 [employer].  Fatty Liver Disease  Fatty liver disease occurs when too much fat has built up in your liver cells. Fatty liver disease is also called hepatic steatosis or steatohepatitis. The liver removes harmful substances from your bloodstream and produces fluids that your body needs. It also helps your body use and store energy from the food you eat. In many cases, fatty liver disease does not cause symptoms or problems. It is often diagnosed when tests are being done for other reasons. However, over time, fatty liver can cause inflammation that may lead to more serious liver problems, such as scarring of the liver (cirrhosis) and liver failure. Fatty liver is associated with insulin resistance, increased  body fat, high blood pressure (hypertension), and high cholesterol. These are features of metabolic syndrome and increase your risk for stroke, diabetes, and heart disease. What are the causes? This condition may be caused by:  Drinking too much alcohol.  Poor nutrition.  Obesity.  Cushing's syndrome.  Diabetes.  High cholesterol.  Certain drugs.  Poisons.  Some viral infections.  Pregnancy. What increases the risk? You are more likely to develop this condition if you:  Abuse alcohol.  Are overweight.  Have diabetes.  Have hepatitis.  Have a high triglyceride level.  Are pregnant. What are the signs or symptoms? Fatty liver disease often does not cause symptoms. If symptoms do develop, they can include:  Fatigue.  Weakness.  Weight loss.  Confusion.  Abdominal pain.  Nausea and vomiting.  Yellowing of your skin and the white parts of your eyes (jaundice).  Itchy skin. How is this diagnosed? This condition may be diagnosed by:  A physical exam and medical history.  Blood tests.  Imaging tests, such as an ultrasound, CT scan, or MRI.  A liver biopsy. A small sample of liver tissue is removed using a needle. The sample is then looked at under a microscope. How is this treated? Fatty liver disease is often caused by other health conditions. Treatment for fatty liver may involve medicines and lifestyle changes to manage conditions such as:  Alcoholism.  High cholesterol.  Diabetes.  Being overweight or obese. Follow these instructions at home:   Do not drink alcohol. If you have trouble quitting, ask your health care provider how to safely quit with the help of medicine or a supervised program. This is important to keep your condition from getting worse.  Eat a healthy diet as told by your health care provider. Ask your health care provider about working with a diet and nutrition specialist (dietitian) to develop an eating plan.  Exercise  regularly. This can help you lose weight and control your cholesterol and diabetes. Talk to your health care provider about  an exercise plan and which activities are best for you.  Take over-the-counter and prescription medicines only as told by your health care provider.  Keep all follow-up visits as told by your health care provider. This is important. Contact a health care provider if: You have trouble controlling your:  Blood sugar. This is especially important if you have diabetes.  Cholesterol.  Drinking of alcohol. Get help right away if:  You have abdominal pain.  You have jaundice.  You have nausea and vomiting.  You vomit blood or material that looks like coffee grounds.  You have stools that are black, tar-like, or bloody. Summary  Fatty liver disease develops when too much fat builds up in the cells of your liver.  Fatty liver disease often causes no symptoms or problems. However, over time, fatty liver can cause inflammation that may lead to more serious liver problems, such as scarring of the liver (cirrhosis).  You are more likely to develop this condition if you abuse alcohol, are pregnant, are overweight, have diabetes, have hepatitis, or have high triglyceride levels.  Contact your health care provider if you have trouble controlling your weight, blood sugar, cholesterol, or drinking of alcohol. This information is not intended to replace advice given to you by your health care provider. Make sure you discuss any questions you have with your health care provider. Document Released: 08/18/2005 Document Revised: 06/15/2017 Document Reviewed: 04/11/2017 Elsevier Patient Education  2020 [employer].

## 2019-07-05 LAB — RHEUMATOID FACTOR: Rheumatoid fact SerPl-aCnc: 17 IU/mL — ABNORMAL HIGH (ref <14)

## 2019-07-05 LAB — HIV ANTIBODY (ROUTINE TESTING W REFLEX): HIV 1&2 Ab, 4th Generation: NONREACTIVE

## 2019-07-14 DIAGNOSIS — M109 Gout, unspecified: Secondary | ICD-10-CM | POA: Diagnosis not present

## 2019-07-14 DIAGNOSIS — M79671 Pain in right foot: Secondary | ICD-10-CM | POA: Diagnosis not present

## 2019-07-14 DIAGNOSIS — Z79899 Other long term (current) drug therapy: Secondary | ICD-10-CM | POA: Diagnosis not present

## 2019-07-14 DIAGNOSIS — R768 Other specified abnormal immunological findings in serum: Secondary | ICD-10-CM | POA: Diagnosis not present

## 2019-07-14 DIAGNOSIS — M79641 Pain in right hand: Secondary | ICD-10-CM | POA: Diagnosis not present

## 2019-07-14 DIAGNOSIS — M79642 Pain in left hand: Secondary | ICD-10-CM | POA: Diagnosis not present

## 2019-07-14 DIAGNOSIS — M79672 Pain in left foot: Secondary | ICD-10-CM | POA: Diagnosis not present

## 2019-08-11 DIAGNOSIS — M109 Gout, unspecified: Secondary | ICD-10-CM | POA: Diagnosis not present

## 2019-08-11 DIAGNOSIS — M1189 Other specified crystal arthropathies, multiple sites: Secondary | ICD-10-CM | POA: Diagnosis not present

## 2019-08-11 DIAGNOSIS — M79641 Pain in right hand: Secondary | ICD-10-CM | POA: Diagnosis not present

## 2019-08-11 DIAGNOSIS — Z79899 Other long term (current) drug therapy: Secondary | ICD-10-CM | POA: Diagnosis not present

## 2019-08-11 DIAGNOSIS — R768 Other specified abnormal immunological findings in serum: Secondary | ICD-10-CM | POA: Diagnosis not present

## 2019-10-02 ENCOUNTER — Other Ambulatory Visit: Payer: Self-pay

## 2019-10-03 ENCOUNTER — Encounter: Payer: Self-pay | Admitting: Family Medicine

## 2019-10-03 ENCOUNTER — Ambulatory Visit (INDEPENDENT_AMBULATORY_CARE_PROVIDER_SITE_OTHER): Payer: BC Managed Care – PPO | Admitting: Family Medicine

## 2019-10-03 VITALS — BP 150/82 | HR 97 | Temp 98.4°F | Resp 18 | Ht 70.0 in | Wt 228.8 lb

## 2019-10-03 DIAGNOSIS — I10 Essential (primary) hypertension: Secondary | ICD-10-CM | POA: Diagnosis not present

## 2019-10-03 DIAGNOSIS — Z0000 Encounter for general adult medical examination without abnormal findings: Secondary | ICD-10-CM

## 2019-10-03 DIAGNOSIS — E7800 Pure hypercholesterolemia, unspecified: Secondary | ICD-10-CM

## 2019-10-03 DIAGNOSIS — M1A9XX0 Chronic gout, unspecified, without tophus (tophi): Secondary | ICD-10-CM | POA: Diagnosis not present

## 2019-10-03 DIAGNOSIS — M109 Gout, unspecified: Secondary | ICD-10-CM

## 2019-10-03 LAB — COMPREHENSIVE METABOLIC PANEL
ALT: 29 U/L (ref 0–53)
AST: 20 U/L (ref 0–37)
Albumin: 4.1 g/dL (ref 3.5–5.2)
Alkaline Phosphatase: 57 U/L (ref 39–117)
BUN: 16 mg/dL (ref 6–23)
CO2: 27 mEq/L (ref 19–32)
Calcium: 9.1 mg/dL (ref 8.4–10.5)
Chloride: 104 mEq/L (ref 96–112)
Creatinine, Ser: 0.96 mg/dL (ref 0.40–1.50)
GFR: 79.15 mL/min (ref >60.00)
Glucose, Bld: 136 mg/dL — ABNORMAL HIGH (ref 70–99)
Potassium: 4.6 mEq/L (ref 3.5–5.1)
Sodium: 139 mEq/L (ref 135–145)
Total Bilirubin: 0.5 mg/dL (ref 0.2–1.2)
Total Protein: 6.5 g/dL (ref 6.0–8.3)

## 2019-10-03 LAB — LIPID PANEL
Cholesterol: 155 mg/dL (ref 0–200)
HDL: 40.5 mg/dL (ref >39.00)
LDL Cholesterol: 88 mg/dL (ref 0–99)
NonHDL: 114.45
Total CHOL/HDL Ratio: 4
Triglycerides: 131 mg/dL (ref 0.0–149.0)
VLDL: 26.2 mg/dL (ref 0.0–40.0)

## 2019-10-03 LAB — CBC
HCT: 43.2 % (ref 39.0–52.0)
Hemoglobin: 14.5 g/dL (ref 13.0–17.0)
MCHC: 33.7 g/dL (ref 30.0–36.0)
MCV: 91.6 fl (ref 78.0–100.0)
Platelets: 143 10*3/uL — ABNORMAL LOW (ref 150.0–400.0)
RBC: 4.72 Mil/uL (ref 4.22–5.81)
RDW: 13.9 % (ref 11.5–15.5)
WBC: 4.6 10*3/uL (ref 4.0–10.5)

## 2019-10-03 LAB — URINALYSIS, ROUTINE W REFLEX MICROSCOPIC
Bilirubin Urine: NEGATIVE
Hgb urine dipstick: NEGATIVE
Ketones, ur: NEGATIVE
Leukocytes,Ua: NEGATIVE
Nitrite: NEGATIVE
Specific Gravity, Urine: 1.025 (ref 1.000–1.030)
Total Protein, Urine: NEGATIVE
Urine Glucose: NEGATIVE
Urobilinogen, UA: 0.2 (ref 0.0–1.0)
WBC, UA: NONE SEEN — AB (ref 0–?)
pH: 6 (ref 5.0–8.0)

## 2019-10-03 LAB — LDL CHOLESTEROL, DIRECT: Direct LDL: 94 mg/dL

## 2019-10-03 LAB — URIC ACID: Uric Acid, Serum: 7 mg/dL (ref 4.0–7.8)

## 2019-10-03 LAB — PSA: PSA: 1.97 ng/mL (ref 0.10–4.00)

## 2019-10-03 MED ORDER — AMLODIPINE BESYLATE 5 MG PO TABS: 5.0000 mg | ORAL_TABLET | Freq: Every day | ORAL | 3 refills | Status: DC

## 2019-10-03 MED ORDER — INDOMETHACIN 50 MG PO CAPS: ORAL_CAPSULE | ORAL | 1 refills | Status: DC

## 2019-10-03 NOTE — Progress Notes (Signed)
Established Patient Office Visit  Subjective:  Patient ID: Eric Salas, male    DOB: [birth year]  Age: 64 y.o. MRN: [id number]  CC:  Chief Complaint  Patient presents with  . Annual Exam    HPI Eric Salas presents for   his yearly physical.  He has been doing well.  Also seeing rheumatology recently increased his allopurinol to 200 mg daily.  He continues to take colchicine.  He is only using Indocin on an as-needed basis for gouty attacks.  Blood pressure has been running higher.  Continues to take his Crestor.  Past Medical History:  Diagnosis Date  . Arthritis   . Cataract    lt. eye beginning stage  . Hyperlipidemia   . Hypertension     Past Surgical History:  Procedure Laterality Date  . COLONOSCOPY      Family History  Problem Relation Age of Onset  . Cancer Mother   . Miscarriages / [place] Mother   . Alcohol abuse Father   . Diabetes Father   . Heart attack Father   . Heart disease Father   . High Cholesterol Father   . High blood pressure Father   . Stroke Father   . Diabetes Brother   . Hearing loss Brother   . High Cholesterol Brother   . Colon cancer Brother   . Cancer Brother   . Diabetes Brother   . Drug abuse Brother   . High Cholesterol Brother   . Stomach cancer Brother   . Rectal cancer Neg Hx   . Esophageal cancer Neg Hx   . Pancreatic cancer Neg Hx   . Liver cancer Neg Hx   . Prostate cancer Neg Hx     Social History   Socioeconomic History  . Marital status: Married    Spouse name: Not on file  . Number of children: Not on file  . Years of education: Not on file  . Highest education level: Not on file  Occupational History  . Not on file  Tobacco Use  . Smoking status: Former Smoker    Types: Cigarettes    Quit date: 10/18/2005    Years since quitting: 13.9  . Smokeless tobacco: Never Used  Substance and Sexual Activity  . Alcohol use: Not Currently    Comment: quit 15 years ago  . Drug use: No  .  Sexual activity: Yes    Partners: Female  Other Topics Concern  . Not on file  Social History Narrative  . Not on file   Social Determinants of Health   Financial Resource Strain:   . Difficulty of Paying Living Expenses:   Food Insecurity:   . Worried About Charity fundraiser in the Last Year:   . Arboriculturist in the Last Year:   Transportation Needs:   . Film/video editor (Medical):   Marland Kitchen Lack of Transportation (Non-Medical):   Physical Activity:   . Days of Exercise per Week:   . Minutes of Exercise per Session:   Stress:   . Feeling of Stress :   Social Connections:   . Frequency of Communication with Friends and Family:   . Frequency of Social Gatherings with Friends and Family:   . Attends Religious Services:   . Active Member of Clubs or Organizations:   . Attends Archivist Meetings:   Marland Kitchen Marital Status:   Intimate Partner Violence:   . Fear of Current or Ex-Partner:   .  Emotionally Abused:   Marland Kitchen Physically Abused:   . Sexually Abused:     Outpatient Medications Prior to Visit  Medication Sig Dispense Refill  . allopurinol (ZYLOPRIM) 100 MG tablet Take 1 tablet (100 mg total) by mouth daily. (Patient taking differently: Take 200 mg by mouth daily. ) 90 tablet 1  . colchicine 0.6 MG tablet Take 1 tablet (0.6 mg total) by mouth 2 (two) times daily. As needed for attacks. 180 tablet 0  . diclofenac sodium (VOLTAREN) 1 % GEL Apply 2 g topically 4 (four) times daily. As needed. 100 g 1  . irbesartan (AVAPRO) 300 MG tablet Take 1 tablet (300 mg total) by mouth daily. 90 tablet 0  . rosuvastatin (CRESTOR) 20 MG tablet TAKE 1 TABLET BY MOUTH EVERY OTHER DAY 90 tablet 0  . traMADol (ULTRAM) 50 MG tablet Take 1 tablet (50 mg total) by mouth every 8 (eight) hours as needed. 30 tablet 0  . indomethacin (INDOCIN) 50 MG capsule TAKE 1 CAPSULE BY MOUTH THREE TIMES DAILY WITH MEALS AS NEEDED FOR GOUTY FLAIRS 120 capsule 0  . cimetidine (TAGAMET HB) 200 MG tablet  Take 1 tablet (200 mg total) by mouth 2 (two) times daily for 30 days. 60 tablet 0   No facility-administered medications prior to visit.    Allergies  Allergen Reactions  . Codeine Itching    ROS Review of Systems  Constitutional: Negative.   HENT: Negative.   Eyes: Negative for photophobia and visual disturbance.  Respiratory: Negative.   Cardiovascular: Negative.   Gastrointestinal: Negative.   Endocrine: Negative for polyphagia and polyuria.  Genitourinary: Negative.   Musculoskeletal: Negative for gait problem and joint swelling.  Skin: Negative for pallor and rash.  Allergic/Immunologic: Negative for immunocompromised state.  Neurological: Negative for light-headedness and headaches.  Hematological: Does not bruise/bleed easily.  Psychiatric/Behavioral: Negative.       Objective:    Physical Exam  Constitutional: He is oriented to person, place, and time. He appears well-developed and well-nourished. No distress.  HENT:  Head: Normocephalic and atraumatic.  Right Ear: External ear normal.  Left Ear: External ear normal.  Eyes: Conjunctivae are normal. Right eye exhibits no discharge. Left eye exhibits no discharge. No scleral icterus.  Neck: No JVD present. No tracheal deviation present. No thyromegaly present.  Cardiovascular: Normal rate, regular rhythm and normal heart sounds.  Pulmonary/Chest: Effort normal and breath sounds normal. No stridor.  Abdominal: Bowel sounds are normal. He exhibits no distension. There is no abdominal tenderness. There is no rebound and no guarding.  Genitourinary: Rectum:     Guaiac result negative.     No rectal mass, anal fissure, tenderness, external hemorrhoid, internal hemorrhoid or abnormal anal tone.  Prostate is not enlarged and not tender.  Musculoskeletal:        General: No edema.  Lymphadenopathy:    He has no cervical adenopathy.  Neurological: He is alert and oriented to person, place, and time.  Skin: Skin is  warm and dry. He is not diaphoretic.  Psychiatric: He has a normal mood and affect. His behavior is normal.    BP (!) 150/82 (BP Location: Left Arm, Patient Position: Sitting, Cuff Size: Normal)   Pulse 97   Temp 98.4 F (36.9 C) (Temporal)   Resp 18   Ht 5\' 10"  (1.778 m)   Wt 228 lb 12.8 oz (103.8 kg)   SpO2 98%   BMI 32.83 kg/m  Wt Readings from Last 3 Encounters:  10/03/19 228 lb  12.8 oz (103.8 kg)  07/04/19 223 lb 12.8 oz (101.5 kg)  01/24/19 220 lb (99.8 kg)     Health Maintenance Due  Topic Date Due  . TETANUS/TDAP  Never done    There are no preventive care reminders to display for this patient.  Lab Results  Component Value Date   TSH 1.08 06/11/2017   Lab Results  Component Value Date   WBC 5.4 07/04/2019   HGB 13.7 07/04/2019   HCT 40.7 07/04/2019   MCV 91.2 07/04/2019   PLT 192.0 07/04/2019   Lab Results  Component Value Date   NA 141 07/04/2019   K 4.5 07/04/2019   CO2 29 07/04/2019   GLUCOSE 99 07/04/2019   BUN 16 07/04/2019   CREATININE 0.94 07/04/2019   BILITOT 0.4 07/04/2019   ALKPHOS 54 07/04/2019   AST 15 07/04/2019   ALT 21 07/04/2019   PROT 6.3 07/04/2019   ALBUMIN 4.0 07/04/2019   CALCIUM 8.8 07/04/2019   ANIONGAP 10 01/03/2019   GFR 81.16 07/04/2019   Lab Results  Component Value Date   CHOL 140 07/04/2019   Lab Results  Component Value Date   HDL 40.10 07/04/2019   Lab Results  Component Value Date   LDLCALC 85 07/04/2019   Lab Results  Component Value Date   TRIG 72.0 07/04/2019   Lab Results  Component Value Date   CHOLHDL 3 07/04/2019   Lab Results  Component Value Date   HGBA1C 6.2 07/04/2019      Assessment & Plan:   Problem List Items Addressed This Visit      Cardiovascular and Mediastinum   Essential hypertension   Relevant Medications   amLODipine (NORVASC) 5 MG tablet   Other Relevant Orders   CBC   Comprehensive metabolic panel   Urinalysis, Routine w reflex microscopic      Musculoskeletal and Integument   Chronic gout involving toe of left foot without tophus   Relevant Medications   indomethacin (INDOCIN) 50 MG capsule   Other Relevant Orders   Uric acid     Other   Elevated LDL cholesterol level   Relevant Orders   LDL cholesterol, direct   Lipid panel   Healthcare maintenance - Primary   Relevant Orders   PSA      Meds ordered this encounter  Medications  . indomethacin (INDOCIN) 50 MG capsule    Sig: TAKE 1 CAPSULE BY MOUTH THREE TIMES DAILY WITH MEALS AS NEEDED FOR GOUTY FLAIRS    Dispense:  120 capsule    Refill:  1  . amLODipine (NORVASC) 5 MG tablet    Sig: Take 1 tablet (5 mg total) by mouth daily.    Dispense:  90 tablet    Refill:  3    Follow-up: Return in about 6 months (around 04/04/2020).   Patient was given information on health maintenance and disease prevention.  Have added 5 mg of Norvasc to help lower his blood pressure.  Discussed using colchicine for the first 6 months of allopurinol therapy.  Discussed the importance of lowering cholesterol and fat to prevent further attacks of pancreatitis in treatment of hepatic steatosis. Libby Maw, MD

## 2019-10-03 NOTE — Patient Instructions (Signed)
Health Maintenance, Male Adopting a healthy lifestyle and getting preventive care are important in promoting health and wellness. Ask your health care provider about:  The right schedule for you to have regular tests and exams.  Things you can do on your own to prevent diseases and keep yourself healthy. What should I know about diet, weight, and exercise? Eat a healthy diet   Eat a diet that includes plenty of vegetables, fruits, low-fat dairy products, and lean protein.  Do not eat a lot of foods that are high in solid fats, added sugars, or sodium. Maintain a healthy weight Body mass index (BMI) is a measurement that can be used to identify possible weight problems. It estimates body fat based on height and weight. Your health care provider can help determine your BMI and help you achieve or maintain a healthy weight. Get regular exercise Get regular exercise. This is one of the most important things you can do for your health. Most adults should:  Exercise for at least 150 minutes each week. The exercise should increase your heart rate and make you sweat (moderate-intensity exercise).  Do strengthening exercises at least twice a week. This is in addition to the moderate-intensity exercise.  Spend less time sitting. Even light physical activity can be beneficial. Watch cholesterol and blood lipids Have your blood tested for lipids and cholesterol at 63 years of age, then have this test every 5 years. You may need to have your cholesterol levels checked more often if:  Your lipid or cholesterol levels are high.  You are older than 63 years of age.  You are at high risk for heart disease. What should I know about cancer screening? Many types of cancers can be detected early and may often be prevented. Depending on your health history and family history, you may need to have cancer screening at various ages. This may include screening for:  Colorectal cancer.  Prostate  cancer.  Skin cancer.  Lung cancer. What should I know about heart disease, diabetes, and high blood pressure? Blood pressure and heart disease  High blood pressure causes heart disease and increases the risk of stroke. This is more likely to develop in people who have high blood pressure readings, are of African descent, or are overweight.  Talk with your health care provider about your target blood pressure readings.  Have your blood pressure checked: ? Every 3-5 years if you are 18-39 years of age. ? Every year if you are 40 years old or older.  If you are between the ages of 65 and 75 and are a current or former smoker, ask your health care provider if you should have a one-time screening for abdominal aortic aneurysm (AAA). Diabetes Have regular diabetes screenings. This checks your fasting blood sugar level. Have the screening done:  Once every three years after age 45 if you are at a normal weight and have a low risk for diabetes.  More often and at a younger age if you are overweight or have a high risk for diabetes. What should I know about preventing infection? Hepatitis B If you have a higher risk for hepatitis B, you should be screened for this virus. Talk with your health care provider to find out if you are at risk for hepatitis B infection. Hepatitis C Blood testing is recommended for:  Everyone born from 1945 through 1965.  Anyone with known risk factors for hepatitis C. Sexually transmitted infections (STIs)  You should be screened each year   for STIs, including gonorrhea and chlamydia, if: ? You are sexually active and are younger than 63 years of age. ? You are older than 63 years of age and your health care provider tells you that you are at risk for this type of infection. ? Your sexual activity has changed since you were last screened, and you are at increased risk for chlamydia or gonorrhea. Ask your health care provider if you are at risk.  Ask your  health care provider about whether you are at high risk for HIV. Your health care provider may recommend a prescription medicine to help prevent HIV infection. If you choose to take medicine to prevent HIV, you should first get tested for HIV. You should then be tested every 3 months for as long as you are taking the medicine. Follow these instructions at home: Lifestyle  Do not use any products that contain nicotine or tobacco, such as cigarettes, e-cigarettes, and chewing tobacco. If you need help quitting, ask your health care provider.  Do not use street drugs.  Do not share needles.  Ask your health care provider for help if you need support or information about quitting drugs. Alcohol use  Do not drink alcohol if your health care provider tells you not to drink.  If you drink alcohol: ? Limit how much you have to 0-2 drinks a day. ? Be aware of how much alcohol is in your drink. In the [place] one drink equals one 12 oz bottle of beer (355 mL), one 5 oz glass of wine (148 mL), or one 1 oz glass of hard liquor (44 mL). General instructions  Schedule regular health, dental, and eye exams.  Stay current with your vaccines.  Tell your health care provider if: ? You often feel depressed. ? You have ever been abused or do not feel safe at home. Summary  Adopting a healthy lifestyle and getting preventive care are important in promoting health and wellness.  Follow your health care provider's instructions about healthy diet, exercising, and getting tested or screened for diseases.  Follow your health care provider's instructions on monitoring your cholesterol and blood pressure. This information is not intended to replace advice given to you by your health care provider. Make sure you discuss any questions you have with your health care provider. Document Revised: 06/26/2018 Document Reviewed: 06/26/2018 Elsevier Patient Education  2020 [employer]  Preventive Care 40-64 Years  Old, Male Preventive care refers to lifestyle choices and visits with your health care provider that can promote health and wellness. This includes:  A yearly physical exam. This is also called an annual well check.  Regular dental and eye exams.  Immunizations.  Screening for certain conditions.  Healthy lifestyle choices, such as eating a healthy diet, getting regular exercise, not using drugs or products that contain nicotine and tobacco, and limiting alcohol use. What can I expect for my preventive care visit? Physical exam Your health care provider will check:  Height and weight. These may be used to calculate body mass index (BMI), which is a measurement that tells if you are at a healthy weight.  Heart rate and blood pressure.  Your skin for abnormal spots. Counseling Your health care provider may ask you questions about:  Alcohol, tobacco, and drug use.  Emotional well-being.  Home and relationship well-being.  Sexual activity.  Eating habits.  Work and work environment. What immunizations do I need?  Influenza (flu) vaccine  This is recommended every year. Tetanus, diphtheria,   and pertussis (Tdap) vaccine  You may need a Td booster every 10 years. Varicella (chickenpox) vaccine  You may need this vaccine if you have not already been vaccinated. Zoster (shingles) vaccine  You may need this after age 63. Measles, mumps, and rubella (MMR) vaccine  You may need at least one dose of MMR if you were born in 1957 or later. You may also need a second dose. Pneumococcal conjugate (PCV13) vaccine  You may need this if you have certain conditions and were not previously vaccinated. Pneumococcal polysaccharide (PPSV23) vaccine  You may need one or two doses if you smoke cigarettes or if you have certain conditions. Meningococcal conjugate (MenACWY) vaccine  You may need this if you have certain conditions. Hepatitis A vaccine  You may need this if you have  certain conditions or if you travel or work in places where you may be exposed to hepatitis A. Hepatitis B vaccine  You may need this if you have certain conditions or if you travel or work in places where you may be exposed to hepatitis B. Haemophilus influenzae type b (Hib) vaccine  You may need this if you have certain risk factors. Human papillomavirus (HPV) vaccine  If recommended by your health care provider, you may need three doses over 6 months. You may receive vaccines as individual doses or as more than one vaccine together in one shot (combination vaccines). Talk with your health care provider about the risks and benefits of combination vaccines. What tests do I need? Blood tests  Lipid and cholesterol levels. These may be checked every 5 years, or more frequently if you are over 68 years old.  Hepatitis C test.  Hepatitis B test. Screening  Lung cancer screening. You may have this screening every year starting at age 78 if you have a 30-pack-year history of smoking and currently smoke or have quit within the past 15 years.  Prostate cancer screening. Recommendations will vary depending on your family history and other risks.  Colorectal cancer screening. All adults should have this screening starting at age 38 and continuing until age 22. Your health care provider may recommend screening at age 73 if you are at increased risk. You will have tests every 1-10 years, depending on your results and the type of screening test.  Diabetes screening. This is done by checking your blood sugar (glucose) after you have not eaten for a while (fasting). You may have this done every 1-3 years.  Sexually transmitted disease (STD) testing. Follow these instructions at home: Eating and drinking  Eat a diet that includes fresh fruits and vegetables, whole grains, lean protein, and low-fat dairy products.  Take vitamin and mineral supplements as recommended by your health care  provider.  Do not drink alcohol if your health care provider tells you not to drink.  If you drink alcohol: ? Limit how much you have to 0-2 drinks a day. ? Be aware of how much alcohol is in your drink. In the [place] one drink equals one 12 oz bottle of beer (355 mL), one 5 oz glass of wine (148 mL), or one 1 oz glass of hard liquor (44 mL). Lifestyle  Take daily care of your teeth and gums.  Stay active. Exercise for at least 30 minutes on 5 or more days each week.  Do not use any products that contain nicotine or tobacco, such as cigarettes, e-cigarettes, and chewing tobacco. If you need help quitting, ask your health care provider.  If  you are sexually active, practice safe sex. Use a condom or other form of protection to prevent STIs (sexually transmitted infections).  Talk with your health care provider about taking a low-dose aspirin every day starting at age 50. What's next?  Go to your health care provider once a year for a well check visit.  Ask your health care provider how often you should have your eyes and teeth checked.  Stay up to date on all vaccines. This information is not intended to replace advice given to you by your health care provider. Make sure you discuss any questions you have with your health care provider. Document Revised: 06/27/2018 Document Reviewed: 06/27/2018 Elsevier Patient Education  2020 Elsevier Inc.  

## 2019-10-07 DIAGNOSIS — H0288B Meibomian gland dysfunction left eye, upper and lower eyelids: Secondary | ICD-10-CM | POA: Diagnosis not present

## 2019-10-07 DIAGNOSIS — D3131 Benign neoplasm of right choroid: Secondary | ICD-10-CM | POA: Diagnosis not present

## 2019-10-07 DIAGNOSIS — H0288A Meibomian gland dysfunction right eye, upper and lower eyelids: Secondary | ICD-10-CM | POA: Diagnosis not present

## 2019-10-07 DIAGNOSIS — H25812 Combined forms of age-related cataract, left eye: Secondary | ICD-10-CM | POA: Diagnosis not present

## 2019-10-13 ENCOUNTER — Other Ambulatory Visit: Payer: Self-pay | Admitting: Family Medicine

## 2019-10-13 DIAGNOSIS — I10 Essential (primary) hypertension: Secondary | ICD-10-CM

## 2019-10-15 DIAGNOSIS — D3131 Benign neoplasm of right choroid: Secondary | ICD-10-CM | POA: Diagnosis not present

## 2019-10-15 DIAGNOSIS — H2512 Age-related nuclear cataract, left eye: Secondary | ICD-10-CM | POA: Diagnosis not present

## 2019-10-15 DIAGNOSIS — H2511 Age-related nuclear cataract, right eye: Secondary | ICD-10-CM | POA: Diagnosis not present

## 2019-11-17 DIAGNOSIS — Z79899 Other long term (current) drug therapy: Secondary | ICD-10-CM | POA: Diagnosis not present

## 2019-11-17 DIAGNOSIS — M79641 Pain in right hand: Secondary | ICD-10-CM | POA: Diagnosis not present

## 2019-11-17 DIAGNOSIS — M79646 Pain in unspecified finger(s): Secondary | ICD-10-CM | POA: Diagnosis not present

## 2019-11-17 DIAGNOSIS — R768 Other specified abnormal immunological findings in serum: Secondary | ICD-10-CM | POA: Diagnosis not present

## 2019-11-17 DIAGNOSIS — M109 Gout, unspecified: Secondary | ICD-10-CM | POA: Diagnosis not present

## 2019-12-17 ENCOUNTER — Other Ambulatory Visit: Payer: Self-pay | Admitting: Family Medicine

## 2019-12-17 DIAGNOSIS — E7800 Pure hypercholesterolemia, unspecified: Secondary | ICD-10-CM

## 2020-01-11 ENCOUNTER — Other Ambulatory Visit: Payer: Self-pay | Admitting: Family Medicine

## 2020-01-11 DIAGNOSIS — I10 Essential (primary) hypertension: Secondary | ICD-10-CM

## 2020-02-12 ENCOUNTER — Encounter (INDEPENDENT_AMBULATORY_CARE_PROVIDER_SITE_OTHER): Payer: BC Managed Care – PPO | Admitting: Ophthalmology

## 2020-02-18 ENCOUNTER — Other Ambulatory Visit: Payer: Self-pay

## 2020-02-18 ENCOUNTER — Encounter (INDEPENDENT_AMBULATORY_CARE_PROVIDER_SITE_OTHER): Payer: Self-pay | Admitting: Ophthalmology

## 2020-02-18 ENCOUNTER — Ambulatory Visit (INDEPENDENT_AMBULATORY_CARE_PROVIDER_SITE_OTHER): Payer: BC Managed Care – PPO | Admitting: Ophthalmology

## 2020-02-18 DIAGNOSIS — H2512 Age-related nuclear cataract, left eye: Secondary | ICD-10-CM | POA: Diagnosis not present

## 2020-02-18 DIAGNOSIS — D3131 Benign neoplasm of right choroid: Secondary | ICD-10-CM

## 2020-02-18 DIAGNOSIS — H2511 Age-related nuclear cataract, right eye: Secondary | ICD-10-CM | POA: Diagnosis not present

## 2020-02-18 NOTE — Progress Notes (Signed)
02/18/2020     CHIEF COMPLAINT Patient presents for Retina Follow Up   HISTORY OF PRESENT ILLNESS: Eric Salas is a 64 y.o. male who presents to the clinic today for:   HPI    Retina Follow Up    Patient presents with  Other.  In right eye.  This started 5 months ago.  Severity is mild.  Duration of 5 months.  Since onset it is stable.          Comments    5 Month Nevus F/U OD  Pt denies noticeable changes to [place] OU since last visit. Pt denies ocular pain, flashes of light, or floaters OU.         Last edited by Rockie Neighbours, [place] on 02/18/2020  8:14 AM. (History)      Referring physician: Libby Maw, MD [place],  [place] [id number]  HISTORICAL INFORMATION:   Selected notes from the MEDICAL RECORD NUMBER    Lab Results  Component Value Date   HGBA1C 6.2 07/04/2019     CURRENT MEDICATIONS: No current outpatient medications on file. (Ophthalmic Drugs)   No current facility-administered medications for this visit. (Ophthalmic Drugs)   Current Outpatient Medications (Other)  Medication Sig  . allopurinol (ZYLOPRIM) 100 MG tablet Take 1 tablet (100 mg total) by mouth daily. (Patient taking differently: Take 200 mg by mouth daily. )  . amLODipine (NORVASC) 5 MG tablet Take 1 tablet (5 mg total) by mouth daily.  . cimetidine (TAGAMET HB) 200 MG tablet Take 1 tablet (200 mg total) by mouth 2 (two) times daily for 30 days.  . colchicine 0.6 MG tablet Take 1 tablet (0.6 mg total) by mouth 2 (two) times daily. As needed for attacks.  . diclofenac sodium (VOLTAREN) 1 % GEL Apply 2 g topically 4 (four) times daily. As needed.  . indomethacin (INDOCIN) 50 MG capsule TAKE 1 CAPSULE BY MOUTH THREE TIMES DAILY WITH MEALS AS NEEDED FOR GOUTY FLAIRS  . irbesartan (AVAPRO) 300 MG tablet Take 1 tablet by mouth once daily  . rosuvastatin (CRESTOR) 20 MG tablet TAKE 1 TABLET BY MOUTH EVERY OTHER DAY  . traMADol (ULTRAM) 50 MG tablet Take 1 tablet  (50 mg total) by mouth every 8 (eight) hours as needed.   No current facility-administered medications for this visit. (Other)      REVIEW OF SYSTEMS:    ALLERGIES Allergies  Allergen Reactions  . Codeine Itching    PAST MEDICAL HISTORY Past Medical History:  Diagnosis Date  . Arthritis   . Cataract    lt. eye beginning stage  . Hyperlipidemia   . Hypertension    Past Surgical History:  Procedure Laterality Date  . COLONOSCOPY      FAMILY HISTORY Family History  Problem Relation Age of Onset  . Cancer Mother   . Miscarriages / [place] Mother   . Alcohol abuse Father   . Diabetes Father   . Heart attack Father   . Heart disease Father   . High Cholesterol Father   . High blood pressure Father   . Stroke Father   . Diabetes Brother   . Hearing loss Brother   . High Cholesterol Brother   . Colon cancer Brother   . Cancer Brother   . Diabetes Brother   . Drug abuse Brother   . High Cholesterol Brother   . Stomach cancer Brother   . Rectal cancer Neg Hx   . Esophageal cancer  Neg Hx   . Pancreatic cancer Neg Hx   . Liver cancer Neg Hx   . Prostate cancer Neg Hx     SOCIAL HISTORY Social History   Tobacco Use  . Smoking status: Former Smoker    Types: Cigarettes    Quit date: 10/18/2005    Years since quitting: 14.3  . Smokeless tobacco: Never Used  Vaping Use  . Vaping Use: Never used  Substance Use Topics  . Alcohol use: Not Currently    Comment: quit 15 years ago  . Drug use: No         OPHTHALMIC EXAM: Base Eye Exam    Visual Acuity (ETDRS)      Right Left   Dist [place] 20/20 -1 20/20 -2       Tonometry (Tonopen, 8:17 AM)      Right Left   Pressure 14 13       Pupils      Pupils Dark Light Shape React APD   Right PERRL 4 3 Round Brisk None   Left PERRL 4 3 Round Brisk None       Visual Fields (Counting fingers)      Left Right    Full Full       Extraocular Movement      Right Left    Full Full       Neuro/Psych     Oriented x3: Yes   Mood/Affect: Normal       Dilation    Right eye: 1.0% Mydriacyl, 2.5% Phenylephrine @ 8:17 AM        Slit Lamp and Fundus Exam    External Exam      Right Left   External Normal Normal       Slit Lamp Exam      Right Left   Lids/Lashes Normal Normal   Conjunctiva/Sclera White and quiet White and quiet   Cornea Clear Clear   Anterior Chamber Deep and quiet Deep and quiet   Iris Round and reactive Round and reactive   Lens 1+ Nuclear sclerosis 1+ Nuclear sclerosis   Anterior Vitreous Normal Normal       Fundus Exam      Right Left   Posterior Vitreous Normal    Disc Normal    C/D Ratio 0.25    Macula Normal    Vessels Normal    Periphery 2.2 x 2 dd flat nevus temporal to macula, with no high risk features.  Subretinal fluid, no atrophy but no discernible thickness.           IMAGING AND PROCEDURES  Imaging and Procedures for 02/18/20  Color Fundus Photography Optos - OU - Both Eyes       Right Eye Progression has been stable. Disc findings include normal observations. Macula : normal observations. Vessels : normal observations.   Left Eye Progression has been stable. Disc findings include normal observations. Macula : normal observations. Vessels : normal observations.   Notes Choroidal nevus temporal to the macula, 2 disc diameters in size, flat somewhat irregular pigmentation, no lipofuscin no atrophy no fluid no discernible thickness       B-Scan Ultrasound - OD - Right Eye       Quality was good.   Notes All scans on the posterior pole macular region looking for no discernible thickness to this choroidal nevus are flat.  There is no thickness at all  ASSESSMENT/PLAN:  Choroidal nevus of right eye No change in interval over the last 4 months in size or appearance to the nevus of the posterior pole temporal to the macula OD.  Documentation B-scan confirmed this.  Will observe follow-up 6 months  Nuclear  sclerotic cataract of left eye The nature of cataract was discussed with the patient as well as the elective nature of surgery. The patient was reassured that surgery at a later date does not put the patient at risk for a worse outcome. It was emphasized that the need for surgery is dictated by the patient's quality of life as influenced by the cataract. Patient was instructed to maintain close follow up with their general eye care doctor.      ICD-10-CM   1. Choroidal nevus of right eye  D31.31 Color Fundus Photography Optos - OU - Both Eyes    B-Scan Ultrasound - OD - Right Eye  2. Nuclear sclerotic cataract of right eye  H25.11   3. Nuclear sclerotic cataract of left eye  H25.12     1.  2.  3.  Ophthalmic Meds Ordered this visit:  No orders of the defined types were placed in this encounter.      Return in about 6 months (around 08/20/2020) for COLOR FP, B-Scan U/S, OD, dilate.  There are no Patient Instructions on file for this visit.   Explained the diagnoses, plan, and follow up with the patient and they expressed understanding.  Patient expressed understanding of the importance of proper follow up care.   Clent Demark Alycia Cooperwood M.D. Diseases & Surgery of the Retina and Vitreous [facility] 02/18/20     Abbreviations: M myopia (nearsighted); A astigmatism; H hyperopia (farsighted); P presbyopia; Mrx spectacle prescription;  CTL contact lenses; OD right eye; OS left eye; OU both eyes  XT exotropia; ET esotropia; PEK punctate epithelial keratitis; PEE punctate epithelial erosions; DES dry eye syndrome; MGD meibomian gland dysfunction; ATs artificial tears; PFAT's preservative free artificial tears; [place] nuclear sclerotic cataract; PSC posterior subcapsular cataract; ERM epi-retinal membrane; PVD posterior vitreous detachment; RD retinal detachment; DM diabetes mellitus; DR diabetic retinopathy; NPDR non-proliferative diabetic retinopathy; PDR proliferative diabetic  retinopathy; CSME clinically significant macular edema; DME diabetic macular edema; dbh dot blot hemorrhages; CWS cotton wool spot; POAG primary open angle glaucoma; C/D cup-to-disc ratio; HVF humphrey visual field; GVF goldmann visual field; OCT optical coherence tomography; IOP intraocular pressure; BRVO Branch retinal vein occlusion; CRVO central retinal vein occlusion; CRAO central retinal artery occlusion; BRAO branch retinal artery occlusion; RT retinal tear; SB scleral buckle; PPV pars plana vitrectomy; VH Vitreous hemorrhage; PRP panretinal laser photocoagulation; IVK intravitreal kenalog; VMT vitreomacular traction; MH Macular hole;  NVD neovascularization of the disc; NVE neovascularization elsewhere; AREDS age related eye disease study; ARMD age related macular degeneration; POAG primary open angle glaucoma; EBMD epithelial/anterior basement membrane dystrophy; ACIOL anterior chamber intraocular lens; IOL intraocular lens; PCIOL posterior chamber intraocular lens; Phaco/IOL phacoemulsification with intraocular lens placement; Royal photorefractive keratectomy; LASIK laser assisted in situ keratomileusis; HTN hypertension; DM diabetes mellitus; COPD chronic obstructive pulmonary disease

## 2020-02-18 NOTE — Assessment & Plan Note (Signed)
No change in interval over the last 4 months in size or appearance to the nevus of the posterior pole temporal to the macula OD.  Documentation B-scan confirmed this.  Will observe follow-up 6 months

## 2020-02-18 NOTE — Assessment & Plan Note (Signed)

## 2020-03-10 DIAGNOSIS — R768 Other specified abnormal immunological findings in serum: Secondary | ICD-10-CM | POA: Diagnosis not present

## 2020-03-10 DIAGNOSIS — Z79899 Other long term (current) drug therapy: Secondary | ICD-10-CM | POA: Diagnosis not present

## 2020-03-10 DIAGNOSIS — M79641 Pain in right hand: Secondary | ICD-10-CM | POA: Diagnosis not present

## 2020-03-10 DIAGNOSIS — M109 Gout, unspecified: Secondary | ICD-10-CM | POA: Diagnosis not present

## 2020-03-25 DIAGNOSIS — Z713 Dietary counseling and surveillance: Secondary | ICD-10-CM | POA: Diagnosis not present

## 2020-04-05 ENCOUNTER — Ambulatory Visit: Payer: BC Managed Care – PPO | Admitting: Family Medicine

## 2020-04-18 ENCOUNTER — Other Ambulatory Visit: Payer: Self-pay | Admitting: Family Medicine

## 2020-04-18 DIAGNOSIS — I10 Essential (primary) hypertension: Secondary | ICD-10-CM

## 2020-04-22 ENCOUNTER — Ambulatory Visit: Payer: BC Managed Care – PPO | Admitting: Family Medicine

## 2020-05-12 DIAGNOSIS — Z23 Encounter for immunization: Secondary | ICD-10-CM | POA: Diagnosis not present

## 2020-06-04 IMAGING — US ULTRASOUND ABDOMEN COMPLETE
1 series · 13 of 25 positions shown · non-contrast
Comparison: None.

CLINICAL DATA: Acute epigastric pain

EXAM:
ABDOMEN ULTRASOUND COMPLETE

[Series 1: ultrasound abdomen complete · 13 of 131 slices shown]
[im 1/131]
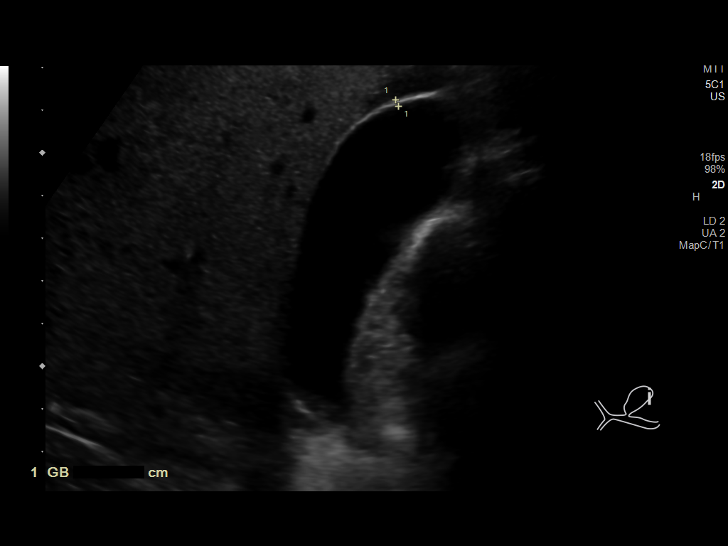
[im 11/131]
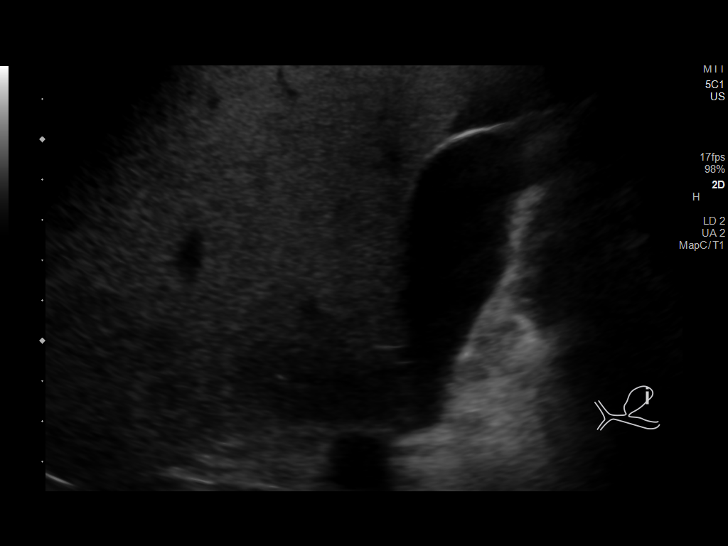
[im 22/131]
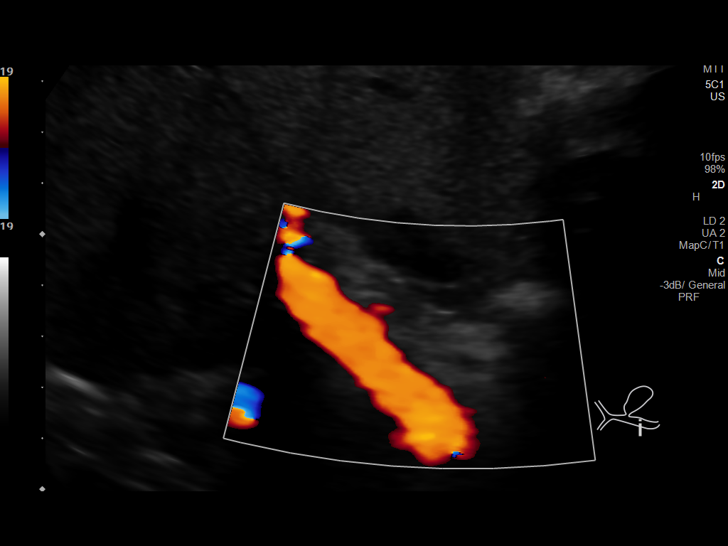
[im 33/131]
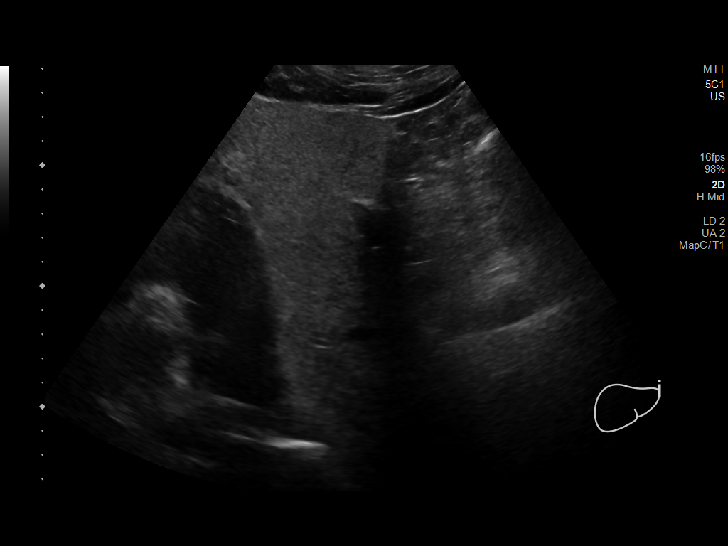
[im 44/131]
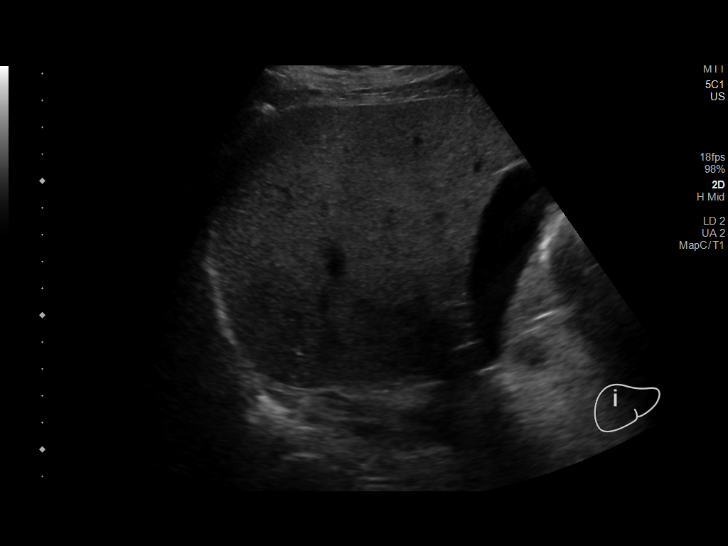
[im 55/131]
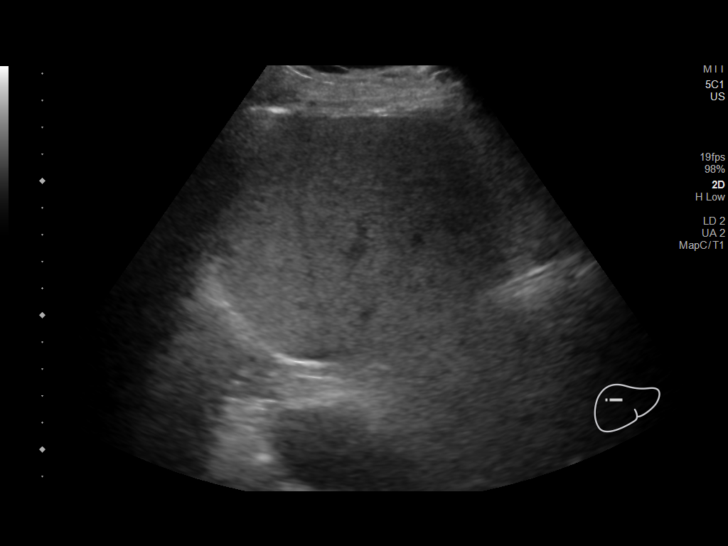
[im 66/131]
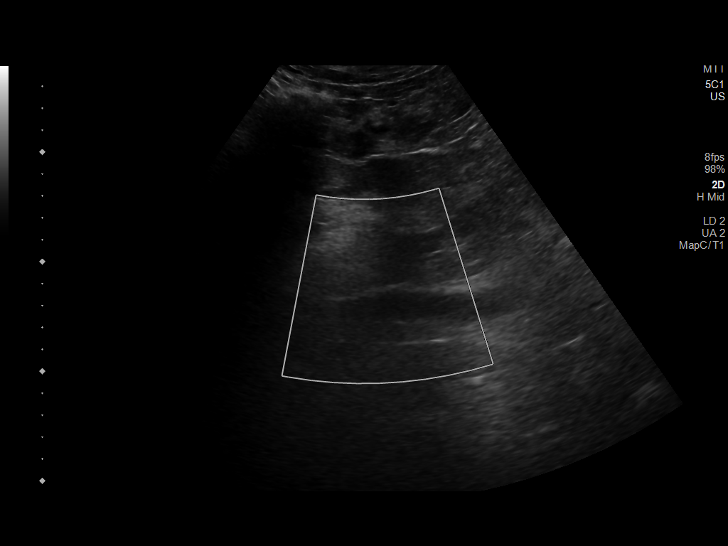
[im 76/131]
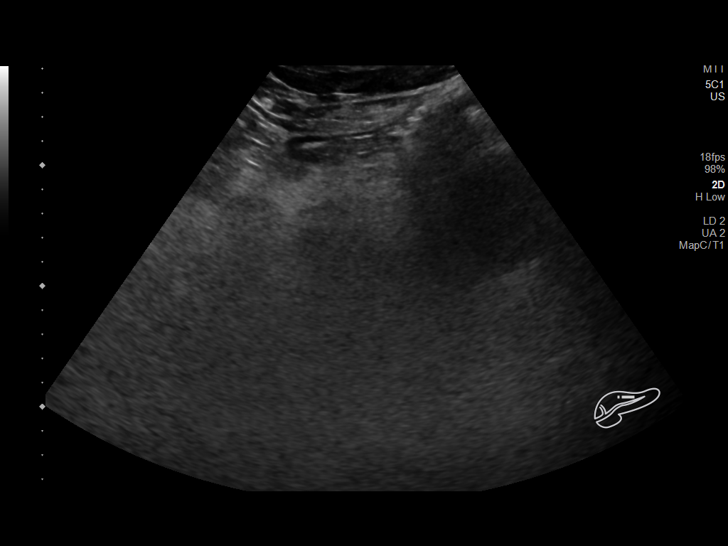
[im 87/131]
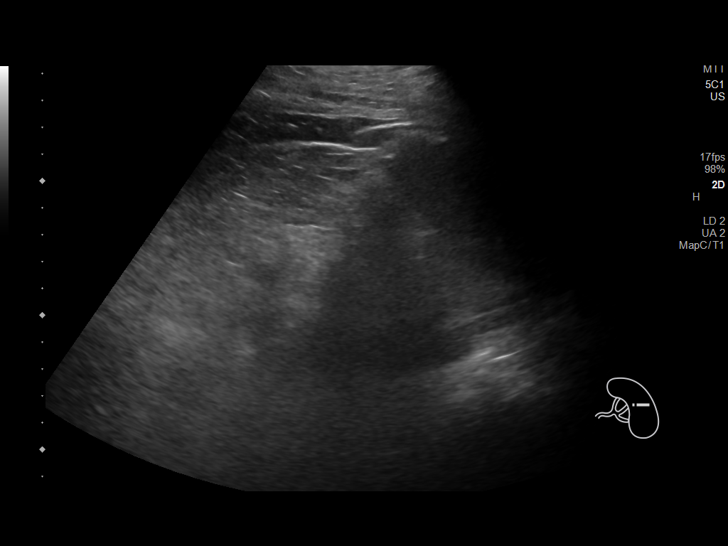
[im 98/131]
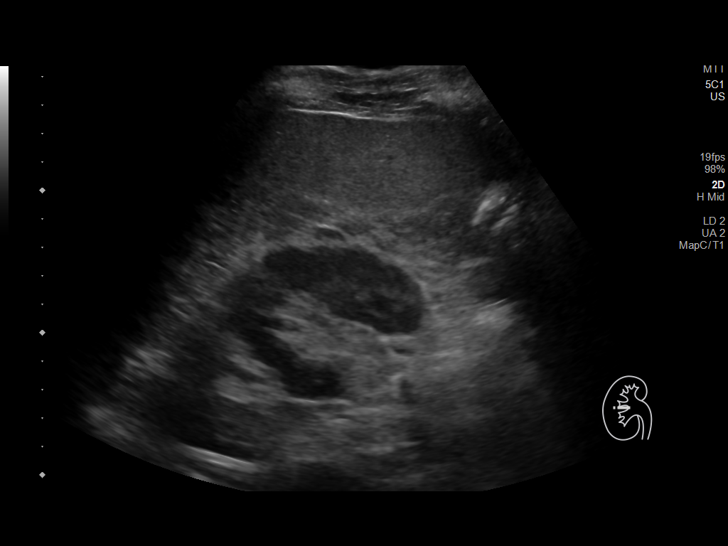
[im 109/131]
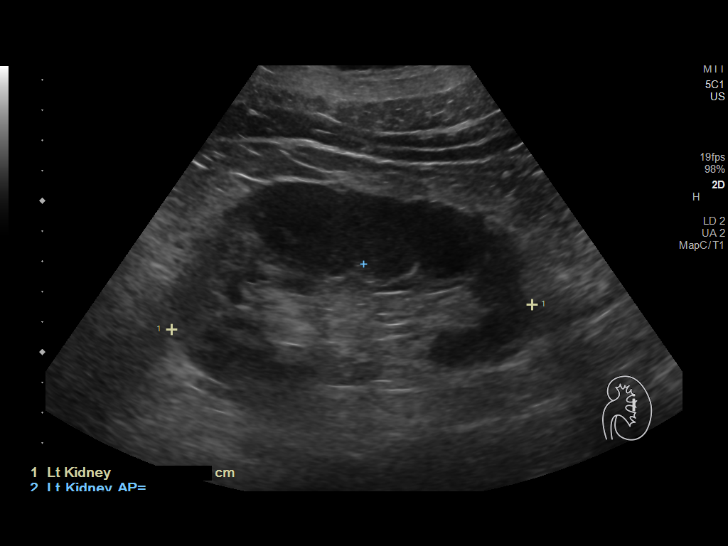
[im 120/131]
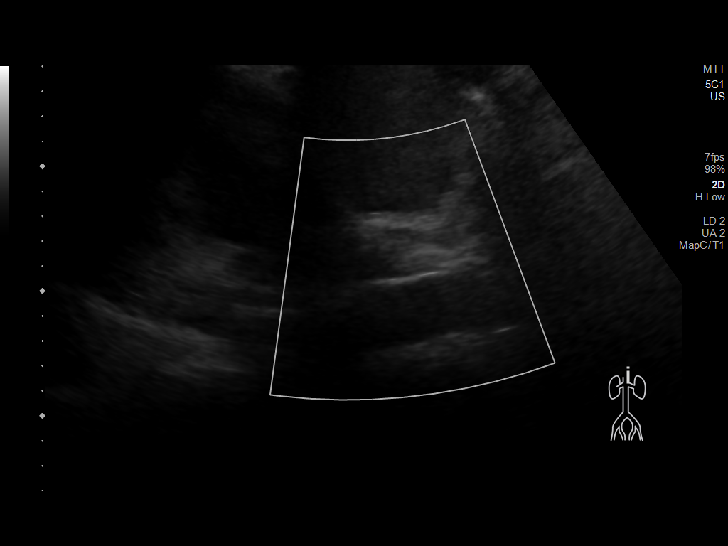
[im 131/131]
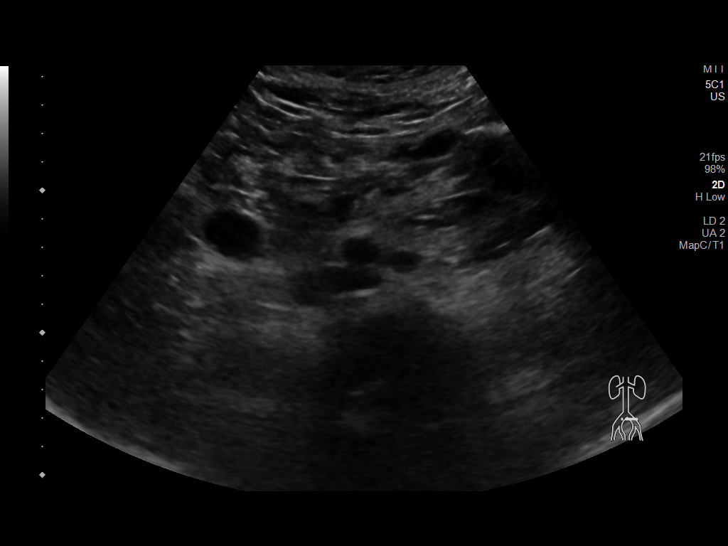

[13 of 25 positions shown; findings below may reference images not displayed]

FINDINGS: Gallbladder: No gallstones or wall thickening visualized. No
sonographic Murphy sign noted by sonographer.

Common bile duct: Diameter: 3.8 mm

Liver: Coarse heterogeneous increased echogenicity compatible with
hepatic steatosis. No biliary dilatation.

Triangular ill-defined hypoechoic area or lesion in the posterior
right hepatic lobe close to the hepatic venous confluence measures
5.5 x 3.0 x 3.7 cm. This could represent an area fatty sparing
versus underlying lesion. Consider follow-up evaluation with
nonemergent MRI. Portal vein is patent on color Doppler imaging with
normal direction of blood flow towards the liver.

IVC: No abnormality visualized.

Pancreas: Visualized portion unremarkable. Pancreatic duct is
visualized measuring 3 mm.

Spleen: Size and appearance within normal limits.

Right Kidney: Length: 11.7 cm. Normal echogenicity and cortical
thickness. Small cortical anechoic cyst measures 7 mm inferiorly. No
hydronephrosis.

Left Kidney: Length: 11.9 cm. Normal echogenicity and cortical
thickness. Upper pole anechoic cortical cyst measures 1.8 cm. No
hydronephrosis.

Abdominal aorta: No aneurysm visualized.

Other findings: No free fluid or ascites.
IMPRESSION: Hepatic steatosis with indeterminate hypoechoic area or lesion in
the posterior right liver measuring up to 5.5 cm. Considerations
include area of fatty sparing versus underlying lesion. Consider
follow-up evaluation with nonemergent MRI.

Negative for gallstones or biliary dilatation

Incidental renal cysts

Aortic atherosclerosis without aneurysm

## 2020-06-08 DIAGNOSIS — Z713 Dietary counseling and surveillance: Secondary | ICD-10-CM | POA: Diagnosis not present

## 2020-06-14 ENCOUNTER — Other Ambulatory Visit: Payer: Self-pay | Admitting: Family Medicine

## 2020-06-14 DIAGNOSIS — M109 Gout, unspecified: Secondary | ICD-10-CM

## 2020-06-14 NOTE — Telephone Encounter (Signed)
Pt stated he has about 6 pill left. Rx sent.   Pt schedule to see Dr. Ethelene Hal in 07/20/2020.

## 2020-06-16 DIAGNOSIS — R768 Other specified abnormal immunological findings in serum: Secondary | ICD-10-CM | POA: Diagnosis not present

## 2020-06-16 DIAGNOSIS — Z79899 Other long term (current) drug therapy: Secondary | ICD-10-CM | POA: Diagnosis not present

## 2020-06-16 DIAGNOSIS — M109 Gout, unspecified: Secondary | ICD-10-CM | POA: Diagnosis not present

## 2020-06-16 DIAGNOSIS — M79641 Pain in right hand: Secondary | ICD-10-CM | POA: Diagnosis not present

## 2020-06-26 ENCOUNTER — Other Ambulatory Visit: Payer: Self-pay | Admitting: Family Medicine

## 2020-06-26 DIAGNOSIS — E7800 Pure hypercholesterolemia, unspecified: Secondary | ICD-10-CM

## 2020-07-05 IMAGING — CT CT ABDOMEN AND PELVIS WITHOUT AND WITH CONTRAST
2 of 9 series · 13 of 46 positions shown, 15 images · IV contrast (omnipaque)
Comparison: Ultrasound 12/31/2018

CLINICAL DATA: Followup abnormal ultrasound showing a liver lesion.

EXAM:
CT ABDOMEN AND PELVIS WITHOUT AND WITH CONTRAST
TECHNIQUE: Multidetector CT imaging of the abdomen and pelvis was performed
following the standard protocol before and following the bolus
administration of intravenous contrast.
CONTRAST:  100mL OMNIPAQUE IOHEXOL 300 MG/ML  SOLN

[Series 5: coronal arterial · coronal · arterial · 0.62mm/px · 3 of 107 slices shown]
[im 27/107  soft-tissue]
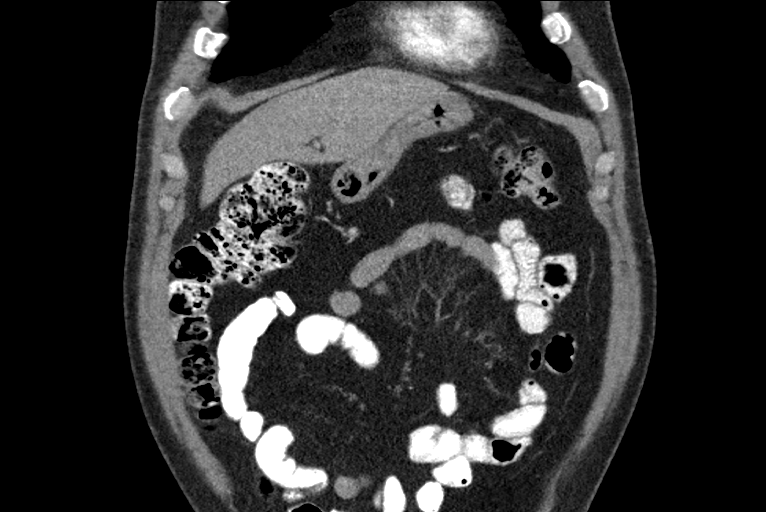
[im 54/107  soft-tissue]
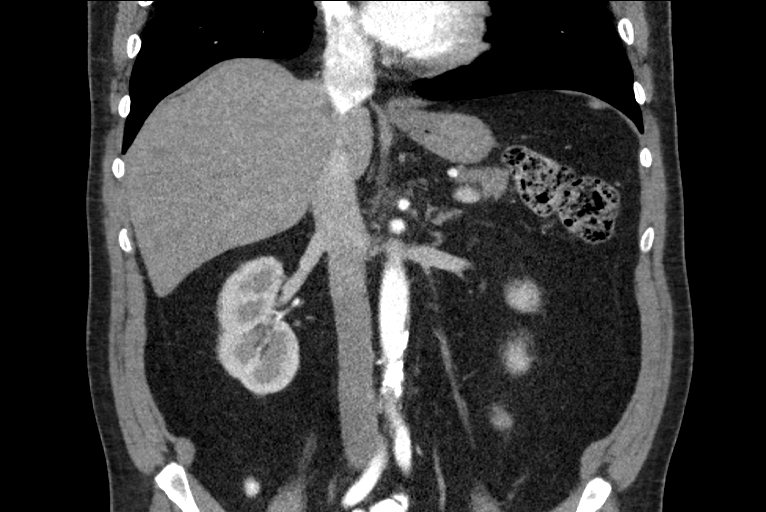
[im 80/107  soft-tissue]
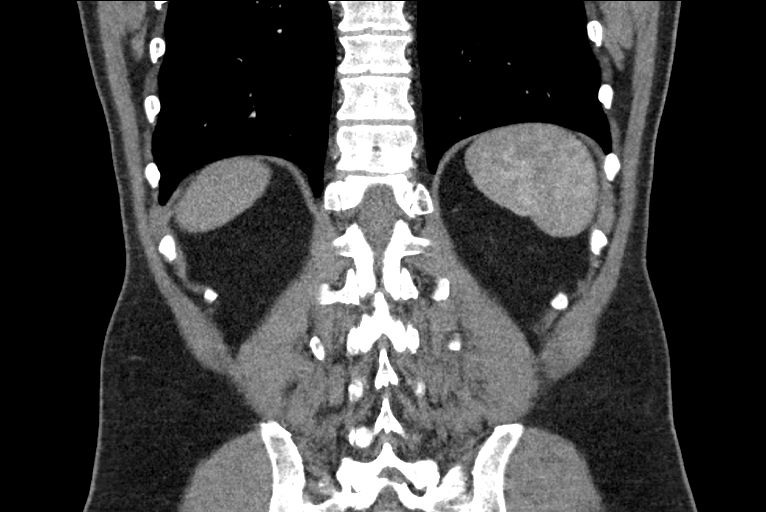

[Series 7: axial venous · axial · portal-venous · 0.89mm/px · z∈[-600,-174]mm · 10 of 174 slices shown, 12 images]
[im 16/174  soft-tissue]
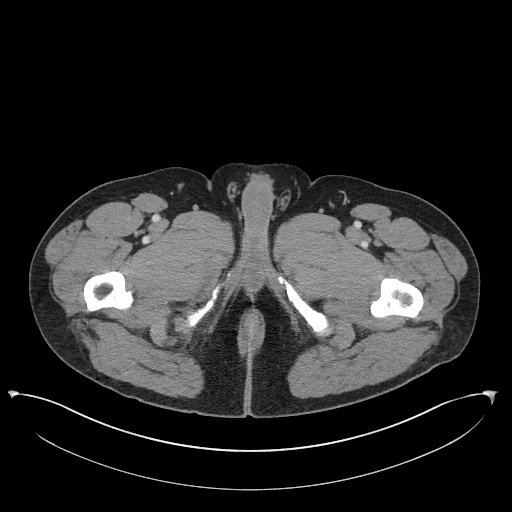
[im 16/174  bone]
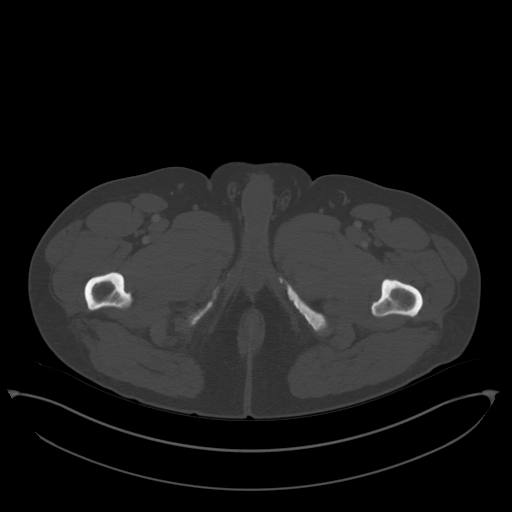
[im 32/174  soft-tissue]
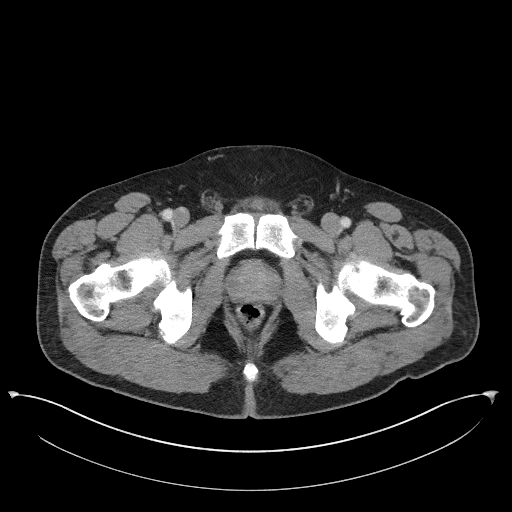
[im 48/174  soft-tissue]
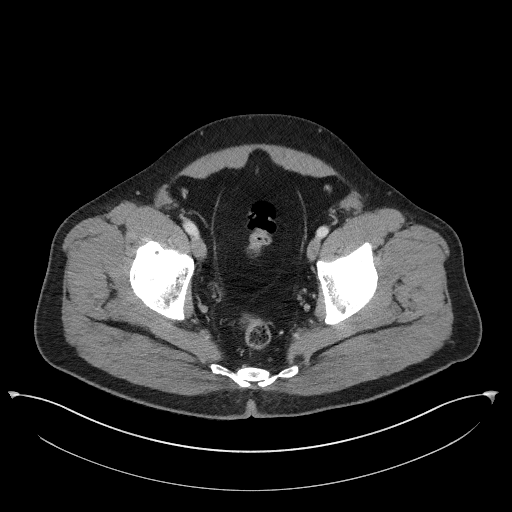
[im 63/174  soft-tissue]
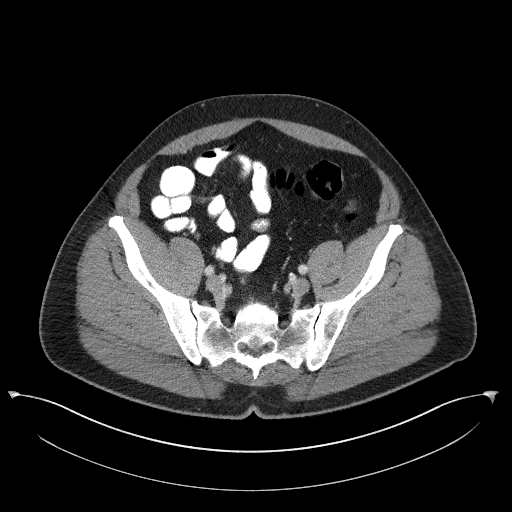
[im 79/174  soft-tissue]
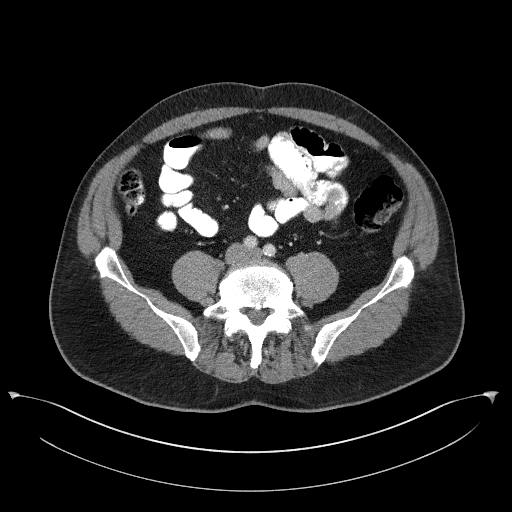
[im 95/174  soft-tissue]
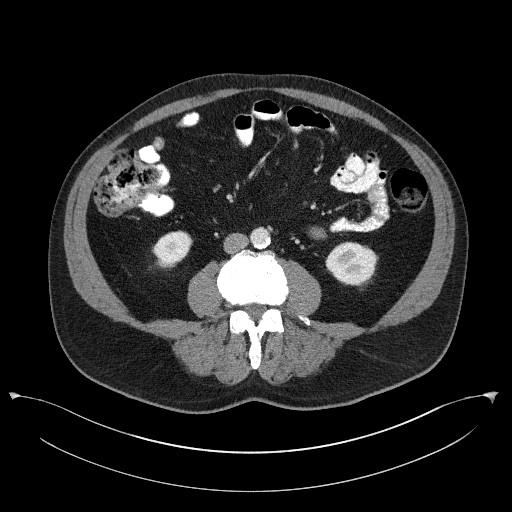
[im 111/174  soft-tissue]
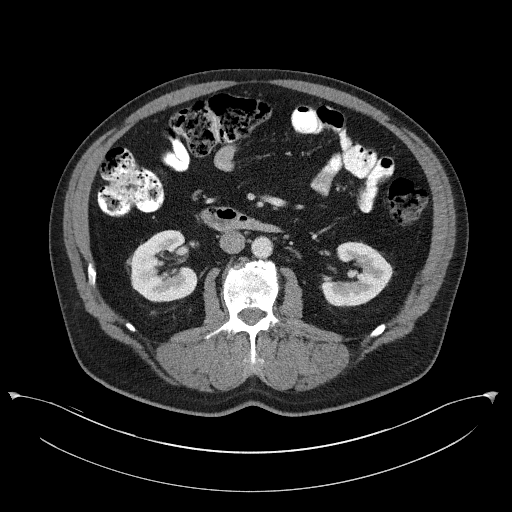
[im 126/174  soft-tissue]
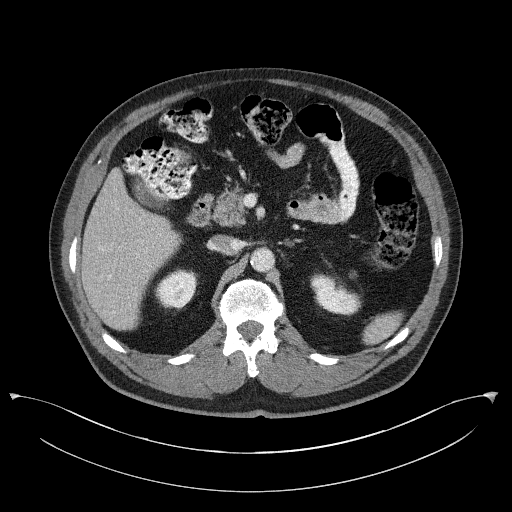
[im 142/174  soft-tissue]
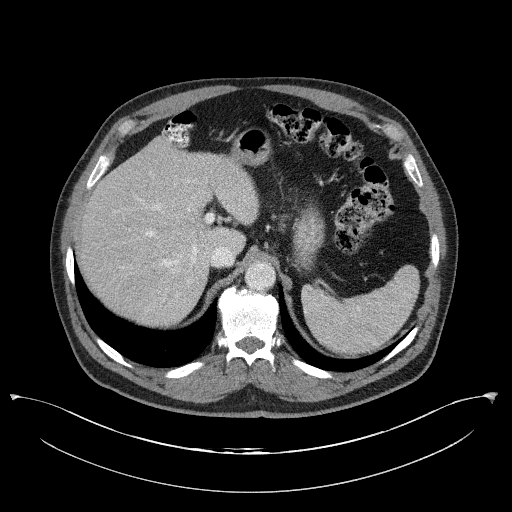
[im 142/174  bone]
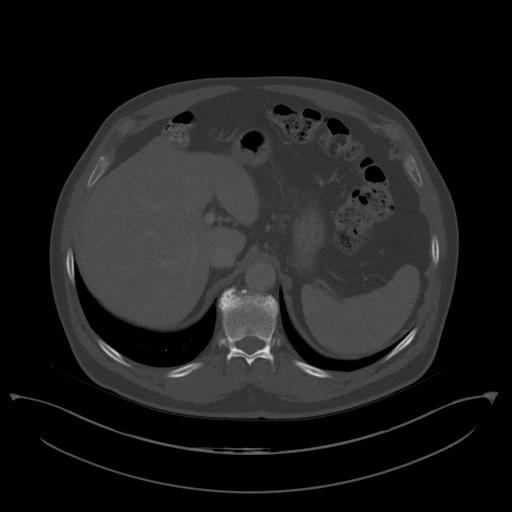
[im 158/174  soft-tissue]
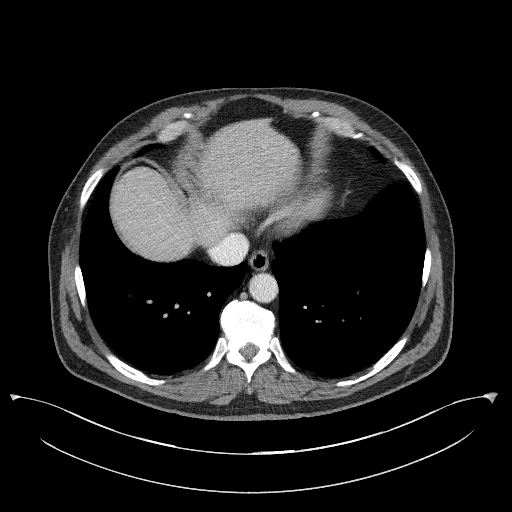

[13 of 46 positions shown; findings below may reference images not displayed]

FINDINGS: Lower chest: The lung bases are clear of acute process. No pleural
effusion or pulmonary lesions. The heart is normal in size. No
pericardial effusion. The distal esophagus and aorta are
unremarkable.

Hepatobiliary: No focal hepatic lesions or intrahepatic biliary
dilatation. The abnormality on the ultrasound may reflect some
subtle fatty infiltration or it may have been artifact. Gallbladder
is normal. No intra or extrahepatic biliary dilatation.

Pancreas: No mass or acute inflammation. There is a small cystic
area noted in the pancreatic tail. This may be related to patient's
prior pancreatitis. The main pancreatic duct appears normal in
caliber. I would recommend a follow-up MRI examination of the
abdomen without and with contrast in 4-6 months to reassess this.

Spleen: Normal size.  No focal lesions.

Adrenals/Urinary Tract: The adrenal glands and kidneys are
unremarkable. There is a simple appearing left renal cyst. The
bladder appears normal.

Stomach/Bowel: The stomach, duodenum, small bowel and colon are
grossly normal. No inflammatory changes, mass lesions or obstructive
findings. The terminal ileum and appendix are normal. Moderate stool
throughout the colon and down into the rectum may suggest
constipation.

Vascular/Lymphatic: Moderate atherosclerotic calcifications
involving the aorta iliac arteries but no aneurysm or dissection.
The branch vessels are patent. The major venous structures are
patent. No mesenteric or retroperitoneal mass or adenopathy.

Reproductive: The prostate gland and seminal vesicles are
unremarkable.

Other: A few scattered pelvic lymph nodes but no mass or adenopathy.
No free pelvic fluid collections. No inguinal mass or adenopathy.

Musculoskeletal: No significant bony findings. Moderate degenerative
changes involving the spine.
IMPRESSION: 1. No focal liver lesion is identified.
2. No acute abdominal/pelvic findings, mass lesions or adenopathy.
3. Moderate stool throughout the colon could suggest constipation.
4. Moderate age advanced vascular calcifications.

## 2020-07-20 ENCOUNTER — Ambulatory Visit: Payer: BC Managed Care – PPO | Admitting: Family Medicine

## 2020-07-21 ENCOUNTER — Other Ambulatory Visit: Payer: Self-pay | Admitting: Family Medicine

## 2020-07-21 DIAGNOSIS — I10 Essential (primary) hypertension: Secondary | ICD-10-CM

## 2020-08-19 ENCOUNTER — Ambulatory Visit: Payer: BC Managed Care – PPO | Admitting: Family Medicine

## 2020-08-23 ENCOUNTER — Other Ambulatory Visit: Payer: Self-pay

## 2020-08-23 ENCOUNTER — Ambulatory Visit (INDEPENDENT_AMBULATORY_CARE_PROVIDER_SITE_OTHER): Payer: BC Managed Care – PPO | Admitting: Ophthalmology

## 2020-08-23 ENCOUNTER — Encounter (INDEPENDENT_AMBULATORY_CARE_PROVIDER_SITE_OTHER): Payer: Self-pay | Admitting: Ophthalmology

## 2020-08-23 DIAGNOSIS — D3131 Benign neoplasm of right choroid: Secondary | ICD-10-CM

## 2020-08-23 NOTE — Progress Notes (Signed)
08/23/2020     CHIEF COMPLAINT Patient presents for Retina Follow Up (6 MO FU OU, B Scan OD///Pt reports stable vision OU. Pt denies any new F/F, pain, or pressure OU. )   HISTORY OF PRESENT ILLNESS: Eric Salas is a 65 y.o. male who presents to the clinic today for:   HPI    Retina Follow Up    Patient presents with  Other.  In right eye.  This started 6 months ago.  Duration of 6 months.  Since onset it is stable. Additional comments: 6 MO FU OU, B Scan OD   Pt reports stable vision OU. Pt denies any new F/F, pain, or pressure OU.        Last edited by Donne Hazel on 08/23/2020  8:18 AM. (History)      Referring physician: Libby Maw, MD [place],  [place] [id number]  HISTORICAL INFORMATION:   Selected notes from the MEDICAL RECORD NUMBER    Lab Results  Component Value Date   HGBA1C 6.2 07/04/2019     CURRENT MEDICATIONS: No current outpatient medications on file. (Ophthalmic Drugs)   No current facility-administered medications for this visit. (Ophthalmic Drugs)   Current Outpatient Medications (Other)  Medication Sig  . allopurinol (ZYLOPRIM) 100 MG tablet Take 1 tablet (100 mg total) by mouth daily. (Patient taking differently: Take 200 mg by mouth daily. )  . amLODipine (NORVASC) 5 MG tablet Take 1 tablet (5 mg total) by mouth daily.  . cimetidine (TAGAMET HB) 200 MG tablet Take 1 tablet (200 mg total) by mouth 2 (two) times daily for 30 days.  . colchicine 0.6 MG tablet Take 1 tablet (0.6 mg total) by mouth 2 (two) times daily. As needed for attacks.  . diclofenac sodium (VOLTAREN) 1 % GEL Apply 2 g topically 4 (four) times daily. As needed.  . indomethacin (INDOCIN) 50 MG capsule TAKE 1 CAPSULE BY MOUTH THREE TIMES DAILY WITH MEALS AS NEEDED FOR GOUT FLAIRS  . irbesartan (AVAPRO) 300 MG tablet Take 1 tablet by mouth once daily  . rosuvastatin (CRESTOR) 20 MG tablet TAKE 1 TABLET BY MOUTH EVERY OTHER DAY  .  traMADol (ULTRAM) 50 MG tablet Take 1 tablet (50 mg total) by mouth every 8 (eight) hours as needed.   No current facility-administered medications for this visit. (Other)      REVIEW OF SYSTEMS:    ALLERGIES Allergies  Allergen Reactions  . Codeine Itching    PAST MEDICAL HISTORY Past Medical History:  Diagnosis Date  . Arthritis   . Cataract    lt. eye beginning stage  . Hyperlipidemia   . Hypertension    Past Surgical History:  Procedure Laterality Date  . COLONOSCOPY      FAMILY HISTORY Family History  Problem Relation Age of Onset  . Cancer Mother   . Miscarriages / [place] Mother   . Alcohol abuse Father   . Diabetes Father   . Heart attack Father   . Heart disease Father   . High Cholesterol Father   . High blood pressure Father   . Stroke Father   . Diabetes Brother   . Hearing loss Brother   . High Cholesterol Brother   . Colon cancer Brother   . Cancer Brother   . Diabetes Brother   . Drug abuse Brother   . High Cholesterol Brother   . Stomach cancer Brother   . Rectal cancer Neg Hx   .  Esophageal cancer Neg Hx   . Pancreatic cancer Neg Hx   . Liver cancer Neg Hx   . Prostate cancer Neg Hx     SOCIAL HISTORY Social History   Tobacco Use  . Smoking status: Former Smoker    Types: Cigarettes    Quit date: 10/18/2005    Years since quitting: 14.8  . Smokeless tobacco: Never Used  Vaping Use  . Vaping Use: Never used  Substance Use Topics  . Alcohol use: Not Currently    Comment: quit 15 years ago  . Drug use: No         OPHTHALMIC EXAM:  Base Eye Exam    Visual Acuity (ETDRS)      Right Left   Dist [place] 20/20 -2 20/20       Tonometry (Tonopen, 8:22 AM)      Right Left   Pressure 14 17       Pupils      Pupils Dark Light Shape React APD   Right PERRL 4 3 Round Brisk None   Left PERRL 4 3 Round Brisk None       Visual Fields (Counting fingers)      Left Right    Full Full       Extraocular Movement      Right  Left    Full Full       Neuro/Psych    Oriented x3: Yes   Mood/Affect: Normal       Dilation    Right eye: 1.0% Mydriacyl, 2.5% Phenylephrine @ 8:22 AM        Slit Lamp and Fundus Exam    External Exam      Right Left   External Normal Normal       Slit Lamp Exam      Right Left   Lids/Lashes Normal Normal   Conjunctiva/Sclera White and quiet White and quiet   Cornea Clear Clear   Anterior Chamber Deep and quiet Deep and quiet   Iris Round and reactive Round and reactive   Lens 1+ Nuclear sclerosis 1+ Nuclear sclerosis   Anterior Vitreous Normal Normal       Fundus Exam      Right Left   Posterior Vitreous Normal    Disc Normal    C/D Ratio 0.25    Macula Normal    Vessels Normal    Periphery 2.2 x 2 dd flat nevus temporal to macula, with no high risk features.  No Subretinal fluid, no atrophy but no discernible thickness.           IMAGING AND PROCEDURES  Imaging and Procedures for 08/23/20  Color Fundus Photography Optos - OU - Both Eyes       Right Eye Progression has been stable. Disc findings include normal observations. Macula : normal observations. Vessels : normal observations.   Left Eye Progression has been stable. Disc findings include normal observations. Macula : normal observations. Vessels : normal observations.   Notes 1.  Choroidal nevus, small, temporal macula, flat with no change in color or pigmentation or size from March 2021.  Will continue to monitor OD.       B-Scan Ultrasound - OD - Right Eye       Quality was good. Findings included normal observations. Lesion size was unable to assess.   Notes Choroidal nevus in the posterior pole macula right eye, temporal portion of the macula, not imaged successfully to measure so there is no thickness  measurement ability  There is no subretinal fluid                ASSESSMENT/PLAN:  Choroidal nevus of right eye The nature of choroidal nevus was discussed with the  patient and an informational form was offered.  Photo documentation was discussed with the patient.  Periodic follow-up may be needed for a lifetime. The patient's questions were answered. At minimum, annual exams will be needed if no signs of early growth.      ICD-10-CM   1. Choroidal nevus of right eye  D31.31 Color Fundus Photography Optos - OU - Both Eyes    B-Scan Ultrasound - OD - Right Eye    1.  No interval change in choroidal nevus appearance right eye.  B-scan ultrasonography confirms the absence of thickness.  2.  3.  Ophthalmic Meds Ordered this visit:  No orders of the defined types were placed in this encounter.      Return in about 1 year (around 08/23/2021) for DILATE OU, COLOR FP.  Patient Instructions  Patient requested to contact the office promptly new onset visual acuity declines or distortion    Explained the diagnoses, plan, and follow up with the patient and they expressed understanding.  Patient expressed understanding of the importance of proper follow up care.   Clent Demark Caroly Purewal M.D. Diseases & Surgery of the Retina and Vitreous [facility] [place] 08/23/20     Abbreviations: M myopia (nearsighted); A astigmatism; H hyperopia (farsighted); P presbyopia; Mrx spectacle prescription;  CTL contact lenses; OD right eye; OS left eye; OU both eyes  XT exotropia; ET esotropia; PEK punctate epithelial keratitis; PEE punctate epithelial erosions; DES dry eye syndrome; MGD meibomian gland dysfunction; ATs artificial tears; PFAT's preservative free artificial tears; [place] nuclear sclerotic cataract; PSC posterior subcapsular cataract; ERM epi-retinal membrane; PVD posterior vitreous detachment; RD retinal detachment; DM diabetes mellitus; DR diabetic retinopathy; NPDR non-proliferative diabetic retinopathy; PDR proliferative diabetic retinopathy; CSME clinically significant macular edema; DME diabetic macular edema; dbh dot blot hemorrhages; CWS cotton wool  spot; POAG primary open angle glaucoma; C/D cup-to-disc ratio; HVF humphrey visual field; GVF goldmann visual field; OCT optical coherence tomography; IOP intraocular pressure; BRVO Branch retinal vein occlusion; CRVO central retinal vein occlusion; CRAO central retinal artery occlusion; BRAO branch retinal artery occlusion; RT retinal tear; SB scleral buckle; PPV pars plana vitrectomy; VH Vitreous hemorrhage; PRP panretinal laser photocoagulation; IVK intravitreal kenalog; VMT vitreomacular traction; MH Macular hole;  NVD neovascularization of the disc; NVE neovascularization elsewhere; AREDS age related eye disease study; ARMD age related macular degeneration; POAG primary open angle glaucoma; EBMD epithelial/anterior basement membrane dystrophy; ACIOL anterior chamber intraocular lens; IOL intraocular lens; PCIOL posterior chamber intraocular lens; Phaco/IOL phacoemulsification with intraocular lens placement; [place] photorefractive keratectomy; LASIK laser assisted in situ keratomileusis; HTN hypertension; DM diabetes mellitus; COPD chronic obstructive pulmonary disease

## 2020-08-23 NOTE — Assessment & Plan Note (Signed)
The nature of choroidal nevus was discussed with the patient and an informational form was offered.  Photo documentation was discussed with the patient.  Periodic follow-up may be needed for a lifetime. The patient's questions were answered. At minimum, annual exams will be needed if no signs of early growth. 

## 2020-08-23 NOTE — Patient Instructions (Signed)
Patient requested to contact the office promptly new onset visual acuity declines or distortion

## 2020-09-24 ENCOUNTER — Other Ambulatory Visit: Payer: Self-pay | Admitting: Family Medicine

## 2020-09-24 DIAGNOSIS — I10 Essential (primary) hypertension: Secondary | ICD-10-CM

## 2020-09-24 DIAGNOSIS — Z713 Dietary counseling and surveillance: Secondary | ICD-10-CM | POA: Diagnosis not present

## 2020-10-01 ENCOUNTER — Other Ambulatory Visit: Payer: Self-pay

## 2020-10-04 ENCOUNTER — Ambulatory Visit (INDEPENDENT_AMBULATORY_CARE_PROVIDER_SITE_OTHER): Payer: BC Managed Care – PPO | Admitting: Family Medicine

## 2020-10-04 ENCOUNTER — Other Ambulatory Visit: Payer: Self-pay

## 2020-10-04 ENCOUNTER — Encounter: Payer: Self-pay | Admitting: Family Medicine

## 2020-10-04 VITALS — BP 140/82 | HR 57 | Temp 97.7°F | Ht 70.0 in | Wt 227.0 lb

## 2020-10-04 DIAGNOSIS — R7309 Other abnormal glucose: Secondary | ICD-10-CM | POA: Diagnosis not present

## 2020-10-04 DIAGNOSIS — Z8719 Personal history of other diseases of the digestive system: Secondary | ICD-10-CM | POA: Diagnosis not present

## 2020-10-04 DIAGNOSIS — I10 Essential (primary) hypertension: Secondary | ICD-10-CM | POA: Diagnosis not present

## 2020-10-04 DIAGNOSIS — M109 Gout, unspecified: Secondary | ICD-10-CM | POA: Diagnosis not present

## 2020-10-04 DIAGNOSIS — Z0000 Encounter for general adult medical examination without abnormal findings: Secondary | ICD-10-CM

## 2020-10-04 DIAGNOSIS — E7800 Pure hypercholesterolemia, unspecified: Secondary | ICD-10-CM | POA: Diagnosis not present

## 2020-10-04 LAB — CBC
HCT: 41.8 % (ref 39.0–52.0)
Hemoglobin: 14.2 g/dL (ref 13.0–17.0)
MCHC: 34 g/dL (ref 30.0–36.0)
MCV: 91.3 fl (ref 78.0–100.0)
Platelets: 139 10*3/uL — ABNORMAL LOW (ref 150.0–400.0)
RBC: 4.57 Mil/uL (ref 4.22–5.81)
RDW: 13.2 % (ref 11.5–15.5)
WBC: 4.2 10*3/uL (ref 4.0–10.5)

## 2020-10-04 LAB — COMPREHENSIVE METABOLIC PANEL
ALT: 26 U/L (ref 0–53)
AST: 26 U/L (ref 0–37)
Albumin: 4.4 g/dL (ref 3.5–5.2)
Alkaline Phosphatase: 59 U/L (ref 39–117)
BUN: 16 mg/dL (ref 6–23)
CO2: 29 mEq/L (ref 19–32)
Calcium: 9.1 mg/dL (ref 8.4–10.5)
Chloride: 104 mEq/L (ref 96–112)
Creatinine, Ser: 1.01 mg/dL (ref 0.40–1.50)
GFR: 78.96 mL/min (ref >60.00)
Glucose, Bld: 146 mg/dL — ABNORMAL HIGH (ref 70–99)
Potassium: 4.5 mEq/L (ref 3.5–5.1)
Sodium: 140 mEq/L (ref 135–145)
Total Bilirubin: 0.5 mg/dL (ref 0.2–1.2)
Total Protein: 6.4 g/dL (ref 6.0–8.3)

## 2020-10-04 LAB — URINALYSIS, ROUTINE W REFLEX MICROSCOPIC
Bilirubin Urine: NEGATIVE
Hgb urine dipstick: NEGATIVE
Ketones, ur: NEGATIVE
Leukocytes,Ua: NEGATIVE
Nitrite: NEGATIVE
RBC / HPF: NONE SEEN (ref 0–?)
Specific Gravity, Urine: 1.025 (ref 1.000–1.030)
Total Protein, Urine: NEGATIVE
Urine Glucose: NEGATIVE
Urobilinogen, UA: 0.2 (ref 0.0–1.0)
WBC, UA: NONE SEEN (ref 0–?)
pH: 6 (ref 5.0–8.0)

## 2020-10-04 LAB — PSA: PSA: 1.93 ng/mL (ref 0.10–4.00)

## 2020-10-04 LAB — LIPID PANEL
Cholesterol: 148 mg/dL (ref 0–200)
HDL: 40.7 mg/dL (ref >39.00)
LDL Cholesterol: 89 mg/dL (ref 0–99)
NonHDL: 107.68
Total CHOL/HDL Ratio: 4
Triglycerides: 94 mg/dL (ref 0.0–149.0)
VLDL: 18.8 mg/dL (ref 0.0–40.0)

## 2020-10-04 LAB — URIC ACID: Uric Acid, Serum: 6 mg/dL (ref 4.0–7.8)

## 2020-10-04 LAB — HEMOGLOBIN A1C: Hgb A1c MFr Bld: 6.2 % (ref 4.6–6.5)

## 2020-10-04 MED ORDER — AMLODIPINE BESYLATE 10 MG PO TABS: 10.0000 mg | ORAL_TABLET | Freq: Every day | ORAL | 3 refills | Status: DC

## 2020-10-04 NOTE — Progress Notes (Signed)
Established Patient Office Visit  Subjective:  Patient ID: Eric Salas, male    DOB: 1955-08-21  Age: 65 y.o. MRN: [id number]  CC:  Chief Complaint  Patient presents with  . Annual Exam    CPE, no concerns. Patent fasting for labs.     HPI Eric Salas presents for follow-up of hypertension, elevated cholesterol, elevated glucose.  Remains active on his job walking several thousand steps daily but continues to gain some weight.  His wife is [employer].  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  Tolerating all of his medications.  Up to 300 mg of allopurinol daily having some problems with arthritis in his hands.  Past Medical History:  Diagnosis Date  . Arthritis   . Cataract    lt. eye beginning stage  . Hyperlipidemia   . Hypertension     Past Surgical History:  Procedure Laterality Date  . COLONOSCOPY      Family History  Problem Relation Age of Onset  . Cancer Mother   . Miscarriages / [place] Mother   . Alcohol abuse Father   . Diabetes Father   . Heart attack Father   . Heart disease Father   . High Cholesterol Father   . High blood pressure Father   . Stroke Father   . Diabetes Brother   . Hearing loss Brother   . High Cholesterol Brother   . Colon cancer Brother   . Cancer Brother   . Diabetes Brother   . Drug abuse Brother   . High Cholesterol Brother   . Stomach cancer Brother   . Rectal cancer Neg Hx   . Esophageal cancer Neg Hx   . Pancreatic cancer Neg Hx   . Liver cancer Neg Hx   . Prostate cancer Neg Hx     Social History   Socioeconomic History  . Marital status: Married    Spouse name: Not on file  . Number of children: Not on file  . Years of education: Not on file  . Highest education level: Not on file  Occupational History  . Not on file  Tobacco Use  . Smoking status: Former Smoker    Types: Cigarettes    Quit date: 10/18/2005    Years since quitting: 14.9  . Smokeless  tobacco: Never Used  Vaping Use  . Vaping Use: Never used  Substance and Sexual Activity  . Alcohol use: Not Currently    Comment: quit 15 years ago  . Drug use: No  . Sexual activity: Yes    Partners: Female  Other Topics Concern  . Not on file  Social History Narrative  . Not on file   Social Determinants of Health   Financial Resource Strain: Not on file  Food Insecurity: Not on file  Transportation Needs: Not on file  Physical Activity: Not on file  Stress: Not on file  Social Connections: Not on file  Intimate Partner Violence: Not on file    Outpatient Medications Prior to Visit  Medication Sig Dispense Refill  . allopurinol (ZYLOPRIM) 100 MG tablet Take 1 tablet (100 mg total) by mouth daily. (Patient taking differently: Take 200 mg by mouth daily.) 90 tablet 1  . colchicine 0.6 MG tablet Take 1 tablet (0.6 mg total) by mouth 2 (two) times daily. As needed for attacks. 180 tablet 0  . diclofenac sodium (VOLTAREN) 1 % GEL Apply 2 g topically 4 (four) times daily. As needed.  100 g 1  . indomethacin (INDOCIN) 50 MG capsule TAKE 1 CAPSULE BY MOUTH THREE TIMES DAILY WITH MEALS AS NEEDED FOR GOUT FLAIRS 120 capsule 0  . irbesartan (AVAPRO) 300 MG tablet Take 1 tablet by mouth once daily 90 tablet 0  . rosuvastatin (CRESTOR) 20 MG tablet TAKE 1 TABLET BY MOUTH EVERY OTHER DAY 90 tablet 0  . amLODipine (NORVASC) 5 MG tablet Take 1 tablet by mouth once daily 90 tablet 0  . cimetidine (TAGAMET HB) 200 MG tablet Take 1 tablet (200 mg total) by mouth 2 (two) times daily for 30 days. 60 tablet 0  . traMADol (ULTRAM) 50 MG tablet Take 1 tablet (50 mg total) by mouth every 8 (eight) hours as needed. (Patient not taking: Reported on 10/04/2020) 30 tablet 0   No facility-administered medications prior to visit.    Allergies  Allergen Reactions  . Codeine Itching    ROS Review of Systems  Constitutional: Negative.   HENT: Negative.   Eyes: Negative for photophobia and visual  disturbance.  Respiratory: Negative.   Cardiovascular: Negative.   Gastrointestinal: Negative.   Endocrine: Negative for polyphagia and polyuria.  Genitourinary: Negative for difficulty urinating, frequency and urgency.  Skin: Negative.   Neurological: Negative for speech difficulty and weakness.  Hematological: Does not bruise/bleed easily.  Psychiatric/Behavioral: Negative.       Objective:    Physical Exam Vitals and nursing note reviewed.  Constitutional:      General: He is not in acute distress.    Appearance: Normal appearance. He is not ill-appearing, toxic-appearing or diaphoretic.  HENT:     Head: Normocephalic and atraumatic.     Right Ear: Tympanic membrane, ear canal and external ear normal.     Left Ear: Tympanic membrane, ear canal and external ear normal.     Mouth/Throat:     Mouth: Mucous membranes are moist.     Pharynx: Oropharynx is clear. No oropharyngeal exudate or posterior oropharyngeal erythema.  Eyes:     General: No scleral icterus.    Extraocular Movements: Extraocular movements intact.     Conjunctiva/sclera: Conjunctivae normal.     Pupils: Pupils are equal, round, and reactive to light.  Neck:     Vascular: No carotid bruit.  Cardiovascular:     Rate and Rhythm: Normal rate and regular rhythm.  Pulmonary:     Effort: Pulmonary effort is normal.     Breath sounds: Normal breath sounds.  Abdominal:     General: Bowel sounds are normal. There is no distension.     Palpations: There is no mass.     Tenderness: There is no abdominal tenderness. There is no guarding or rebound.     Hernia: No hernia is present.  Musculoskeletal:     Cervical back: Neck supple. No rigidity or tenderness.     Right lower leg: No edema.     Left lower leg: No edema.  Lymphadenopathy:     Cervical: No cervical adenopathy.  Skin:    General: Skin is warm and dry.  Neurological:     General: No focal deficit present.     Mental Status: He is alert and oriented  to person, place, and time.  Psychiatric:        Mood and Affect: Mood normal.        Behavior: Behavior normal.     BP 140/82   Pulse (!) 57   Temp 97.7 F (36.5 C) (Temporal)   Ht 5\' 10"  (1.778  m)   Wt 227 lb (103 kg)   SpO2 98%   BMI 32.57 kg/m  Wt Readings from Last 3 Encounters:  10/04/20 227 lb (103 kg)  10/03/19 228 lb 12.8 oz (103.8 kg)  07/04/19 223 lb 12.8 oz (101.5 kg)     There are no preventive care reminders to display for this patient.  There are no preventive care reminders to display for this patient.  Lab Results  Component Value Date   TSH 1.08 06/11/2017   Lab Results  Component Value Date   WBC 4.6 10/03/2019   HGB 14.5 10/03/2019   HCT 43.2 10/03/2019   MCV 91.6 10/03/2019   PLT 143.0 (L) 10/03/2019   Lab Results  Component Value Date   NA 139 10/03/2019   K 4.6 10/03/2019   CO2 27 10/03/2019   GLUCOSE 136 (H) 10/03/2019   BUN 16 10/03/2019   CREATININE 0.96 10/03/2019   BILITOT 0.5 10/03/2019   ALKPHOS 57 10/03/2019   AST 20 10/03/2019   ALT 29 10/03/2019   PROT 6.5 10/03/2019   ALBUMIN 4.1 10/03/2019   CALCIUM 9.1 10/03/2019   ANIONGAP 10 01/03/2019   GFR 79.15 10/03/2019   Lab Results  Component Value Date   CHOL 155 10/03/2019   Lab Results  Component Value Date   HDL 40.50 10/03/2019   Lab Results  Component Value Date   LDLCALC 88 10/03/2019   Lab Results  Component Value Date   TRIG 131.0 10/03/2019   Lab Results  Component Value Date   CHOLHDL 4 10/03/2019   Lab Results  Component Value Date   HGBA1C 6.2 07/04/2019      Assessment & Plan:   Problem List Items Addressed This Visit      Cardiovascular and Mediastinum   Essential hypertension   Relevant Medications   amLODipine (NORVASC) 10 MG tablet     Other   Elevated LDL cholesterol level   Relevant Orders   Comprehensive metabolic panel   Lipid panel   Gout - Primary   Relevant Orders   Uric acid   Healthcare maintenance   Relevant  Orders   PSA   Elevated glucose   Relevant Orders   Hemoglobin A1c   Urinalysis, Routine w reflex microscopic   History of pancreatitis   Relevant Orders   Comprehensive metabolic panel    Other Visit Diagnoses    Hypertension goal BP (blood pressure) < 140/80       Relevant Medications   amLODipine (NORVASC) 10 MG tablet   Other Relevant Orders   CBC   Comprehensive metabolic panel      Meds ordered this encounter  Medications  . amLODipine (NORVASC) 10 MG tablet    Sig: Take 1 tablet (10 mg total) by mouth daily.    Dispense:  90 tablet    Refill:  3    Follow-up: Return in about 6 months (around 04/06/2021), or Have increased amlodipine to 10 mg each day.  Try to lose some weight., for May need to add medicine for diabetes.. Given information on health maintenance and disease prevention as well as calorie counting to lose weight.   Libby Maw, MD

## 2020-10-04 NOTE — Patient Instructions (Signed)
Health Maintenance, Male Adopting a healthy lifestyle and getting preventive care are important in promoting health and wellness. Ask your health care provider about:  The right schedule for you to have regular tests and exams.  Things you can do on your own to prevent diseases and keep yourself healthy. What should I know about diet, weight, and exercise? Eat a healthy diet  Eat a diet that includes plenty of vegetables, fruits, low-fat dairy products, and lean protein.  Do not eat a lot of foods that are high in solid fats, added sugars, or sodium.   Maintain a healthy weight Body mass index (BMI) is a measurement that can be used to identify possible weight problems. It estimates body fat based on height and weight. Your health care provider can help determine your BMI and help you achieve or maintain a healthy weight. Get regular exercise Get regular exercise. This is one of the most important things you can do for your health. Most adults should:  Exercise for at least 150 minutes each week. The exercise should increase your heart rate and make you sweat (moderate-intensity exercise).  Do strengthening exercises at least twice a week. This is in addition to the moderate-intensity exercise.  Spend less time sitting. Even light physical activity can be beneficial. Watch cholesterol and blood lipids Have your blood tested for lipids and cholesterol at 64 years of age, then have this test every 5 years. You may need to have your cholesterol levels checked more often if:  Your lipid or cholesterol levels are high.  You are older than 64 years of age.  You are at high risk for heart disease. What should I know about cancer screening? Many types of cancers can be detected early and may often be prevented. Depending on your health history and family history, you may need to have cancer screening at various ages. This may include screening for:  Colorectal cancer.  Prostate  cancer.  Skin cancer.  Lung cancer. What should I know about heart disease, diabetes, and high blood pressure? Blood pressure and heart disease  High blood pressure causes heart disease and increases the risk of stroke. This is more likely to develop in people who have high blood pressure readings, are of African descent, or are overweight.  Talk with your health care provider about your target blood pressure readings.  Have your blood pressure checked: ? Every 3-5 years if you are 18-39 years of age. ? Every year if you are 40 years old or older.  If you are between the ages of 65 and 75 and are a current or former smoker, ask your health care provider if you should have a one-time screening for abdominal aortic aneurysm (AAA). Diabetes Have regular diabetes screenings. This checks your fasting blood sugar level. Have the screening done:  Once every three years after age 45 if you are at a normal weight and have a low risk for diabetes.  More often and at a younger age if you are overweight or have a high risk for diabetes. What should I know about preventing infection? Hepatitis B If you have a higher risk for hepatitis B, you should be screened for this virus. Talk with your health care provider to find out if you are at risk for hepatitis B infection. Hepatitis C Blood testing is recommended for:  Everyone born from 1945 through 1965.  Anyone with known risk factors for hepatitis C. Sexually transmitted infections (STIs)  You should be screened each   year for STIs, including gonorrhea and chlamydia, if: ? You are sexually active and are younger than 64 years of age. ? You are older than 64 years of age and your health care provider tells you that you are at risk for this type of infection. ? Your sexual activity has changed since you were last screened, and you are at increased risk for chlamydia or gonorrhea. Ask your health care provider if you are at risk.  Ask your  health care provider about whether you are at high risk for HIV. Your health care provider may recommend a prescription medicine to help prevent HIV infection. If you choose to take medicine to prevent HIV, you should first get tested for HIV. You should then be tested every 3 months for as long as you are taking the medicine. Follow these instructions at home: Lifestyle  Do not use any products that contain nicotine or tobacco, such as cigarettes, e-cigarettes, and chewing tobacco. If you need help quitting, ask your health care provider.  Do not use street drugs.  Do not share needles.  Ask your health care provider for help if you need support or information about quitting drugs. Alcohol use  Do not drink alcohol if your health care provider tells you not to drink.  If you drink alcohol: ? Limit how much you have to 0-2 drinks a day. ? Be aware of how much alcohol is in your drink. In the [place] one drink equals one 12 oz bottle of beer (355 mL), one 5 oz glass of wine (148 mL), or one 1 oz glass of hard liquor (44 mL). General instructions  Schedule regular health, dental, and eye exams.  Stay current with your vaccines.  Tell your health care provider if: ? You often feel depressed. ? You have ever been abused or do not feel safe at home. Summary  Adopting a healthy lifestyle and getting preventive care are important in promoting health and wellness.  Follow your health care provider's instructions about healthy diet, exercising, and getting tested or screened for diseases.  Follow your health care provider's instructions on monitoring your cholesterol and blood pressure. This information is not intended to replace advice given to you by your health care provider. Make sure you discuss any questions you have with your health care provider. Document Revised: 06/26/2018 Document Reviewed: 06/26/2018 Elsevier Patient Education  2021 [place] Years  Old, Male Preventive care refers to lifestyle choices and visits with your health care provider that can promote health and wellness. This includes:  A yearly physical exam. This is also called an annual wellness visit.  Regular dental and eye exams.  Immunizations.  Screening for certain conditions.  Healthy lifestyle choices, such as: ? Eating a healthy diet. ? Getting regular exercise. ? Not using drugs or products that contain nicotine and tobacco. ? Limiting alcohol use. What can I expect for my preventive care visit? Physical exam Your health care provider will check your:  Height and weight. These may be used to calculate your BMI (body mass index). BMI is a measurement that tells if you are at a healthy weight.  Heart rate and blood pressure.  Body temperature.  Skin for abnormal spots. Counseling Your health care provider may ask you questions about your:  Past medical problems.  Family's medical history.  Alcohol, tobacco, and drug use.  Emotional well-being.  Home life and relationship well-being.  Sexual activity.  Diet, exercise, and sleep habits.  Work and work Statistician.  Access to firearms. What immunizations do I need? Vaccines are usually given at various ages, according to a schedule. Your health care provider will recommend vaccines for you based on your age, medical history, and lifestyle or other factors, such as travel or where you work.   What tests do I need? Blood tests  Lipid and cholesterol levels. These may be checked every 5 years, or more often if you are over 44 years old.  Hepatitis C test.  Hepatitis B test. Screening  Lung cancer screening. You may have this screening every year starting at age 77 if you have a 30-pack-year history of smoking and currently smoke or have quit within the past 15 years.  Prostate cancer screening. Recommendations will vary depending on your family history and other risks.  Genital exam  to check for testicular cancer or hernias.  Colorectal cancer screening. ? All adults should have this screening starting at age 36 and continuing until age 28. ? Your health care provider may recommend screening at age 16 if you are at increased risk. ? You will have tests every 1-10 years, depending on your results and the type of screening test.  Diabetes screening. ? This is done by checking your blood sugar (glucose) after you have not eaten for a while (fasting). ? You may have this done every 1-3 years.  STD (sexually transmitted disease) testing, if you are at risk. Follow these instructions at home: Eating and drinking  Eat a diet that includes fresh fruits and vegetables, whole grains, lean protein, and low-fat dairy products.  Take vitamin and mineral supplements as recommended by your health care provider.  Do not drink alcohol if your health care provider tells you not to drink.  If you drink alcohol: ? Limit how much you have to 0-2 drinks a day. ? Be aware of how much alcohol is in your drink. In the [place] one drink equals one 12 oz bottle of beer (355 mL), one 5 oz glass of wine (148 mL), or one 1 oz glass of hard liquor (44 mL).   Lifestyle  Take daily care of your teeth and gums. Brush your teeth every morning and night with fluoride toothpaste. Floss one time each day.  Stay active. Exercise for at least 30 minutes 5 or more days each week.  Do not use any products that contain nicotine or tobacco, such as cigarettes, e-cigarettes, and chewing tobacco. If you need help quitting, ask your health care provider.  Do not use drugs.  If you are sexually active, practice safe sex. Use a condom or other form of protection to prevent STIs (sexually transmitted infections).  If told by your health care provider, take low-dose aspirin daily starting at age 10.  Find healthy ways to cope with stress, such as: ? Meditation, yoga, or listening to  music. ? Journaling. ? Talking to a trusted person. ? Spending time with friends and family. Safety  Always wear your seat belt while driving or riding in a vehicle.  Do not drive: ? If you have been drinking alcohol. Do not ride with someone who has been drinking. ? When you are tired or distracted. ? While texting.  Wear a helmet and other protective equipment during sports activities.  If you have firearms in your house, make sure you follow all gun safety procedures. What's next?  Go to your health care provider once a year for an annual wellness visit.  Ask your health  care provider how often you should have your eyes and teeth checked.  Stay up to date on all vaccines. This information is not intended to replace advice given to you by your health care provider. Make sure you discuss any questions you have with your health care provider. Document Revised: 04/01/2019 Document Reviewed: 06/27/2018 Elsevier Patient Education  2021 [place] [employer] Calories are units of energy. Your body needs a certain number of calories from food to keep going throughout the day. When you eat or drink more calories than your body needs, your body stores the extra calories mostly as fat. When you eat or drink fewer calories than your body needs, your body burns fat to get the energy it needs. Calorie counting means keeping track of how many calories you eat and drink each day. Calorie counting can be helpful if you need to lose weight. If you eat fewer calories than your body needs, you should lose weight. Ask your health care provider what a healthy weight is for you. For calorie counting to work, you will need to eat the right number of calories each day to lose a healthy amount of weight per week. A dietitian can help you figure out how many calories you need in a day and will suggest ways to reach your calorie goal.  A healthy amount of weight to lose each week is  usually 1-2 lb (0.5-0.9 kg). This usually means that your daily calorie intake should be reduced by 500-750 calories.  Eating 1,200-1,500 calories a day can help most women lose weight.  Eating 1,500-1,800 calories a day can help most men lose weight. What do I need to know about calorie counting? Work with your health care provider or dietitian to determine how many calories you should get each day. To meet your daily calorie goal, you will need to:  Find out how many calories are in each food that you would like to eat. Try to do this before you eat.  Decide how much of the food you plan to eat.  Keep a food log. Do this by writing down what you ate and how many calories it had. To successfully lose weight, it is important to balance calorie counting with a healthy lifestyle that includes regular activity. Where do I find calorie information? The number of calories in a food can be found on a Nutrition Facts label. If a food does not have a Nutrition Facts label, try to look up the calories online or ask your dietitian for help. Remember that calories are listed per serving. If you choose to have more than one serving of a food, you will have to multiply the calories per serving by the number of servings you plan to eat. For example, the label on a package of bread might say that a serving size is 1 slice and that there are 90 calories in a serving. If you eat 1 slice, you will have eaten 90 calories. If you eat 2 slices, you will have eaten 180 calories.   How do I keep a food log? After each time that you eat, record the following in your food log as soon as possible:  What you ate. Be sure to include toppings, sauces, and other extras on the food.  How much you ate. This can be measured in cups, ounces, or number of items.  How many calories were in each food and drink.  The total number of calories in  the food you ate. Keep your food log near you, such as in a pocket-sized notebook  or on an app or website on your mobile phone. Some programs will calculate calories for you and show you how many calories you have left to meet your daily goal. What are some portion-control tips?  Know how many calories are in a serving. This will help you know how many servings you can have of a certain food.  Use a measuring cup to measure serving sizes. You could also try weighing out portions on a kitchen scale. With time, you will be able to estimate serving sizes for some foods.  Take time to put servings of different foods on your favorite plates or in your favorite bowls and cups so you know what a serving looks like.  Try not to eat straight from a food's packaging, such as from a bag or box. Eating straight from the package makes it hard to see how much you are eating and can lead to overeating. Put the amount you would like to eat in a cup or on a plate to make sure you are eating the right portion.  Use smaller plates, glasses, and bowls for smaller portions and to prevent overeating.  Try not to multitask. For example, avoid watching TV or using your computer while eating. If it is time to eat, sit down at a table and enjoy your food. This will help you recognize when you are full. It will also help you be more mindful of what and how much you are eating. What are tips for following this plan? Reading food labels  Check the calorie count compared with the serving size. The serving size may be smaller than what you are used to eating.  Check the source of the calories. Try to choose foods that are high in protein, fiber, and vitamins, and low in saturated fat, trans fat, and sodium. Shopping  Read nutrition labels while you shop. This will help you make healthy decisions about which foods to buy.  Pay attention to nutrition labels for low-fat or fat-free foods. These foods sometimes have the same number of calories or more calories than the full-fat versions. They also often  have added sugar, starch, or salt to make up for flavor that was removed with the fat.  Make a grocery list of lower-calorie foods and stick to it. Cooking  Try to cook your favorite foods in a healthier way. For example, try baking instead of frying.  Use low-fat dairy products. Meal planning  Use more fruits and vegetables. One-half of your plate should be fruits and vegetables.  Include lean proteins, such as chicken, [place], and fish. Lifestyle Each week, aim to do one of the following:  150 minutes of moderate exercise, such as walking.  75 minutes of vigorous exercise, such as running. General information  Know how many calories are in the foods you eat most often. This will help you calculate calorie counts faster.  Find a way of tracking calories that works for you. Get creative. Try different apps or programs if writing down calories does not work for you. What foods should I eat?  Eat nutritious foods. It is better to have a nutritious, high-calorie food, such as an avocado, than a food with few nutrients, such as a bag of potato chips.  Use your calories on foods and drinks that will fill you up and will not leave you hungry soon after eating. ? Examples of foods  that fill you up are nuts and nut butters, vegetables, lean proteins, and high-fiber foods such as whole grains. High-fiber foods are foods with more than 5 g of fiber per serving.  Pay attention to calories in drinks. Low-calorie drinks include water and unsweetened drinks. The items listed above may not be a complete list of foods and beverages you can eat. Contact a dietitian for more information.   What foods should I limit? Limit foods or drinks that are not good sources of vitamins, minerals, or protein or that are high in unhealthy fats. These include:  Candy.  Other sweets.  Sodas, specialty coffee drinks, alcohol, and juice. The items listed above may not be a complete list of foods and beverages  you should avoid. Contact a dietitian for more information. How do I count calories when eating out?  Pay attention to portions. Often, portions are much larger when eating out. Try these tips to keep portions smaller: ? Consider sharing a meal instead of getting your own. ? If you get your own meal, eat only half of it. Before you start eating, ask for a container and put half of your meal into it. ? When available, consider ordering smaller portions from the menu instead of full portions.  Pay attention to your food and drink choices. Knowing the way food is cooked and what is included with the meal can help you eat fewer calories. ? If calories are listed on the menu, choose the lower-calorie options. ? Choose dishes that include vegetables, fruits, whole grains, low-fat dairy products, and lean proteins. ? Choose items that are boiled, broiled, grilled, or steamed. Avoid items that are buttered, battered, fried, or served with cream sauce. Items labeled as crispy are usually fried, unless stated otherwise. ? Choose water, low-fat milk, unsweetened iced tea, or other drinks without added sugar. If you want an alcoholic beverage, choose a lower-calorie option, such as a glass of wine or light beer. ? Ask for dressings, sauces, and syrups on the side. These are usually high in calories, so you should limit the amount you eat. ? If you want a salad, choose a garden salad and ask for grilled meats. Avoid extra toppings such as bacon, cheese, or fried items. Ask for the dressing on the side, or ask for olive oil and vinegar or lemon to use as dressing.  Estimate how many servings of a food you are given. Knowing serving sizes will help you be aware of how much food you are eating at restaurants. Where to find more information  Centers for Disease Control and Prevention: http://www.wolf.info/  [place] Department of Agriculture: http://www.wilson-mendoza.org/ Summary  Calorie counting means keeping track of how many calories  you eat and drink each day. If you eat fewer calories than your body needs, you should lose weight.  A healthy amount of weight to lose per week is usually 1-2 lb (0.5-0.9 kg). This usually means reducing your daily calorie intake by 500-750 calories.  The number of calories in a food can be found on a Nutrition Facts label. If a food does not have a Nutrition Facts label, try to look up the calories online or ask your dietitian for help.  Use smaller plates, glasses, and bowls for smaller portions and to prevent overeating.  Use your calories on foods and drinks that will fill you up and not leave you hungry shortly after a meal. This information is not intended to replace advice given to you by your health care  provider. Make sure you discuss any questions you have with your health care provider. Document Revised: 08/14/2019 Document Reviewed: 08/14/2019 Elsevier Patient Education  2021 [employer].

## 2020-10-11 ENCOUNTER — Other Ambulatory Visit: Payer: Self-pay | Admitting: Family Medicine

## 2020-10-11 ENCOUNTER — Other Ambulatory Visit: Payer: Self-pay | Admitting: Family

## 2020-10-11 DIAGNOSIS — I10 Essential (primary) hypertension: Secondary | ICD-10-CM

## 2020-10-11 DIAGNOSIS — M109 Gout, unspecified: Secondary | ICD-10-CM

## 2020-11-26 ENCOUNTER — Other Ambulatory Visit: Payer: Self-pay | Admitting: Family Medicine

## 2020-11-26 DIAGNOSIS — I10 Essential (primary) hypertension: Secondary | ICD-10-CM

## 2020-11-30 DIAGNOSIS — M79641 Pain in right hand: Secondary | ICD-10-CM | POA: Diagnosis not present

## 2020-11-30 DIAGNOSIS — R768 Other specified abnormal immunological findings in serum: Secondary | ICD-10-CM | POA: Diagnosis not present

## 2020-11-30 DIAGNOSIS — M109 Gout, unspecified: Secondary | ICD-10-CM | POA: Diagnosis not present

## 2020-11-30 DIAGNOSIS — M25461 Effusion, right knee: Secondary | ICD-10-CM | POA: Diagnosis not present

## 2020-11-30 DIAGNOSIS — Z79899 Other long term (current) drug therapy: Secondary | ICD-10-CM | POA: Diagnosis not present

## 2021-01-03 ENCOUNTER — Other Ambulatory Visit: Payer: Self-pay | Admitting: Family

## 2021-01-03 ENCOUNTER — Other Ambulatory Visit: Payer: Self-pay | Admitting: Family Medicine

## 2021-01-03 DIAGNOSIS — E7800 Pure hypercholesterolemia, unspecified: Secondary | ICD-10-CM

## 2021-01-03 DIAGNOSIS — I10 Essential (primary) hypertension: Secondary | ICD-10-CM

## 2021-01-05 ENCOUNTER — Other Ambulatory Visit: Payer: Self-pay | Admitting: Family

## 2021-01-05 DIAGNOSIS — I10 Essential (primary) hypertension: Secondary | ICD-10-CM

## 2021-01-06 ENCOUNTER — Other Ambulatory Visit: Payer: Self-pay | Admitting: Family Medicine

## 2021-01-06 DIAGNOSIS — M109 Gout, unspecified: Secondary | ICD-10-CM

## 2021-01-14 ENCOUNTER — Other Ambulatory Visit: Payer: Self-pay

## 2021-01-14 DIAGNOSIS — I10 Essential (primary) hypertension: Secondary | ICD-10-CM

## 2021-01-14 DIAGNOSIS — E7800 Pure hypercholesterolemia, unspecified: Secondary | ICD-10-CM

## 2021-01-14 MED ORDER — IRBESARTAN 300 MG PO TABS: 300.0000 mg | ORAL_TABLET | Freq: Every day | ORAL | 0 refills | Status: DC

## 2021-01-14 MED ORDER — ROSUVASTATIN CALCIUM 20 MG PO TABS: 20.0000 mg | ORAL_TABLET | ORAL | 0 refills | Status: DC

## 2021-02-24 DIAGNOSIS — Z713 Dietary counseling and surveillance: Secondary | ICD-10-CM | POA: Diagnosis not present

## 2021-03-07 DIAGNOSIS — M109 Gout, unspecified: Secondary | ICD-10-CM | POA: Diagnosis not present

## 2021-03-07 DIAGNOSIS — R768 Other specified abnormal immunological findings in serum: Secondary | ICD-10-CM | POA: Diagnosis not present

## 2021-03-07 DIAGNOSIS — Z79899 Other long term (current) drug therapy: Secondary | ICD-10-CM | POA: Diagnosis not present

## 2021-03-07 DIAGNOSIS — M79641 Pain in right hand: Secondary | ICD-10-CM | POA: Diagnosis not present

## 2021-04-05 ENCOUNTER — Other Ambulatory Visit: Payer: Self-pay | Admitting: Family Medicine

## 2021-04-05 DIAGNOSIS — I10 Essential (primary) hypertension: Secondary | ICD-10-CM

## 2021-04-06 ENCOUNTER — Other Ambulatory Visit: Payer: Self-pay

## 2021-04-06 ENCOUNTER — Encounter: Payer: Self-pay | Admitting: Family Medicine

## 2021-04-06 ENCOUNTER — Ambulatory Visit (INDEPENDENT_AMBULATORY_CARE_PROVIDER_SITE_OTHER): Payer: BC Managed Care – PPO

## 2021-04-06 ENCOUNTER — Ambulatory Visit: Payer: BC Managed Care – PPO | Admitting: Family Medicine

## 2021-04-06 VITALS — BP 140/78 | HR 65 | Temp 97.3°F | Ht 70.0 in | Wt 219.0 lb

## 2021-04-06 DIAGNOSIS — M25561 Pain in right knee: Secondary | ICD-10-CM

## 2021-04-06 DIAGNOSIS — Z0000 Encounter for general adult medical examination without abnormal findings: Secondary | ICD-10-CM | POA: Diagnosis not present

## 2021-04-06 DIAGNOSIS — R7309 Other abnormal glucose: Secondary | ICD-10-CM

## 2021-04-06 DIAGNOSIS — I10 Essential (primary) hypertension: Secondary | ICD-10-CM

## 2021-04-06 DIAGNOSIS — Z23 Encounter for immunization: Secondary | ICD-10-CM

## 2021-04-06 DIAGNOSIS — K760 Fatty (change of) liver, not elsewhere classified: Secondary | ICD-10-CM

## 2021-04-06 DIAGNOSIS — M1A9XX0 Chronic gout, unspecified, without tophus (tophi): Secondary | ICD-10-CM

## 2021-04-06 MED ORDER — CLONIDINE HCL 0.1 MG PO TABS: 0.1000 mg | ORAL_TABLET | Freq: Two times a day (BID) | ORAL | 1 refills | Status: DC

## 2021-04-06 NOTE — Progress Notes (Addendum)
Established Patient Office Visit  Subjective:  Patient ID: Eric Salas, male    DOB: 23-Aug-1955  Age: 65 y.o. MRN: [id number]  CC:  Chief Complaint  Patient presents with   Follow-up    6 month f/u HTN/chol/gout.  Not fasting today.  No concerns.  Wants flu shot.    HPI Eric Salas presents for a physical exam follow-up of hypertension, elevated cholesterol with fatty liver disease, history of gouty arthritis, right knee pain, and elevated glucose.  He is up-to-date on health maintenance.  Will receive a flu vaccine today.  He has been able to lose 8 pounds.  He is taking his blood pressure medicines as directed.  Blood pressure we obtained today is similar to what he has been finding when he checks it.  Denies headaches or blurred vision.  Continues follow-up with rheumatology.  They have started hydroxychloroquine to help with his other aches and pains.  Allopurinol has been increased to 300 mg daily.  Nonfasting today.  Past Medical History:  Diagnosis Date   Arthritis    Cataract    lt. eye beginning stage   Hyperlipidemia    Hypertension     Past Surgical History:  Procedure Laterality Date   COLONOSCOPY      Family History  Problem Relation Age of Onset   Cancer Mother    57 / [place] Mother    Alcohol abuse Father    Diabetes Father    Heart attack Father    Heart disease Father    High Cholesterol Father    High blood pressure Father    Stroke Father    Diabetes Brother    Hearing loss Brother    High Cholesterol Brother    Colon cancer Brother    Cancer Brother    Diabetes Brother    Drug abuse Brother    High Cholesterol Brother    Stomach cancer Brother    Rectal cancer Neg Hx    Esophageal cancer Neg Hx    Pancreatic cancer Neg Hx    Liver cancer Neg Hx    Prostate cancer Neg Hx     Social History   Socioeconomic History   Marital status: Married    Spouse name: Not on file   Number of children: Not on file    Years of education: Not on file   Highest education level: Not on file  Occupational History   Not on file  Tobacco Use   Smoking status: Former    Types: Cigarettes    Quit date: 10/18/2005    Years since quitting: 15.4   Smokeless tobacco: Never  Vaping Use   Vaping Use: Never used  Substance and Sexual Activity   Alcohol use: Not Currently    Comment: quit 15 years ago   Drug use: No   Sexual activity: Yes    Partners: Female  Other Topics Concern   Not on file  Social History Narrative   Not on file   Social Determinants of Health   Financial Resource Strain: Not on file  Food Insecurity: Not on file  Transportation Needs: Not on file  Physical Activity: Not on file  Stress: Not on file  Social Connections: Not on file  Intimate Partner Violence: Not on file    Outpatient Medications Prior to Visit  Medication Sig Dispense Refill   allopurinol (ZYLOPRIM) 300 MG tablet Take 300 mg by mouth daily.     amLODipine (NORVASC) 10 MG tablet Take  1 tablet (10 mg total) by mouth daily. 90 tablet 3   colchicine 0.6 MG tablet Take 1 tablet (0.6 mg total) by mouth 2 (two) times daily. As needed for attacks. 180 tablet 0   hydroxychloroquine (PLAQUENIL) 200 MG tablet Take 400 mg by mouth daily.     indomethacin (INDOCIN) 50 MG capsule TAKE 1 CAPSULE BY MOUTH THREE TIMES DAILY WITH MEALS AS NEEDED FOR GOUT 60 capsule 0   irbesartan (AVAPRO) 300 MG tablet Take 1 tablet by mouth once daily 90 tablet 0   allopurinol (ZYLOPRIM) 100 MG tablet Take 1 tablet (100 mg total) by mouth daily. (Patient taking differently: Take 300 mg by mouth daily.) 90 tablet 1   cimetidine (TAGAMET HB) 200 MG tablet Take 1 tablet (200 mg total) by mouth 2 (two) times daily for 30 days. 60 tablet 0   diclofenac sodium (VOLTAREN) 1 % GEL Apply 2 g topically 4 (four) times daily. As needed. (Patient not taking: Reported on 04/06/2021) 100 g 1   rosuvastatin (CRESTOR) 20 MG tablet Take 1 tablet (20 mg total) by  mouth every other day. 90 tablet 0   No facility-administered medications prior to visit.    Allergies  Allergen Reactions   Codeine Itching    ROS Review of Systems  Constitutional: Negative.   HENT: Negative.    Eyes:  Negative for photophobia and visual disturbance.  Respiratory: Negative.    Cardiovascular: Negative.   Gastrointestinal: Negative.  Negative for anal bleeding, blood in stool, constipation and diarrhea.  Endocrine: Negative for polyphagia and polyuria.  Genitourinary:  Negative for difficulty urinating, frequency and urgency.  Musculoskeletal:  Positive for arthralgias. Negative for gait problem.  Neurological:  Negative for weakness.  Psychiatric/Behavioral: Negative.  Negative for dysphoric mood. The patient is not nervous/anxious.      Objective:    Physical Exam Vitals and nursing note reviewed.  Constitutional:      General: He is not in acute distress.    Appearance: Normal appearance. He is not ill-appearing, toxic-appearing or diaphoretic.  HENT:     Head: Normocephalic and atraumatic.     Right Ear: Tympanic membrane, ear canal and external ear normal.     Left Ear: Ear canal and external ear normal.     Mouth/Throat:     Mouth: Mucous membranes are moist.     Pharynx: Oropharynx is clear. No oropharyngeal exudate or posterior oropharyngeal erythema.  Eyes:     General: No scleral icterus.       Right eye: No discharge.        Left eye: No discharge.     Extraocular Movements: Extraocular movements intact.     Conjunctiva/sclera: Conjunctivae normal.     Pupils: Pupils are equal, round, and reactive to light.  Neck:     Vascular: No carotid bruit.  Cardiovascular:     Rate and Rhythm: Normal rate and regular rhythm.  Pulmonary:     Effort: Pulmonary effort is normal.     Breath sounds: Normal breath sounds.  Abdominal:     General: Abdomen is flat. Bowel sounds are normal. There is no distension.     Palpations: There is no mass.      Tenderness: There is no abdominal tenderness. There is no guarding or rebound.     Hernia: No hernia is present.  Musculoskeletal:     Cervical back: No rigidity or tenderness.     Right lower leg: Swelling and bony tenderness present. No edema.  Left lower leg: No edema.     Comments: Some swelling over the proximal insertion of the patellar tendon.  It is not inflamed.  There is no tenderness to palpation.  Some tenderness to palpation just superior to the medial joint.  No effusion.  Lymphadenopathy:     Cervical: No cervical adenopathy.  Skin:    General: Skin is warm and dry.  Neurological:     Mental Status: He is alert and oriented to person, place, and time.  Psychiatric:        Mood and Affect: Mood normal.        Behavior: Behavior normal.    BP 140/78   Pulse 65   Temp (!) 97.3 F (36.3 C) (Temporal)   Ht 5\' 10"  (1.778 m)   Wt 219 lb (99.3 kg)   SpO2 98%   BMI 31.42 kg/m  Wt Readings from Last 3 Encounters:  04/06/21 219 lb (99.3 kg)  10/04/20 227 lb (103 kg)  10/03/19 228 lb 12.8 oz (103.8 kg)     Health Maintenance Due  Topic Date Due   Zoster Vaccines- Shingrix (1 of 2) Never done   COVID-19 Vaccine (4 - Booster for Moderna series) 10/15/2020    There are no preventive care reminders to display for this patient.  Lab Results  Component Value Date   TSH 1.08 06/11/2017   Lab Results  Component Value Date   WBC 4.2 10/04/2020   HGB 14.2 10/04/2020   HCT 41.8 10/04/2020   MCV 91.3 10/04/2020   PLT 139.0 (L) 10/04/2020   Lab Results  Component Value Date   NA 140 10/04/2020   K 4.5 10/04/2020   CO2 29 10/04/2020   GLUCOSE 146 (H) 10/04/2020   BUN 16 10/04/2020   CREATININE 1.01 10/04/2020   BILITOT 0.5 10/04/2020   ALKPHOS 59 10/04/2020   AST 26 10/04/2020   ALT 26 10/04/2020   PROT 6.4 10/04/2020   ALBUMIN 4.4 10/04/2020   CALCIUM 9.1 10/04/2020   ANIONGAP 10 01/03/2019   GFR 78.96 10/04/2020   Lab Results  Component Value Date    CHOL 148 10/04/2020   Lab Results  Component Value Date   HDL 40.70 10/04/2020   Lab Results  Component Value Date   LDLCALC 89 10/04/2020   Lab Results  Component Value Date   TRIG 94.0 10/04/2020   Lab Results  Component Value Date   CHOLHDL 4 10/04/2020   Lab Results  Component Value Date   HGBA1C 6.2 10/04/2020      Assessment & Plan:   Problem List Items Addressed This Visit       Other   Gout   Relevant Orders   Uric acid   Healthcare maintenance   Relevant Orders   Flu Vaccine QUAD 6+ mos PF IM (Fluarix Quad PF) (Completed)   Elevated glucose - Primary   Relevant Orders   Hemoglobin A1c   Other Visit Diagnoses     HTN (hypertension), benign       Relevant Medications   cloNIDine (CATAPRES) 0.1 MG tablet   Other Relevant Orders   CBC   Comprehensive metabolic panel   Urinalysis, Routine w reflex microscopic   NAFLD (nonalcoholic fatty liver disease)       Relevant Orders   Lipid panel   Right knee pain, unspecified chronicity       Relevant Orders   DG Knee Complete 4 Views Right       Meds ordered this encounter  Medications   cloNIDine (CATAPRES) 0.1 MG tablet    Sig: Take 1 tablet (0.1 mg total) by mouth 2 (two) times daily.    Dispense:  180 tablet    Refill:  1     Follow-up: Return in about 3 months (around 07/06/2021).  Information given on health maintenance and disease prevention.  Encouraged him to continue his weight loss journey.  Have added clonidine 0.1 mg twice daily to the amlodipine and Avapro.  Continue follow-up with rheumatology.  Checking x-ray of right knee.  Consider osteoarthritis.  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  He will continue to use kneepads.  Will return fasting for above ordered blood work.  Follow-up 3 months  Libby Maw, MD

## 2021-04-06 NOTE — Progress Notes (Unsigned)
error 

## 2021-04-07 NOTE — Addendum Note (Signed)
Addended by: Jon Billings on: 04/07/2021 01:04 PM   Modules accepted: Orders

## 2021-04-08 ENCOUNTER — Other Ambulatory Visit: Payer: BC Managed Care – PPO

## 2021-04-08 ENCOUNTER — Telehealth (HOSPITAL_BASED_OUTPATIENT_CLINIC_OR_DEPARTMENT_OTHER): Payer: Self-pay

## 2021-04-15 ENCOUNTER — Other Ambulatory Visit (INDEPENDENT_AMBULATORY_CARE_PROVIDER_SITE_OTHER): Payer: BC Managed Care – PPO

## 2021-04-15 ENCOUNTER — Other Ambulatory Visit: Payer: Self-pay

## 2021-04-15 DIAGNOSIS — R7309 Other abnormal glucose: Secondary | ICD-10-CM

## 2021-04-15 DIAGNOSIS — I10 Essential (primary) hypertension: Secondary | ICD-10-CM | POA: Diagnosis not present

## 2021-04-15 DIAGNOSIS — K760 Fatty (change of) liver, not elsewhere classified: Secondary | ICD-10-CM

## 2021-04-15 DIAGNOSIS — M1A9XX0 Chronic gout, unspecified, without tophus (tophi): Secondary | ICD-10-CM

## 2021-04-15 LAB — CBC
HCT: 43.5 % (ref 39.0–52.0)
Hemoglobin: 14.4 g/dL (ref 13.0–17.0)
MCHC: 33 g/dL (ref 30.0–36.0)
MCV: 92.5 fl (ref 78.0–100.0)
Platelets: 157 10*3/uL (ref 150.0–400.0)
RBC: 4.7 Mil/uL (ref 4.22–5.81)
RDW: 13.3 % (ref 11.5–15.5)
WBC: 4.4 10*3/uL (ref 4.0–10.5)

## 2021-04-15 LAB — COMPREHENSIVE METABOLIC PANEL
ALT: 20 U/L (ref 0–53)
AST: 18 U/L (ref 0–37)
Albumin: 4.3 g/dL (ref 3.5–5.2)
Alkaline Phosphatase: 53 U/L (ref 39–117)
BUN: 18 mg/dL (ref 6–23)
CO2: 28 mEq/L (ref 19–32)
Calcium: 9.1 mg/dL (ref 8.4–10.5)
Chloride: 104 mEq/L (ref 96–112)
Creatinine, Ser: 0.99 mg/dL (ref 0.40–1.50)
GFR: 80.58 mL/min (ref >60.00)
Glucose, Bld: 99 mg/dL (ref 70–99)
Potassium: 4.4 mEq/L (ref 3.5–5.1)
Sodium: 140 mEq/L (ref 135–145)
Total Bilirubin: 0.4 mg/dL (ref 0.2–1.2)
Total Protein: 6.7 g/dL (ref 6.0–8.3)

## 2021-04-15 LAB — LIPID PANEL
Cholesterol: 126 mg/dL (ref 0–200)
HDL: 40.2 mg/dL (ref >39.00)
LDL Cholesterol: 67 mg/dL (ref 0–99)
NonHDL: 85.83
Total CHOL/HDL Ratio: 3
Triglycerides: 94 mg/dL (ref 0.0–149.0)
VLDL: 18.8 mg/dL (ref 0.0–40.0)

## 2021-04-15 LAB — URINALYSIS, ROUTINE W REFLEX MICROSCOPIC
Bilirubin Urine: NEGATIVE
Hgb urine dipstick: NEGATIVE
Ketones, ur: NEGATIVE
Leukocytes,Ua: NEGATIVE
Nitrite: NEGATIVE
Specific Gravity, Urine: 1.015 (ref 1.000–1.030)
Total Protein, Urine: NEGATIVE
Urine Glucose: NEGATIVE
Urobilinogen, UA: 0.2 (ref 0.0–1.0)
WBC, UA: NONE SEEN — AB (ref 0–?)
pH: 6 (ref 5.0–8.0)

## 2021-04-15 LAB — URIC ACID: Uric Acid, Serum: 6.5 mg/dL (ref 4.0–7.8)

## 2021-04-15 LAB — HEMOGLOBIN A1C: Hgb A1c MFr Bld: 6 % (ref 4.6–6.5)

## 2021-04-15 NOTE — Progress Notes (Signed)
Per the orders of  Dr.Kremer pt is here for labs, pt tolerated draw well. ? ?

## 2021-05-02 ENCOUNTER — Other Ambulatory Visit (HOSPITAL_BASED_OUTPATIENT_CLINIC_OR_DEPARTMENT_OTHER): Payer: BC Managed Care – PPO

## 2021-05-30 ENCOUNTER — Other Ambulatory Visit: Payer: Self-pay

## 2021-05-30 ENCOUNTER — Ambulatory Visit (HOSPITAL_BASED_OUTPATIENT_CLINIC_OR_DEPARTMENT_OTHER)
Admission: RE | Admit: 2021-05-30 | Discharge: 2021-05-30 | Disposition: A | Discharge status: Home / Self Care | Payer: BC Managed Care – PPO | Source: Ambulatory Visit | Attending: Family Medicine | Admitting: Family Medicine

## 2021-05-30 DIAGNOSIS — M25561 Pain in right knee: Secondary | ICD-10-CM | POA: Insufficient documentation

## 2021-05-30 DIAGNOSIS — Z0389 Encounter for observation for other suspected diseases and conditions ruled out: Secondary | ICD-10-CM | POA: Diagnosis not present

## 2021-06-04 ENCOUNTER — Other Ambulatory Visit: Payer: Self-pay | Admitting: Family Medicine

## 2021-06-04 DIAGNOSIS — M109 Gout, unspecified: Secondary | ICD-10-CM

## 2021-07-04 ENCOUNTER — Other Ambulatory Visit: Payer: Self-pay | Admitting: Family Medicine

## 2021-07-04 DIAGNOSIS — I10 Essential (primary) hypertension: Secondary | ICD-10-CM

## 2021-07-04 DIAGNOSIS — E7800 Pure hypercholesterolemia, unspecified: Secondary | ICD-10-CM

## 2021-07-08 ENCOUNTER — Ambulatory Visit: Payer: BC Managed Care – PPO | Admitting: Family Medicine

## 2021-08-02 ENCOUNTER — Other Ambulatory Visit: Payer: Self-pay | Admitting: Family Medicine

## 2021-08-02 DIAGNOSIS — M109 Gout, unspecified: Secondary | ICD-10-CM

## 2021-08-29 ENCOUNTER — Encounter (INDEPENDENT_AMBULATORY_CARE_PROVIDER_SITE_OTHER): Payer: BC Managed Care – PPO | Admitting: Ophthalmology

## 2021-08-29 DIAGNOSIS — Z713 Dietary counseling and surveillance: Secondary | ICD-10-CM | POA: Diagnosis not present

## 2021-09-21 ENCOUNTER — Other Ambulatory Visit: Payer: Self-pay | Admitting: Family Medicine

## 2021-09-21 DIAGNOSIS — M109 Gout, unspecified: Secondary | ICD-10-CM

## 2021-09-22 ENCOUNTER — Encounter (INDEPENDENT_AMBULATORY_CARE_PROVIDER_SITE_OTHER): Payer: Self-pay | Admitting: Ophthalmology

## 2021-09-22 ENCOUNTER — Ambulatory Visit (INDEPENDENT_AMBULATORY_CARE_PROVIDER_SITE_OTHER): Payer: BC Managed Care – PPO | Admitting: Ophthalmology

## 2021-09-22 ENCOUNTER — Other Ambulatory Visit: Payer: Self-pay

## 2021-09-22 DIAGNOSIS — D3131 Benign neoplasm of right choroid: Secondary | ICD-10-CM

## 2021-09-22 DIAGNOSIS — H2512 Age-related nuclear cataract, left eye: Secondary | ICD-10-CM

## 2021-09-22 DIAGNOSIS — H2511 Age-related nuclear cataract, right eye: Secondary | ICD-10-CM

## 2021-09-22 DIAGNOSIS — H2513 Age-related nuclear cataract, bilateral: Secondary | ICD-10-CM | POA: Diagnosis not present

## 2021-09-22 NOTE — Assessment & Plan Note (Signed)
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  We will continue to observe ?

## 2021-09-22 NOTE — Progress Notes (Addendum)
09/22/2021     CHIEF COMPLAINT Patient presents for  Chief Complaint  Patient presents with   Retina Follow Up      HISTORY OF PRESENT ILLNESS: Eric Salas is a 66 y.o. male who presents to the clinic today for:   HPI     Retina Follow Up           Diagnosis: Other         Comments   1 yr fu ou fp Pt states no recent changes to medical history or vision. Pt denies Floaters and FOL.        Last edited by Silvestre Moment on 09/22/2021  8:03 AM.      Referring physician: Clent Jacks, MD [address] [place],  [place] [id number]  HISTORICAL INFORMATION:   Selected notes from the MEDICAL RECORD NUMBER    Lab Results  Component Value Date   HGBA1C 6.0 04/15/2021     CURRENT MEDICATIONS: No current outpatient medications on file. (Ophthalmic Drugs)   No current facility-administered medications for this visit. (Ophthalmic Drugs)   Current Outpatient Medications (Other)  Medication Sig   allopurinol (ZYLOPRIM) 300 MG tablet Take 300 mg by mouth daily.   amLODipine (NORVASC) 10 MG tablet Take 1 tablet (10 mg total) by mouth daily.   cimetidine (TAGAMET HB) 200 MG tablet Take 1 tablet (200 mg total) by mouth 2 (two) times daily for 30 days.   cloNIDine (CATAPRES) 0.1 MG tablet Take 1 tablet (0.1 mg total) by mouth 2 (two) times daily.   colchicine 0.6 MG tablet Take 1 tablet (0.6 mg total) by mouth 2 (two) times daily. As needed for attacks.   diclofenac sodium (VOLTAREN) 1 % GEL Apply 2 g topically 4 (four) times daily. As needed. (Patient not taking: Reported on 04/06/2021)   hydroxychloroquine (PLAQUENIL) 200 MG tablet Take 400 mg by mouth daily.   indomethacin (INDOCIN) 50 MG capsule TAKE 1 CAPSULE BY MOUTH THREE TIMES DAILY WITH MEALS AS NEEDED FOR GOUT   irbesartan (AVAPRO) 300 MG tablet Take 1 tablet by mouth once daily   rosuvastatin (CRESTOR) 20 MG tablet TAKE 1 TABLET BY MOUTH EVERY OTHER DAY   No current facility-administered medications for this  visit. (Other)      REVIEW OF SYSTEMS: ROS   Negative for: Constitutional, Gastrointestinal, Neurological, Skin, Genitourinary, Musculoskeletal, HENT, Endocrine, Cardiovascular, Eyes, Respiratory, Psychiatric, Allergic/Imm, Heme/Lymph Last edited by Silvestre Moment on 09/22/2021  8:03 AM.       ALLERGIES Allergies  Allergen Reactions   Codeine Itching    PAST MEDICAL HISTORY Past Medical History:  Diagnosis Date   Arthritis    Cataract    lt. eye beginning stage   Hyperlipidemia    Hypertension    Past Surgical History:  Procedure Laterality Date   COLONOSCOPY      FAMILY HISTORY Family History  Problem Relation Age of Onset   Cancer Mother    Miscarriages / [place] Mother    Alcohol abuse Father    Diabetes Father    Heart attack Father    Heart disease Father    High Cholesterol Father    High blood pressure Father    Stroke Father    Diabetes Brother    Hearing loss Brother    High Cholesterol Brother    Colon cancer Brother    Cancer Brother    Diabetes Brother    Drug abuse Brother    High Cholesterol Brother  Stomach cancer Brother    Rectal cancer Neg Hx    Esophageal cancer Neg Hx    Pancreatic cancer Neg Hx    Liver cancer Neg Hx    Prostate cancer Neg Hx     SOCIAL HISTORY Social History   Tobacco Use   Smoking status: Former    Types: Cigarettes    Quit date: 10/18/2005    Years since quitting: 15.9   Smokeless tobacco: Never  Vaping Use   Vaping Use: Never used  Substance Use Topics   Alcohol use: Not Currently    Comment: quit 15 years ago   Drug use: No         OPHTHALMIC EXAM:  Base Eye Exam     Visual Acuity (ETDRS)       Right Left   Dist [place] 20/25 -1 20/20 -1         Tonometry (Tonopen, 8:08 AM)       Right Left   Pressure 8 9         Pupils       Pupils Dark Light Shape React APD   Right PERRL 4 3 Round Brisk None   Left PERRL 4 3 Round Brisk None         Visual Fields       Left Right    Full  Full         Extraocular Movement       Right Left    Full Full         Neuro/Psych     Oriented x3: Yes   Mood/Affect: Normal         Dilation     Both eyes: 1.0% Mydriacyl, 2.5% Phenylephrine @ 8:08 AM           Slit Lamp and Fundus Exam     External Exam       Right Left   External Normal Normal         Slit Lamp Exam       Right Left   Lids/Lashes Normal Normal   Conjunctiva/Sclera White and quiet White and quiet   Cornea Clear Clear   Anterior Chamber Deep and quiet Deep and quiet   Iris Round and reactive Round and reactive   Lens 1+ Nuclear sclerosis 1+ Nuclear sclerosis   Anterior Vitreous Normal Normal         Fundus Exam       Right Left   Posterior Vitreous Normal Normal   Disc Normal Normal   C/D Ratio 0.25 0.25   Macula Normal Normal   Vessels Normal Normal   Periphery 2.2 x 2 dd flat nevus temporal to macula, with no high risk features.  No Subretinal fluid, no atrophy but no discernible thickness. Normal            IMAGING AND PROCEDURES  Imaging and Procedures for 09/22/21  OCT, Retina - OU - Both Eyes       Right Eye Quality was good. Scan locations included subfoveal. Central Foveal Thickness: 320.   Left Eye Quality was good. Scan locations included subfoveal. Central Foveal Thickness: 316.      Color Fundus Photography Optos - OU - Both Eyes       Right Eye Progression has been stable. Disc findings include normal observations. Macula : normal observations. Vessels : normal observations.   Left Eye Progression has been stable. Disc findings include normal observations. Macula : normal observations. Vessels : normal  observations.   Notes 1.  Choroidal nevus, small, temporal macula, flat with no change in color or pigmentation or size from March 2021.  Will continue to monitor OD.             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  We will continue to observe  Nuclear sclerotic cataract of left eye Moderate cataract left eye  Nuclear sclerotic cataract of right eye Moderate cataract right eye     ICD-10-CM   1. 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 Nuclear sclerotic cataract of left eye  H25.12     3. Nuclear sclerotic cataract of right eye  H25.11       1.  OD with small choroidal nevus, no interval change, no change over the last 2 years.  We will continue to monitor and observe  2.  OU, mucous chronic changes, minor in each eye no impact on acuity  3.  Ophthalmic Meds Ordered this visit:  No orders of the defined types were placed in this encounter.      Return in about 1 year (around 09/23/2022) for DILATE OU, COLOR FP, OCT.  There are no Patient Instructions on file for this visit.   Explained the diagnoses, plan, and follow up with the patient and they expressed understanding.  Patient expressed understanding of the importance of proper follow up care.   Clent Demark Lynnda Wiersma M.D. Diseases & Surgery of the Retina and Vitreous [facility] 09/22/21     Abbreviations: M myopia (nearsighted); A astigmatism; H hyperopia (farsighted); P presbyopia; Mrx spectacle prescription;  CTL contact lenses; OD right eye; OS left eye; OU both eyes  XT exotropia; ET esotropia; PEK punctate epithelial keratitis; PEE punctate epithelial erosions; DES dry eye syndrome; MGD meibomian gland dysfunction; ATs artificial tears; PFAT's preservative free artificial tears; [place] nuclear sclerotic cataract; PSC posterior subcapsular cataract; ERM epi-retinal membrane; PVD posterior vitreous detachment; RD retinal detachment; DM diabetes mellitus; DR diabetic retinopathy; NPDR non-proliferative diabetic retinopathy; PDR proliferative diabetic retinopathy; CSME  clinically significant macular edema; DME diabetic macular edema; dbh dot blot hemorrhages; CWS cotton wool spot; POAG primary open angle glaucoma; C/D cup-to-disc ratio; HVF humphrey visual field; GVF goldmann visual field; OCT optical coherence tomography; IOP intraocular pressure; BRVO Branch retinal vein occlusion; CRVO central retinal vein occlusion; CRAO central retinal artery occlusion; BRAO branch retinal artery occlusion; RT retinal tear; SB scleral buckle; PPV pars plana vitrectomy; VH Vitreous hemorrhage; PRP panretinal laser photocoagulation; IVK intravitreal kenalog; VMT vitreomacular traction; MH Macular hole;  NVD neovascularization of the disc; NVE neovascularization elsewhere; AREDS age related eye disease study; ARMD age related macular degeneration; POAG primary open angle glaucoma; EBMD epithelial/anterior basement membrane dystrophy; ACIOL anterior chamber intraocular lens; IOL intraocular lens; PCIOL posterior chamber intraocular lens; Phaco/IOL phacoemulsification with intraocular lens placement; [place] photorefractive keratectomy; LASIK laser assisted in situ keratomileusis; HTN hypertension; DM diabetes mellitus; COPD chronic obstructive pulmonary disease

## 2021-09-22 NOTE — Assessment & Plan Note (Signed)
Moderate cataract left eye ?

## 2021-09-22 NOTE — Assessment & Plan Note (Signed)
Moderate cataract right eye ?

## 2021-09-28 ENCOUNTER — Other Ambulatory Visit: Payer: Self-pay | Admitting: Family Medicine

## 2021-09-28 DIAGNOSIS — I10 Essential (primary) hypertension: Secondary | ICD-10-CM

## 2021-09-28 DIAGNOSIS — E7800 Pure hypercholesterolemia, unspecified: Secondary | ICD-10-CM

## 2021-09-29 ENCOUNTER — Other Ambulatory Visit: Payer: Self-pay

## 2021-09-29 MED ORDER — ALLOPURINOL 300 MG PO TABS: 300.0000 mg | ORAL_TABLET | Freq: Every day | ORAL | 0 refills | Status: DC

## 2021-10-07 ENCOUNTER — Encounter: Payer: Self-pay | Admitting: Family Medicine

## 2021-10-07 ENCOUNTER — Ambulatory Visit: Payer: BC Managed Care – PPO | Admitting: Family Medicine

## 2021-10-07 VITALS — BP 140/78 | HR 60 | Temp 97.0°F | Ht 70.0 in | Wt 220.8 lb

## 2021-10-07 DIAGNOSIS — I10 Essential (primary) hypertension: Secondary | ICD-10-CM

## 2021-10-07 DIAGNOSIS — Z0000 Encounter for general adult medical examination without abnormal findings: Secondary | ICD-10-CM

## 2021-10-07 DIAGNOSIS — Z23 Encounter for immunization: Secondary | ICD-10-CM

## 2021-10-07 DIAGNOSIS — E7800 Pure hypercholesterolemia, unspecified: Secondary | ICD-10-CM | POA: Diagnosis not present

## 2021-10-07 DIAGNOSIS — M1A9XX0 Chronic gout, unspecified, without tophus (tophi): Secondary | ICD-10-CM

## 2021-10-07 DIAGNOSIS — F4321 Adjustment disorder with depressed mood: Secondary | ICD-10-CM | POA: Diagnosis not present

## 2021-10-07 LAB — LDL CHOLESTEROL, DIRECT: Direct LDL: 98 mg/dL

## 2021-10-07 LAB — COMPREHENSIVE METABOLIC PANEL
ALT: 26 U/L (ref 0–53)
AST: 23 U/L (ref 0–37)
Albumin: 4.6 g/dL (ref 3.5–5.2)
Alkaline Phosphatase: 60 U/L (ref 39–117)
BUN: 19 mg/dL (ref 6–23)
CO2: 27 mEq/L (ref 19–32)
Calcium: 9.2 mg/dL (ref 8.4–10.5)
Chloride: 104 mEq/L (ref 96–112)
Creatinine, Ser: 1.07 mg/dL (ref 0.40–1.50)
GFR: 73.16 mL/min (ref >60.00)
Glucose, Bld: 159 mg/dL — ABNORMAL HIGH (ref 70–99)
Potassium: 4.3 mEq/L (ref 3.5–5.1)
Sodium: 139 mEq/L (ref 135–145)
Total Bilirubin: 0.6 mg/dL (ref 0.2–1.2)
Total Protein: 6.5 g/dL (ref 6.0–8.3)

## 2021-10-07 LAB — CBC
HCT: 42.3 % (ref 39.0–52.0)
Hemoglobin: 14.5 g/dL (ref 13.0–17.0)
MCHC: 34.3 g/dL (ref 30.0–36.0)
MCV: 91 fl (ref 78.0–100.0)
Platelets: 152 10*3/uL (ref 150.0–400.0)
RBC: 4.65 Mil/uL (ref 4.22–5.81)
RDW: 13.3 % (ref 11.5–15.5)
WBC: 4.2 10*3/uL (ref 4.0–10.5)

## 2021-10-07 LAB — URINALYSIS, ROUTINE W REFLEX MICROSCOPIC
Bilirubin Urine: NEGATIVE
Hgb urine dipstick: NEGATIVE
Ketones, ur: NEGATIVE
Leukocytes,Ua: NEGATIVE
Nitrite: NEGATIVE
RBC / HPF: NONE SEEN (ref 0–?)
Specific Gravity, Urine: 1.02 (ref 1.000–1.030)
Total Protein, Urine: NEGATIVE
Urine Glucose: NEGATIVE
Urobilinogen, UA: 0.2 (ref 0.0–1.0)
pH: 6 (ref 5.0–8.0)

## 2021-10-07 LAB — LIPID PANEL
Cholesterol: 151 mg/dL (ref 0–200)
HDL: 38.6 mg/dL — ABNORMAL LOW (ref >39.00)
LDL Cholesterol: 88 mg/dL (ref 0–99)
NonHDL: 112.55
Total CHOL/HDL Ratio: 4
Triglycerides: 125 mg/dL (ref 0.0–149.0)
VLDL: 25 mg/dL (ref 0.0–40.0)

## 2021-10-07 LAB — PSA: PSA: 2.59 ng/mL (ref 0.10–4.00)

## 2021-10-07 LAB — MICROALBUMIN / CREATININE URINE RATIO
Creatinine,U: 120.6 mg/dL
Microalb Creat Ratio: 0.8 mg/g (ref 0.0–30.0)
Microalb, Ur: 1 mg/dL (ref 0.0–1.9)

## 2021-10-07 NOTE — Progress Notes (Signed)
? ?Established Patient Office Visit ? ?Subjective:  ?Patient ID: Eric Salas, male    DOB: January 11, 1956  Age: 66 y.o. MRN: [id number] ? ?CC:  ?Chief Complaint  ?Patient presents with  ? Annual Exam  ?  CPE/labs. No concerns.  Fasting today.   ? ? ?HPI ?Eric Salas presents for a physical exam.  He is fasting today.  When I asked him how he was doing he said "I am hanging in there" when I asked him what that was about he told me he had lost his son back in October after an MVA.  Multiple deaths over this past year for him including his mother, brother and father-in-law.  Of course he and his wife are devastated.  He has been speaking with a psychologist at work.  He is having a hard time at his job. ? ?Past Medical History:  ?Diagnosis Date  ? Arthritis   ? Cataract   ? lt. eye beginning stage  ? Hyperlipidemia   ? Hypertension   ? ? ?Past Surgical History:  ?Procedure Laterality Date  ? COLONOSCOPY    ? ? ?Family History  ?Problem Relation Age of Onset  ? Cancer Mother   ? Miscarriages / [place] Mother   ? Alcohol abuse Father   ? Diabetes Father   ? Heart attack Father   ? Heart disease Father   ? High Cholesterol Father   ? High blood pressure Father   ? Stroke Father   ? Diabetes Brother   ? Hearing loss Brother   ? High Cholesterol Brother   ? Colon cancer Brother   ? Cancer Brother   ? Diabetes Brother   ? Drug abuse Brother   ? High Cholesterol Brother   ? Stomach cancer Brother   ? Rectal cancer Neg Hx   ? Esophageal cancer Neg Hx   ? Pancreatic cancer Neg Hx   ? Liver cancer Neg Hx   ? Prostate cancer Neg Hx   ? ? ?Social History  ? ?Socioeconomic History  ? Marital status: Married  ?  Spouse name: Not on file  ? Number of children: Not on file  ? Years of education: Not on file  ? Highest education level: Not on file  ?Occupational History  ? Not on file  ?Tobacco Use  ? Smoking status: Former  ?  Types: Cigarettes  ?  Quit date: 10/18/2005  ?  Years since quitting: 15.9  ? Smokeless tobacco:  Never  ?Vaping Use  ? Vaping Use: Never used  ?Substance and Sexual Activity  ? Alcohol use: Not Currently  ?  Comment: quit 15 years ago  ? Drug use: No  ? Sexual activity: Yes  ?  Partners: Female  ?Other Topics Concern  ? Not on file  ?Social History Narrative  ? Not on file  ? ?Social Determinants of Health  ? ?Financial Resource Strain: Not on file  ?Food Insecurity: Not on file  ?Transportation Needs: Not on file  ?Physical Activity: Not on file  ?Stress: Not on file  ?Social Connections: Not on file  ?Intimate Partner Violence: Not on file  ? ? ?Outpatient Medications Prior to Visit  ?Medication Sig Dispense Refill  ? allopurinol (ZYLOPRIM) 300 MG tablet Take 1 tablet (300 mg total) by mouth daily. 90 tablet 0  ? amLODipine (NORVASC) 10 MG tablet Take 1 tablet (10 mg total) by mouth daily. 90 tablet 3  ? cloNIDine (CATAPRES) 0.1 MG tablet Take 1 tablet (0.1  mg total) by mouth 2 (two) times daily. 180 tablet 1  ? Colchicine 0.6 MG CAPS Take 1 capsule by mouth daily.    ? colchicine 0.6 MG tablet 1 tablet    ? diclofenac sodium (VOLTAREN) 1 % GEL Apply 2 g topically 4 (four) times daily. As needed. 100 g 1  ? hydroxychloroquine (PLAQUENIL) 200 MG tablet Take 400 mg by mouth daily.    ? indomethacin (INDOCIN) 50 MG capsule TAKE 1 CAPSULE BY MOUTH THREE TIMES DAILY WITH MEALS AS NEEDED FOR GOUT 60 capsule 0  ? irbesartan (AVAPRO) 300 MG tablet Take 1 tablet by mouth once daily 90 tablet 0  ? rosuvastatin (CRESTOR) 20 MG tablet TAKE 1 TABLET BY MOUTH EVERY OTHER DAY 45 tablet 0  ? cimetidine (TAGAMET HB) 200 MG tablet Take 1 tablet (200 mg total) by mouth 2 (two) times daily for 30 days. 60 tablet 0  ? colchicine 0.6 MG tablet Take 1 tablet (0.6 mg total) by mouth 2 (two) times daily. As needed for attacks. 180 tablet 0  ? ?No facility-administered medications prior to visit.  ? ? ?Allergies  ?Allergen Reactions  ? Codeine Itching  ? ? ?ROS ?Review of Systems  ?Constitutional:  Negative for chills, diaphoresis,  fatigue, fever and unexpected weight change.  ?Eyes:  Negative for photophobia and visual disturbance.  ?Respiratory: Negative.    ?Cardiovascular: Negative.   ?Gastrointestinal: Negative.   ?Genitourinary: Negative.   ?Neurological:  Negative for speech difficulty, weakness and light-headedness.  ?Psychiatric/Behavioral:  Positive for dysphoric mood. Negative for self-injury and suicidal ideas.   ? ?  ?Objective:  ?  ?Physical Exam ?Vitals and nursing note reviewed.  ?Constitutional:   ?   General: He is not in acute distress. ?   Appearance: Normal appearance. He is not ill-appearing, toxic-appearing or diaphoretic.  ?HENT:  ?   Head: Normocephalic and atraumatic.  ?   Right Ear: External ear normal.  ?   Left Ear: External ear normal.  ?Eyes:  ?   General: No scleral icterus.    ?   Right eye: No discharge.     ?   Left eye: No discharge.  ?   Extraocular Movements: Extraocular movements intact.  ?   Conjunctiva/sclera: Conjunctivae normal.  ?Cardiovascular:  ?   Rate and Rhythm: Normal rate and regular rhythm.  ?Pulmonary:  ?   Effort: Pulmonary effort is normal.  ?   Breath sounds: Normal breath sounds.  ?Abdominal:  ?   General: Bowel sounds are normal.  ?Skin: ?   General: Skin is warm and dry.  ?Neurological:  ?   Mental Status: He is alert and oriented to person, place, and time.  ?Psychiatric:     ?   Mood and Affect: Mood normal. Affect is tearful.     ?   Behavior: Behavior normal.  ? ? ?BP 140/78   Pulse 60   Temp (!) 97 ?F (36.1 ?C) (Temporal)   Ht '5\' 10"'$  (1.778 m)   Wt 220 lb 12.8 oz (100.2 kg)   SpO2 98%   BMI 31.68 kg/m?  ?Wt Readings from Last 3 Encounters:  ?10/07/21 220 lb 12.8 oz (100.2 kg)  ?04/06/21 219 lb (99.3 kg)  ?10/04/20 227 lb (103 kg)  ? ? ? ?Health Maintenance Due  ?Topic Date Due  ? Zoster Vaccines- Shingrix (1 of 2) Never done  ? ? ?There are no preventive care reminders to display for this patient. ? ?Lab Results  ?Component Value  Date  ? TSH 1.08 06/11/2017  ? ?Lab Results   ?Component Value Date  ? WBC 4.4 04/15/2021  ? HGB 14.4 04/15/2021  ? HCT 43.5 04/15/2021  ? MCV 92.5 04/15/2021  ? PLT 157.0 04/15/2021  ? ?Lab Results  ?Component Value Date  ? NA 140 04/15/2021  ? K 4.4 04/15/2021  ? CO2 28 04/15/2021  ? GLUCOSE 99 04/15/2021  ? BUN 18 04/15/2021  ? CREATININE 0.99 04/15/2021  ? BILITOT 0.4 04/15/2021  ? ALKPHOS 53 04/15/2021  ? AST 18 04/15/2021  ? ALT 20 04/15/2021  ? PROT 6.7 04/15/2021  ? ALBUMIN 4.3 04/15/2021  ? CALCIUM 9.1 04/15/2021  ? ANIONGAP 10 01/03/2019  ? GFR 80.58 04/15/2021  ? ?Lab Results  ?Component Value Date  ? CHOL 126 04/15/2021  ? ?Lab Results  ?Component Value Date  ? HDL 40.20 04/15/2021  ? ?Lab Results  ?Component Value Date  ? [place] 67 04/15/2021  ? ?Lab Results  ?Component Value Date  ? TRIG 94.0 04/15/2021  ? ?Lab Results  ?Component Value Date  ? CHOLHDL 3 04/15/2021  ? ?Lab Results  ?Component Value Date  ? HGBA1C 6.0 04/15/2021  ? ? ?  ?Assessment & Plan:  ? ?Problem List Items Addressed This Visit   ? ?  ? Cardiovascular and Mediastinum  ? HTN (hypertension), benign  ? Relevant Orders  ? Comprehensive metabolic panel  ? Urinalysis, Routine w reflex microscopic  ? Microalbumin / creatinine urine ratio  ?  ? Musculoskeletal and Integument  ? Chronic gout involving toe of left foot without tophus  ? Relevant Orders  ? Comprehensive metabolic panel  ?  ? Other  ? Elevated LDL cholesterol level  ? Relevant Orders  ? Comprehensive metabolic panel  ? Lipid panel  ? LDL cholesterol, direct  ? Healthcare maintenance  ? Relevant Orders  ? CBC  ? Comprehensive metabolic panel  ? PSA  ? Need for shingles vaccine  ? Relevant Orders  ? Varicella-zoster vaccine IM (Shingrix)  ? Grieving - Primary  ? ? ?No orders of the defined types were placed in this encounter. ? ? ?Follow-up: Return in about 4 months (around 02/06/2022).  ?Patient will consider ongoing counseling with a psychologist friend at work.  Continue follow-up with sponsor.  See me back in 4 months  or sooner if needed..  Fasting lab work drawn today. ? ?Libby Maw, MD ?

## 2021-10-28 ENCOUNTER — Other Ambulatory Visit: Payer: Self-pay | Admitting: Family Medicine

## 2021-10-28 DIAGNOSIS — I10 Essential (primary) hypertension: Secondary | ICD-10-CM

## 2021-11-10 DIAGNOSIS — Z713 Dietary counseling and surveillance: Secondary | ICD-10-CM | POA: Diagnosis not present

## 2021-11-18 ENCOUNTER — Telehealth: Payer: Self-pay | Admitting: Family Medicine

## 2021-11-18 NOTE — Telephone Encounter (Signed)
Paperwork received placed on desk for review by Provider and sign.  ?

## 2021-11-18 NOTE — Telephone Encounter (Signed)
Pt has dropped off paperwork Biometric Screening Form for Dr. Ethelene Hal to fill out. I have placed paperwork in his folder upfront.  ?

## 2021-12-04 ENCOUNTER — Other Ambulatory Visit: Payer: Self-pay | Admitting: Family Medicine

## 2021-12-04 DIAGNOSIS — M109 Gout, unspecified: Secondary | ICD-10-CM

## 2022-01-03 ENCOUNTER — Other Ambulatory Visit: Payer: Self-pay | Admitting: Family Medicine

## 2022-01-03 DIAGNOSIS — M109 Gout, unspecified: Secondary | ICD-10-CM

## 2022-01-14 ENCOUNTER — Other Ambulatory Visit: Payer: Self-pay | Admitting: Family Medicine

## 2022-01-14 DIAGNOSIS — E7800 Pure hypercholesterolemia, unspecified: Secondary | ICD-10-CM

## 2022-01-14 DIAGNOSIS — I10 Essential (primary) hypertension: Secondary | ICD-10-CM

## 2022-01-17 ENCOUNTER — Other Ambulatory Visit: Payer: Self-pay | Admitting: Family Medicine

## 2022-01-17 DIAGNOSIS — I10 Essential (primary) hypertension: Secondary | ICD-10-CM

## 2022-01-23 ENCOUNTER — Telehealth: Payer: Self-pay | Admitting: Family Medicine

## 2022-01-23 ENCOUNTER — Ambulatory Visit: Payer: Self-pay | Admitting: Family Medicine

## 2022-01-23 NOTE — Telephone Encounter (Signed)
Pt was a no show 01/23/2022 for an OV with Dr. Ethelene Hal, I have sent out a no show letter

## 2022-02-03 ENCOUNTER — Encounter: Payer: Self-pay | Admitting: Family Medicine

## 2022-02-03 ENCOUNTER — Ambulatory Visit: Payer: 59 | Admitting: Family Medicine

## 2022-02-03 VITALS — BP 124/70 | HR 55 | Temp 97.5°F | Ht 70.0 in | Wt 197.4 lb

## 2022-02-03 DIAGNOSIS — R7309 Other abnormal glucose: Secondary | ICD-10-CM | POA: Diagnosis not present

## 2022-02-03 DIAGNOSIS — I10 Essential (primary) hypertension: Secondary | ICD-10-CM | POA: Diagnosis not present

## 2022-02-03 DIAGNOSIS — E7800 Pure hypercholesterolemia, unspecified: Secondary | ICD-10-CM

## 2022-02-03 DIAGNOSIS — F4321 Adjustment disorder with depressed mood: Secondary | ICD-10-CM

## 2022-02-03 DIAGNOSIS — M1A9XX0 Chronic gout, unspecified, without tophus (tophi): Secondary | ICD-10-CM

## 2022-02-03 LAB — BASIC METABOLIC PANEL
BUN: 23 mg/dL (ref 6–23)
CO2: 29 mEq/L (ref 19–32)
Calcium: 9.1 mg/dL (ref 8.4–10.5)
Chloride: 104 mEq/L (ref 96–112)
Creatinine, Ser: 0.92 mg/dL (ref 0.40–1.50)
GFR: 87.5 mL/min (ref >60.00)
Glucose, Bld: 100 mg/dL — ABNORMAL HIGH (ref 70–99)
Potassium: 4.4 mEq/L (ref 3.5–5.1)
Sodium: 140 mEq/L (ref 135–145)

## 2022-02-03 LAB — URIC ACID: Uric Acid, Serum: 5.5 mg/dL (ref 4.0–7.8)

## 2022-02-03 LAB — LDL CHOLESTEROL, DIRECT: Direct LDL: 82 mg/dL

## 2022-02-03 LAB — HEMOGLOBIN A1C: Hgb A1c MFr Bld: 5.8 % (ref 4.6–6.5)

## 2022-02-03 MED ORDER — AMLODIPINE BESYLATE 10 MG PO TABS: 10.0000 mg | ORAL_TABLET | Freq: Every day | ORAL | 1 refills | Status: DC

## 2022-02-03 NOTE — Progress Notes (Signed)
Established Patient Office Visit  Subjective   Patient ID: Eric Salas, male    DOB: Jul 06, 1956  Age: 66 y.o. MRN: [id number]  Chief Complaint  Patient presents with   Follow-up    Follow up on labs, no concerns. Patient fasting.     HPI making it and doing relatively well.  Has been working out and modified his diet.  Has been able to lose 23 pounds intentionally.  He and his wife continue to cope with the loss of their son.    Review of Systems  Constitutional: Negative.   HENT: Negative.    Eyes:  Negative for blurred vision, discharge and redness.  Respiratory: Negative.    Cardiovascular: Negative.   Gastrointestinal:  Negative for abdominal pain.  Genitourinary: Negative.   Musculoskeletal: Negative.  Negative for myalgias.  Skin:  Negative for rash.  Neurological:  Negative for tingling, loss of consciousness and weakness.  Endo/Heme/Allergies:  Negative for polydipsia.         02/03/2022    9:54 AM 10/07/2021    8:27 AM 10/04/2020    8:11 AM  Depression screen PHQ 2/9  Decreased Interest 0 1 0  Down, Depressed, Hopeless 0 1 0  PHQ - 2 Score 0 2 0  Altered sleeping  1   Tired, decreased energy  1   Change in appetite  1   Feeling bad or failure about yourself   1   Trouble concentrating  2   Moving slowly or fidgety/restless  3   Suicidal thoughts  0   PHQ-9 Score  11   Difficult doing work/chores  Not difficult at all      Objective:     BP 124/70 (BP Location: Right Arm, Patient Position: Sitting, Cuff Size: Normal)   Pulse (!) 55   Temp (!) 97.5 F (36.4 C) (Temporal)   Ht '5\' 10"'$  (1.778 m)   Wt 197 lb 6.4 oz (89.5 kg)   SpO2 99%   BMI 28.32 kg/m  Wt Readings from Last 3 Encounters:  02/03/22 197 lb 6.4 oz (89.5 kg)  10/07/21 220 lb 12.8 oz (100.2 kg)  04/06/21 219 lb (99.3 kg)      Physical Exam Constitutional:      General: He is not in acute distress.    Appearance: Normal appearance. He is not ill-appearing, toxic-appearing  or diaphoretic.  HENT:     Head: Normocephalic and atraumatic.     Right Ear: External ear normal.     Left Ear: External ear normal.  Eyes:     General: No scleral icterus.       Right eye: No discharge.        Left eye: No discharge.     Extraocular Movements: Extraocular movements intact.     Conjunctiva/sclera: Conjunctivae normal.  Cardiovascular:     Rate and Rhythm: Normal rate.  Pulmonary:     Effort: Pulmonary effort is normal. No respiratory distress.  Skin:    General: Skin is warm and dry.  Neurological:     Mental Status: He is alert and oriented to person, place, and time.  Psychiatric:        Mood and Affect: Mood normal.        Behavior: Behavior normal.      No results found for any visits on 02/03/22.    The 10-year ASCVD risk score (Arnett DK, et al., 2019) is: 13.4%    Assessment & Plan:   Problem List Items Addressed  This Visit       Cardiovascular and Mediastinum   Hypertension goal BP (blood pressure) < 140/80   Relevant Medications   amLODipine (NORVASC) 10 MG tablet     Musculoskeletal and Integument   Chronic gout involving toe of left foot without tophus - Primary   Relevant Orders   Uric acid     Other   Elevated LDL cholesterol level   Relevant Orders   LDL cholesterol, direct   Elevated glucose   Relevant Orders   Basic metabolic panel   Hemoglobin A1c   Grieving    Return in about 6 months (around 08/06/2022), or taper clonidine as directed..  Clonidine taper over 2 weeks.  Looking for pressures in the less than 140 over less than 80 range.  Continue weight loss efforts.  Continue working out.  It is good therapy.  Should see an improvement in fasting blood sugar.  Checking hemoglobin A1c.  Would like to see improvement in LDL cholesterol.  Libby Maw, MD

## 2022-02-05 ENCOUNTER — Other Ambulatory Visit: Payer: Self-pay | Admitting: Family Medicine

## 2022-02-05 DIAGNOSIS — M109 Gout, unspecified: Secondary | ICD-10-CM

## 2022-02-13 NOTE — Telephone Encounter (Signed)
1st no show, fee waived, letter sent 

## 2022-02-16 ENCOUNTER — Other Ambulatory Visit: Payer: Self-pay | Admitting: Family Medicine

## 2022-02-16 DIAGNOSIS — I10 Essential (primary) hypertension: Secondary | ICD-10-CM

## 2022-03-03 ENCOUNTER — Other Ambulatory Visit: Payer: Self-pay | Admitting: Family Medicine

## 2022-03-03 DIAGNOSIS — M109 Gout, unspecified: Secondary | ICD-10-CM

## 2022-03-31 ENCOUNTER — Ambulatory Visit: Payer: 59 | Admitting: Nurse Practitioner

## 2022-03-31 ENCOUNTER — Encounter: Payer: Self-pay | Admitting: Nurse Practitioner

## 2022-03-31 VITALS — BP 146/80 | HR 56 | Temp 98.0°F | Ht 70.0 in | Wt 196.4 lb

## 2022-03-31 DIAGNOSIS — H01004 Unspecified blepharitis left upper eyelid: Secondary | ICD-10-CM

## 2022-03-31 MED ORDER — ERYTHROMYCIN 5 MG/GM OP OINT: TOPICAL_OINTMENT | OPHTHALMIC | 0 refills | Status: DC

## 2022-03-31 NOTE — Patient Instructions (Signed)
Apply warm compress 3x/day, 80mns each time Use ointment after warm compress application. Call office if no improvement in 1week.  Blepharitis Blepharitis is swelling of the eyelids. It can cause the eyes to feel dry or gritty. Other symptoms may include: Reddish, scaly skin around the scalp and eyebrows. Eyelids that itch or burn. Fluid that leaks from the eye at night. This causes the eyelashes to stick together in the morning. Eyelashes that fall out. Redness of the eyes. Eyes that are sensitive to light. Follow these instructions at home: Watch for any changes in how your eyes look or feel. Tell your doctor about any changes. Follow these instructions to help with your condition. Keeping clean Wash your hands often with soap and water for at least 20 seconds. Clean your eyes. Wash the edges of your eyelids using eyelid wipes or a small amount of baby shampoo that has been mixed with warm water (diluted). Do this 2 or more times a day. Wash your face and eyebrows at least once a day. Use a clean towel each time you dry your eyelids. Do not use the towel to clean or dry other areas of your body. Do not share your towel with anyone. General instructions Avoid wearing makeup until you get better. Do not share makeup with anyone. Avoid rubbing your eyes. Use a warm compress on your eyes for 5-10 minutes at a time. Do this 1 or 2 times a day, or as told by your doctor. You can use: A towel with warm water on it. A heating pad that can be warmed in the microwave. The pad should be very warm but not hot enough to burn the skin. If you were given an antibiotic cream or eye drops, use the medicine as told by your doctor. Do not stop using the medicine even if you feel better. Keep all follow-up visits. Contact a doctor if: Your eyelids feel hot. You have blisters on your eyelids. You have a rash on your eyelids. The swelling does not go away in 2-4 days. The swelling gets worse. Get  help right away if: You have pain that gets worse or spreads to other parts of your face. You have redness that gets worse or spreads to other parts of your face. You have changes in how you see (vision). You have pain when you look at lights or things that move. You have a fever. Summary Blepharitis is swelling of the eyelids. Watch for any changes in how your eyes look or feel. Tell your doctor about any changes. Follow home care instructions as told by your doctor. Wash your hands often with soap and water for at least 20 seconds. Avoid wearing makeup. Do not rub your eyes. Use a warm compress, creams, or eye drops as told by your doctor. Let your doctor know if you have changes in how you see, blisters or a rash on your eyelids, or other problems. This information is not intended to replace advice given to you by your health care provider. Make sure you discuss any questions you have with your health care provider. Document Revised: 08/04/2020 Document Reviewed: 08/04/2020 Elsevier Patient Education  2[place]

## 2022-03-31 NOTE — Progress Notes (Signed)
                Established Patient Visit  Patient: Eric Salas   DOB: 01-31-1956   66 y.o. Male  MRN: [id number] Visit Date: 03/31/2022  Subjective:    Chief Complaint  Patient presents with   Acute Visit    C/o left eye irritation x 3 days    Eye Problem  The left eye is affected. This is a new problem. The current episode started in the past 7 days. The problem occurs constantly. The problem has been unchanged. The injury mechanism is unknown. The pain is mild. There is No known exposure to pink eye. He Does not wear contacts. Associated symptoms include eye redness and a foreign body sensation. Pertinent negatives include no blurred vision, eye discharge, double vision, fever, itching, nausea, photophobia, recent URI or vomiting. He has tried nothing for the symptoms.   Reviewed medical, surgical, and social history today  Medications: Outpatient Medications Prior to Visit  Medication Sig   allopurinol (ZYLOPRIM) 300 MG tablet Take 1 tablet by mouth once daily   amLODipine (NORVASC) 10 MG tablet Take 1 tablet (10 mg total) by mouth daily.   cloNIDine (CATAPRES) 0.1 MG tablet Take 1 tablet (0.1 mg total) by mouth 2 (two) times daily.   indomethacin (INDOCIN) 50 MG capsule TAKE 1 CAPSULE BY MOUTH THREE TIMES DAILY WITH MEALS AS NEEDED FOR GOUT   irbesartan (AVAPRO) 300 MG tablet Take 1 tablet by mouth once daily   rosuvastatin (CRESTOR) 20 MG tablet TAKE 1 TABLET BY MOUTH EVERY OTHER DAY   No facility-administered medications prior to visit.   Reviewed past medical and social history.   ROS per HPI above      Objective:  BP (!) 146/80 (BP Location: Right Arm, Patient Position: Sitting, Cuff Size: Normal)   Pulse (!) 56   Temp 98 F (36.7 C) (Temporal)   Ht '5\' 10"'$  (1.778 m)   Wt 196 lb 6.4 oz (89.1 kg)   SpO2 97%   BMI 28.18 kg/m      Physical Exam Eyes:     General: Lids are everted, no foreign bodies appreciated. Vision grossly intact. No scleral icterus.        Right eye: No foreign body, discharge or hordeolum.        Left eye: Hordeolum present.No foreign body or discharge.     Extraocular Movements:     Right eye: Normal extraocular motion and no nystagmus.     Left eye: Normal extraocular motion and no nystagmus.     Conjunctiva/sclera:     Right eye: Right conjunctiva is not injected. No chemosis, exudate or hemorrhage.    Left eye: Left conjunctiva is not injected. No chemosis, exudate or hemorrhage.     Comments: Swelling and redness on left upper lid  Neurological:     Mental Status: He is alert.     No results found for any visits on 03/31/22.    Assessment & Plan:    Problem List Items Addressed This Visit   None Visit Diagnoses     Blepharitis of left upper eyelid, unspecified type    -  Primary   Relevant Medications   erythromycin ophthalmic ointment      Return if symptoms worsen or fail to improve.     Wilfred Lacy, NP

## 2022-04-07 ENCOUNTER — Other Ambulatory Visit: Payer: Self-pay | Admitting: Family Medicine

## 2022-04-07 DIAGNOSIS — M109 Gout, unspecified: Secondary | ICD-10-CM

## 2022-04-14 ENCOUNTER — Other Ambulatory Visit: Payer: Self-pay | Admitting: Family Medicine

## 2022-04-14 DIAGNOSIS — I10 Essential (primary) hypertension: Secondary | ICD-10-CM

## 2022-05-02 ENCOUNTER — Other Ambulatory Visit: Payer: Self-pay | Admitting: Family Medicine

## 2022-05-02 DIAGNOSIS — M109 Gout, unspecified: Secondary | ICD-10-CM

## 2022-05-28 ENCOUNTER — Other Ambulatory Visit: Payer: Self-pay | Admitting: Family Medicine

## 2022-05-28 DIAGNOSIS — E7800 Pure hypercholesterolemia, unspecified: Secondary | ICD-10-CM

## 2022-06-06 ENCOUNTER — Encounter: Payer: Self-pay | Admitting: Family Medicine

## 2022-06-06 ENCOUNTER — Telehealth: Payer: Self-pay | Admitting: Family Medicine

## 2022-06-06 ENCOUNTER — Ambulatory Visit: Payer: 59 | Admitting: Family Medicine

## 2022-06-06 VITALS — BP 126/68 | HR 64 | Temp 97.5°F | Ht 70.0 in | Wt 200.8 lb

## 2022-06-06 DIAGNOSIS — M109 Gout, unspecified: Secondary | ICD-10-CM

## 2022-06-06 DIAGNOSIS — M19042 Primary osteoarthritis, left hand: Secondary | ICD-10-CM

## 2022-06-06 DIAGNOSIS — M19041 Primary osteoarthritis, right hand: Secondary | ICD-10-CM | POA: Diagnosis not present

## 2022-06-06 DIAGNOSIS — I471 Supraventricular tachycardia, unspecified: Secondary | ICD-10-CM | POA: Insufficient documentation

## 2022-06-06 DIAGNOSIS — J019 Acute sinusitis, unspecified: Secondary | ICD-10-CM

## 2022-06-06 DIAGNOSIS — I10 Essential (primary) hypertension: Secondary | ICD-10-CM | POA: Diagnosis not present

## 2022-06-06 MED ORDER — AMOXICILLIN 875 MG PO TABS: 875.0000 mg | ORAL_TABLET | Freq: Two times a day (BID) | ORAL | 0 refills | Status: DC

## 2022-06-06 MED ORDER — INDOMETHACIN 50 MG PO CAPS: ORAL_CAPSULE | ORAL | 1 refills | Status: DC

## 2022-06-06 MED ORDER — FLUNISOLIDE 25 MCG/ACT (0.025%) NA SOLN: 2.0000 | Freq: Two times a day (BID) | NASAL | 2 refills | Status: DC

## 2022-06-06 NOTE — Telephone Encounter (Signed)
Caller Name: Eric Salas Call back phone #: [phone]  Reason for Call: Please change prescriptions (3) that were given today to the  [place], [place] - [address]. He would like all future prescriptions sent through [facility].

## 2022-06-06 NOTE — Progress Notes (Signed)
Established Patient Office Visit  Subjective   Patient ID: Eric Salas, male    DOB: 1955/11/16  Age: 66 y.o. MRN: [id number]  Chief Complaint  Patient presents with   Tachycardia    Concerns about tachycardia and elevated BP readings.     HPI experienced an episode of tachycardia this morning while at the gym on the treadmill.  Pulse increased to 121.  Usually stays in the 100 or less range while walking.  He gets up to 121 when he runs but not as he walks.  There is no chest pain or shortness of breath.  He has been dealing with chronic nasal congestion and purulent rhinorrhea over the last month or so.  There has been no facial pressure or teeth pain.  He was  experiencing significant nasal congestion while on the treadmill.  He had consumed his usual 2 cups of caffeine prior to going to the gym.  Denies using any decongestants for his nasal congestion.  Continues with clonidine and amlodipine for hypertension.  Lost a brother to metastatic prostate cancer a few months ago.  His brother was 6.  He takes 1 Indocin daily for chronic hand pain.  He has not had a gouty attack since starting allopurinol.    Review of Systems  Constitutional: Negative.   HENT: Negative.    Eyes:  Negative for blurred vision, discharge and redness.  Respiratory: Negative.  Negative for shortness of breath.   Cardiovascular: Negative.  Negative for chest pain.  Gastrointestinal:  Negative for abdominal pain.  Genitourinary: Negative.   Musculoskeletal:  Positive for joint pain. Negative for myalgias.  Skin:  Negative for rash.  Neurological:  Negative for tingling, loss of consciousness and weakness.  Endo/Heme/Allergies:  Negative for polydipsia.      Objective:     BP 126/68 (BP Location: Right Arm, Patient Position: Sitting, Cuff Size: Normal)   Pulse 64   Temp (!) 97.5 F (36.4 C) (Temporal)   Ht '5\' 10"'$  (1.778 m)   Wt 200 lb 12.8 oz (91.1 kg)   SpO2 98%   BMI 28.81 kg/m     Physical Exam Constitutional:      General: He is not in acute distress.    Appearance: Normal appearance. He is not ill-appearing, toxic-appearing or diaphoretic.  HENT:     Head: Normocephalic and atraumatic.     Right Ear: External ear normal.     Left Ear: External ear normal.     Mouth/Throat:     Mouth: Mucous membranes are moist.     Pharynx: Oropharynx is clear. No oropharyngeal exudate or posterior oropharyngeal erythema.  Eyes:     General: No scleral icterus.       Right eye: No discharge.        Left eye: No discharge.     Extraocular Movements: Extraocular movements intact.     Conjunctiva/sclera: Conjunctivae normal.     Pupils: Pupils are equal, round, and reactive to light.  Cardiovascular:     Rate and Rhythm: Normal rate and regular rhythm.  Pulmonary:     Effort: Pulmonary effort is normal. No respiratory distress.     Breath sounds: Normal breath sounds.  Musculoskeletal:     Cervical back: No rigidity or tenderness.  Skin:    General: Skin is warm and dry.  Neurological:     Mental Status: He is alert and oriented to person, place, and time.  Psychiatric:        Mood  and Affect: Mood normal.        Behavior: Behavior normal.      No results found for any visits on 06/06/22.    The 10-year ASCVD risk score (Arnett DK, et al., 2019) is: 13.7%    Assessment & Plan:   Problem List Items Addressed This Visit       Cardiovascular and Mediastinum   HTN (hypertension), benign - Primary   Paroxysmal supraventricular tachycardia   Relevant Orders   EKG 12-Lead     Respiratory   Acute sinusitis   Relevant Medications   amoxicillin (AMOXIL) 875 MG tablet   flunisolide (NASALIDE) 25 MCG/ACT (0.025%) SOLN     Musculoskeletal and Integument   Arthritis of both hands   Relevant Medications   indomethacin (INDOCIN) 50 MG capsule   Other Visit Diagnoses     Gout, unspecified cause, unspecified chronicity, unspecified site       Relevant  Medications   indomethacin (INDOCIN) 50 MG capsule       Return in about 6 months (around 12/05/2022), or if symptoms worsen or fail to improve.Believe that the tachycardia could been due to hypoxia associated with his nasal congestion.  Checking EKG today.  We will continue to monitor.  Suggested a fit bit with an EKG monitor built into it.  Will return with further symptoms.  Will allow 1 Indocin tablet daily.  EKG today shows normal sinus rhythm.  Normal R wave progression through the precordial leads.  There is no ST segment elevation or depression.  There were no T wave inversions.   Eric Maw, MD

## 2022-06-07 ENCOUNTER — Other Ambulatory Visit: Payer: Self-pay

## 2022-06-07 DIAGNOSIS — J019 Acute sinusitis, unspecified: Secondary | ICD-10-CM

## 2022-06-07 DIAGNOSIS — M109 Gout, unspecified: Secondary | ICD-10-CM

## 2022-06-07 MED ORDER — AMOXICILLIN 875 MG PO TABS: 875.0000 mg | ORAL_TABLET | Freq: Two times a day (BID) | ORAL | 0 refills | Status: AC

## 2022-06-07 MED ORDER — INDOMETHACIN 50 MG PO CAPS: ORAL_CAPSULE | ORAL | 1 refills | Status: DC

## 2022-06-07 MED ORDER — FLUNISOLIDE 25 MCG/ACT (0.025%) NA SOLN: 2.0000 | Freq: Two times a day (BID) | NASAL | 2 refills | Status: DC

## 2022-06-07 NOTE — Telephone Encounter (Signed)
Pt was seen yesterday 06/06/22, he needs all his meds(the 3 from yesterday) sent to [facility], [place]

## 2022-06-07 NOTE — Telephone Encounter (Signed)
Canceled prescriptions that were sent to [facility] and sent to [facility] patient aware and will pick up.

## 2022-06-07 NOTE — Telephone Encounter (Signed)
Rx canceled for [facility] and sent to [facility] patient aware

## 2022-07-11 ENCOUNTER — Telehealth: Payer: Self-pay | Admitting: Family Medicine

## 2022-07-11 NOTE — Telephone Encounter (Signed)
Caller Name: Cashtyn Pouliot Call back phone #: [phone]  Reason for Call: Pt said he is still dealing with problem from visit in November. Heart rate is up again. Please call pt, should he come back in for another visit?

## 2022-07-11 NOTE — Telephone Encounter (Signed)
Spoke with patient who states that he have started back feeling like his heart is racing patient would like to see a cardiologist. Per patient he will call cardiology to see if he will need a referral if not he will schedule an appointment or call to schedule an appointment for referral her.

## 2022-07-11 NOTE — Telephone Encounter (Signed)
Pt called and have questions about the cardiologist referral. Pt stated if you give him a referral for a location for tomorrow in [place] they can see him. Pt said please give him a call

## 2022-07-11 NOTE — Telephone Encounter (Signed)
Patient called back schedule appointment for evaluation and referral

## 2022-07-14 ENCOUNTER — Ambulatory Visit: Payer: 59 | Admitting: Nurse Practitioner

## 2022-07-14 ENCOUNTER — Encounter: Payer: Self-pay | Admitting: Nurse Practitioner

## 2022-07-14 VITALS — BP 130/70 | HR 62 | Temp 97.7°F | Ht 70.0 in | Wt 207.2 lb

## 2022-07-14 DIAGNOSIS — R000 Tachycardia, unspecified: Secondary | ICD-10-CM | POA: Insufficient documentation

## 2022-07-14 DIAGNOSIS — R0683 Snoring: Secondary | ICD-10-CM | POA: Diagnosis not present

## 2022-07-14 DIAGNOSIS — M109 Gout, unspecified: Secondary | ICD-10-CM | POA: Diagnosis not present

## 2022-07-14 DIAGNOSIS — I10 Essential (primary) hypertension: Secondary | ICD-10-CM | POA: Diagnosis not present

## 2022-07-14 DIAGNOSIS — E7800 Pure hypercholesterolemia, unspecified: Secondary | ICD-10-CM

## 2022-07-14 LAB — CBC
HCT: 42.7 % (ref 39.0–52.0)
Hemoglobin: 14.3 g/dL (ref 13.0–17.0)
MCHC: 33.6 g/dL (ref 30.0–36.0)
MCV: 93.6 fl (ref 78.0–100.0)
Platelets: 150 10*3/uL (ref 150.0–400.0)
RBC: 4.56 Mil/uL (ref 4.22–5.81)
RDW: 13.3 % (ref 11.5–15.5)
WBC: 6.5 10*3/uL (ref 4.0–10.5)

## 2022-07-14 LAB — BASIC METABOLIC PANEL
BUN: 26 mg/dL — ABNORMAL HIGH (ref 6–23)
CO2: 28 mEq/L (ref 19–32)
Calcium: 9.2 mg/dL (ref 8.4–10.5)
Chloride: 104 mEq/L (ref 96–112)
Creatinine, Ser: 0.96 mg/dL (ref 0.40–1.50)
GFR: 82.88 mL/min (ref >60.00)
Glucose, Bld: 109 mg/dL — ABNORMAL HIGH (ref 70–99)
Potassium: 4.2 mEq/L (ref 3.5–5.1)
Sodium: 140 mEq/L (ref 135–145)

## 2022-07-14 LAB — TSH: TSH: 1.11 u[IU]/mL (ref 0.35–5.50)

## 2022-07-14 MED ORDER — IRBESARTAN 300 MG PO TABS: 300.0000 mg | ORAL_TABLET | Freq: Every day | ORAL | 1 refills | Status: DC

## 2022-07-14 MED ORDER — CLONIDINE HCL 0.1 MG PO TABS: 0.1000 mg | ORAL_TABLET | Freq: Two times a day (BID) | ORAL | 1 refills | Status: DC

## 2022-07-14 MED ORDER — AMLODIPINE BESYLATE 10 MG PO TABS: 10.0000 mg | ORAL_TABLET | Freq: Every day | ORAL | 1 refills | Status: DC

## 2022-07-14 MED ORDER — ROSUVASTATIN CALCIUM 20 MG PO TABS: 20.0000 mg | ORAL_TABLET | ORAL | 2 refills | Status: DC

## 2022-07-14 MED ORDER — ALLOPURINOL 300 MG PO TABS: 300.0000 mg | ORAL_TABLET | Freq: Every day | ORAL | 3 refills | Status: DC

## 2022-07-14 MED ORDER — INDOMETHACIN 50 MG PO CAPS: ORAL_CAPSULE | ORAL | 1 refills | Status: DC

## 2022-07-14 NOTE — Assessment & Plan Note (Signed)
Chronic, stable.  Continue allopurinol 300 mg daily and indomethacin 50 mg daily as needed for acute flareup.  Refill sent to the pharmacy.

## 2022-07-14 NOTE — Assessment & Plan Note (Signed)
He has been experiencing an elevated heart rate for the past few weeks along with some palpitations.  He had an EKG done last visit which showed sinus bradycardia with no ST or T wave changes.  With ongoing symptoms we will check a BMP, CBC, TSH today.  Will also place referral to cardiology.  He drinks 4 cups of coffee per day, recommend cutting back on this if able and making sure that he is drinking plenty of fluids.

## 2022-07-14 NOTE — Patient Instructions (Signed)
It was great to see you!  We are checking your labs today and will let you know the results via mychart/phone.   I have placed a referral to cardiology and sleep medicine for a sleep study.   I have refilled your medications to the mail in pharmacy for you.   Keep drinking plenty of fluids.   Let's follow-up with Dr. Ethelene Hal at your next scheduled appointment, sooner if you have concerns.  If a referral was placed today, you will be contacted for an appointment. Please note that routine referrals can sometimes take up to 3-4 weeks to process. Please call our office if you haven't heard anything after this time frame.  Take care,  Vance Peper, NP

## 2022-07-14 NOTE — Assessment & Plan Note (Signed)
Chronic, stable.  He would like his medication sent into a mail-in pharmacy, clonidine point 1 mg twice daily, amlodipine 10 mg daily, irbesartan 300 mg daily refill sent to the pharmacy.

## 2022-07-14 NOTE — Progress Notes (Signed)
Acute Office Visit  Subjective:     Patient ID: Eric Salas, male    DOB: 11/12/1955, 66 y.o.   MRN: [id number]  Chief Complaint  Patient presents with   Office Visit    C/o tachycardia, says heart rate has been elevated lately, needs referral to cardiologist     HPI Patient is in today for follow-up on elevated heart rate.   He notes that he is still having an elevated heart rate even when he is just walking.  He states that when he went to go walk on a treadmill before he started he checked his heart rate and it was 118.  This is abnormal for him, since he exercises routinely.  He denies chest pain.  He does state that his heart does race at times.  He endorses drinking 4 cups of coffee a day, 2 in the morning and 2 in the evening.  He also endorses snoring and has been waking up feeling like he isn't breathing or is holding his breath at night.  He has been having trouble sleeping recently as well.  He has been more anxious and worried about his health since this is started.  He is concerned about his heart and wants to make sure everything is okay.  ROS See pertinent positives and negatives per HPI.     Objective:    BP 130/70 (BP Location: Right Arm, Cuff Size: Large)   Pulse 62   Temp 97.7 F (36.5 C) (Temporal)   Ht '5\' 10"'$  (1.778 m)   Wt 207 lb 3.2 oz (94 kg)   SpO2 97%   BMI 29.73 kg/m    Physical Exam Vitals and nursing note reviewed.  Constitutional:      Appearance: Normal appearance.  HENT:     Head: Normocephalic.  Eyes:     Conjunctiva/sclera: Conjunctivae normal.  Cardiovascular:     Rate and Rhythm: Normal rate and regular rhythm.     Pulses: Normal pulses.     Heart sounds: Normal heart sounds.  Pulmonary:     Effort: Pulmonary effort is normal.     Breath sounds: Normal breath sounds.  Musculoskeletal:     Cervical back: Normal range of motion.  Skin:    General: Skin is warm.  Neurological:     General: No focal deficit present.      Mental Status: He is alert and oriented to person, place, and time.  Psychiatric:        Mood and Affect: Mood normal.        Behavior: Behavior normal.        Thought Content: Thought content normal.        Judgment: Judgment normal.       Assessment & Plan:   Problem List Items Addressed This Visit       Cardiovascular and Mediastinum   HTN (hypertension), benign    Chronic, stable.  He would like his medication sent into a mail-in pharmacy, clonidine point 1 mg twice daily, amlodipine 10 mg daily, irbesartan 300 mg daily refill sent to the pharmacy.      Relevant Medications   allopurinol (ZYLOPRIM) 300 MG tablet   amLODipine (NORVASC) 10 MG tablet   cloNIDine (CATAPRES) 0.1 MG tablet   irbesartan (AVAPRO) 300 MG tablet   rosuvastatin (CRESTOR) 20 MG tablet     Other   Elevated LDL cholesterol level    Chronic, stable.  Continue rosuvastatin 20 mg daily.  Refill sent to the  pharmacy.      Relevant Medications   rosuvastatin (CRESTOR) 20 MG tablet   Gout    Chronic, stable.  Continue allopurinol 300 mg daily and indomethacin 50 mg daily as needed for acute flareup.  Refill sent to the pharmacy.      Relevant Medications   indomethacin (INDOCIN) 50 MG capsule   Sinus tachycardia - Primary    He has been experiencing an elevated heart rate for the past few weeks along with some palpitations.  He had an EKG done last visit which showed sinus bradycardia with no ST or T wave changes.  With ongoing symptoms we will check a BMP, CBC, TSH today.  Will also place referral to cardiology.  He drinks 4 cups of coffee per day, recommend cutting back on this if able and making sure that he is drinking plenty of fluids.      Relevant Orders   Basic metabolic panel   CBC   TSH   Ambulatory referral to Cardiology   Other Visit Diagnoses     Snoring       He has been experiencing snoring and stopping breathing at night.  Will place referral to sleep medicine for sleep study.    Relevant Orders   Ambulatory referral to Sleep Studies       Meds ordered this encounter  Medications   allopurinol (ZYLOPRIM) 300 MG tablet    Sig: Take 1 tablet (300 mg total) by mouth daily.    Dispense:  90 tablet    Refill:  3   amLODipine (NORVASC) 10 MG tablet    Sig: Take 1 tablet (10 mg total) by mouth daily.    Dispense:  90 tablet    Refill:  1   cloNIDine (CATAPRES) 0.1 MG tablet    Sig: Take 1 tablet (0.1 mg total) by mouth 2 (two) times daily.    Dispense:  180 tablet    Refill:  1   indomethacin (INDOCIN) 50 MG capsule    Sig: May take 1 daily with food as needed for pain pends.    Dispense:  90 capsule    Refill:  1   irbesartan (AVAPRO) 300 MG tablet    Sig: Take 1 tablet (300 mg total) by mouth daily.    Dispense:  90 tablet    Refill:  1   rosuvastatin (CRESTOR) 20 MG tablet    Sig: Take 1 tablet (20 mg total) by mouth every other day.    Dispense:  90 tablet    Refill:  2    Return if symptoms worsen or fail to improve.  Charyl Dancer, NP

## 2022-07-14 NOTE — Assessment & Plan Note (Signed)
Chronic, stable.  Continue rosuvastatin 20 mg daily.  Refill sent to the pharmacy.

## 2022-07-17 DIAGNOSIS — Z9889 Other specified postprocedural states: Secondary | ICD-10-CM

## 2022-07-17 HISTORY — DX: Other specified postprocedural states: Z98.890

## 2022-07-26 ENCOUNTER — Ambulatory Visit: Payer: 59 | Attending: Cardiology | Admitting: Cardiology

## 2022-07-26 ENCOUNTER — Ambulatory Visit (INDEPENDENT_AMBULATORY_CARE_PROVIDER_SITE_OTHER): Payer: 59

## 2022-07-26 ENCOUNTER — Encounter: Payer: Self-pay | Admitting: Cardiology

## 2022-07-26 VITALS — BP 130/78 | HR 46 | Ht 70.0 in | Wt 206.2 lb

## 2022-07-26 DIAGNOSIS — E785 Hyperlipidemia, unspecified: Secondary | ICD-10-CM | POA: Diagnosis not present

## 2022-07-26 DIAGNOSIS — R002 Palpitations: Secondary | ICD-10-CM | POA: Diagnosis not present

## 2022-07-26 DIAGNOSIS — I10 Essential (primary) hypertension: Secondary | ICD-10-CM

## 2022-07-26 DIAGNOSIS — I6529 Occlusion and stenosis of unspecified carotid artery: Secondary | ICD-10-CM | POA: Diagnosis not present

## 2022-07-26 NOTE — Progress Notes (Signed)
Cardiology Office Note:    Date:  07/26/2022   ID:  Eric Salas, DOB January 27, 1956, MRN [id number]  PCP:  Libby Maw, MD  Cardiologist:  None  Electrophysiologist:  None   Referring MD: Charyl Dancer, NP   Chief Complaint  Patient presents with   Palpitations    History of Present Illness:    Eric Salas is a 67 y.o. male with a hx of hypertension, hyperlipidemia who is referred by Vance Peper, NP for evaluation of palpitations.  He reports that he is very active, works out twice daily 4 days/week and once daily for another 2 days/week.  States that recently he checked his heart rate at the gym and it was up to 120 bpm before he had started exercising.  He started feeling palpitations like his heart was racing.  Has happened 5 times since then.  Typically palpitations and tachycardia will last 30 to 60 minutes.  He denies any exertional symptoms.  Denies any chest pain, dyspnea, lightheadedness, syncope, lower extremity edema.  He smoked 2 packs/day for 25 years, quit in 2007.  Family history includes father had MI in 47s.  Reports he was told he had carotid stenosis on a ultrasound 15 years ago.   Past Medical History:  Diagnosis Date   Arthritis    Cataract    lt. eye beginning stage   Hyperlipidemia    Hypertension     Past Surgical History:  Procedure Laterality Date   COLONOSCOPY      Current Medications: Current Meds  Medication Sig   allopurinol (ZYLOPRIM) 300 MG tablet Take 1 tablet (300 mg total) by mouth daily.   amLODipine (NORVASC) 10 MG tablet Take 1 tablet (10 mg total) by mouth daily.   indomethacin (INDOCIN) 50 MG capsule May take 1 daily with food as needed for pain pends.   irbesartan (AVAPRO) 300 MG tablet Take 300 mg by mouth daily.   rosuvastatin (CRESTOR) 20 MG tablet Take 1 tablet (20 mg total) by mouth every other day.   [DISCONTINUED] cloNIDine (CATAPRES) 0.1 MG tablet Take 1 tablet (0.1 mg total) by mouth 2 (two)  times daily.   [DISCONTINUED] flunisolide (NASALIDE) 25 MCG/ACT (0.025%) SOLN Place 2 sprays into the nose 2 (two) times daily.     Allergies:   Codeine   Social History   Socioeconomic History   Marital status: Married    Spouse name: Not on file   Number of children: Not on file   Years of education: Not on file   Highest education level: Not on file  Occupational History   Not on file  Tobacco Use   Smoking status: Former    Types: Cigarettes    Quit date: 10/18/2005    Years since quitting: 16.7   Smokeless tobacco: Never  Vaping Use   Vaping Use: Never used  Substance and Sexual Activity   Alcohol use: Not Currently    Comment: quit 15 years ago   Drug use: No   Sexual activity: Yes    Partners: Female  Other Topics Concern   Not on file  Social History Narrative   Not on file   Social Determinants of Health   Financial Resource Strain: Low Risk  (06/04/2017)   Overall Financial Resource Strain (CARDIA)    Difficulty of Paying Living Expenses: Not hard at all  Food Insecurity: No Food Insecurity (06/04/2017)   Hunger Vital Sign    Worried About Eric Salas in the  Last Year: Never true    Ran Out of Food in the Last Year: Never true  Transportation Needs: Not on file  Physical Activity: Not on file  Stress: Not on file  Social Connections: Not on file     Family History: The patient's family history includes Alcohol abuse in his father; Cancer in his brother and mother; Colon cancer in his brother; Diabetes in his brother, brother, and father; Drug abuse in his brother; Hearing loss in his brother; Heart attack in his father; Heart disease in his father; High Cholesterol in his brother, brother, and father; High blood pressure in his father; Miscarriages / Stillbirths in his mother; Stomach cancer in his brother; Stroke in his father. There is no history of Rectal cancer, Esophageal cancer, Pancreatic cancer, Liver cancer, or Prostate cancer.  ROS:    Please see the history of present illness.     All other systems reviewed and are negative.  EKGs/Labs/Other Studies Reviewed:    The following studies were reviewed today:   EKG:   07/26/22: sinus bradycardia, rate 46, no ST abnormalities  Recent Labs: 10/07/2021: ALT 26 07/14/2022: BUN 26; Creatinine, Ser 0.96; Hemoglobin 14.3; Platelets 150.0; Potassium 4.2; Sodium 140; TSH 1.11  Recent Lipid Panel    Component Value Date/Time   CHOL 151 10/07/2021 0831   TRIG 125.0 10/07/2021 0831   HDL 38.60 (L) 10/07/2021 0831   CHOLHDL 4 10/07/2021 0831   VLDL 25.0 10/07/2021 0831   LDLCALC 88 10/07/2021 0831   LDLDIRECT 82.0 02/03/2022 1029    Physical Exam:    VS:  BP 130/78   Pulse (!) 46   Ht '5\' 10"'$  (1.778 m)   Wt 206 lb 3.2 oz (93.5 kg)   SpO2 98%   BMI 29.59 kg/m     Wt Readings from Last 3 Encounters:  07/26/22 206 lb 3.2 oz (93.5 kg)  07/14/22 207 lb 3.2 oz (94 kg)  06/06/22 200 lb 12.8 oz (91.1 kg)     GEN:  Well nourished, well developed in no acute distress HEENT: Normal NECK: No JVD; No carotid bruits LYMPHATICS: No lymphadenopathy CARDIAC: bradycardia, regular no murmurs, rubs, gallops RESPIRATORY:  Clear to auscultation without rales, wheezing or rhonchi  ABDOMEN: Soft, non-tender, non-distended MUSCULOSKELETAL:  No edema; No deformity  SKIN: Warm and dry NEUROLOGIC:  Alert and oriented x 3 PSYCHIATRIC:  Normal affect   ASSESSMENT:    1. Palpitations   2. Stenosis of carotid artery, unspecified laterality   3. Essential hypertension   4. Hyperlipidemia, unspecified hyperlipidemia type    PLAN:    Palpitations/tachycardia: Description concerning for arrhythmia, evaluate with Zio patch x 14 days.  Check echocardiogram to rule out structural heart disease.  Hypertension: BP medications listed as amlodipine 10 mg daily, clonidine 0.1 mg twice daily, irbesartan 300 mg daily.  However he reports he has been off clonidine.  Would favor avoiding clonidine  given his low resting heart rate.  BP currently appears controlled on amlodipine and irbesartan.  Hyperlipidemia: On rosuvastatin 20 mg every other day.  Reports had myalgias with taking daily.  LDL 88 on 10/07/2021  Snoring: Recently referred for sleep study  Carotid stenosis: Reports he was told he had carotid stenosis on a ultrasound 15 years ago.  Check carotid duplex  RTC in 3 months   Medication Adjustments/Labs and Tests Ordered: Current medicines are reviewed at length with the patient today.  Concerns regarding medicines are outlined above.  Orders Placed This Encounter  Procedures  LONG TERM MONITOR (3-14 DAYS)   EKG 12-Lead   ECHOCARDIOGRAM COMPLETE   VAS US CAROTID   No orders of the defined types were placed in this encounter.   Patient Instructions  Medication Instructions:  Your physician recommends that you continue on your current medications as directed. Please refer to the Current Medication list given to you today.  *If you need a refill on your cardiac medications before your next appointment, please call your pharmacy*  Testing/Procedures: Your physician has requested that you have an echocardiogram. Echocardiography is a painless test that uses sound waves to create images of your heart. It provides your doctor with information about the size and shape of your heart and how well your heart's chambers and valves are working. This procedure takes approximately one hour. There are no restrictions for this procedure. Please do NOT wear cologne, perfume, aftershave, or lotions (deodorant is allowed). Please arrive 15 minutes prior to your appointment time.  Your physician has requested that you have a carotid duplex. This test is an ultrasound of the carotid arteries in your neck. It looks at blood flow through these arteries that supply the brain with blood. Allow one hour for this exam. There are no restrictions or special instructions.  ZIO XT- Long Term  Monitor Instructions   Your physician has requested you wear a ZIO patch monitor for _14_ days.  This is a single patch monitor.   [employer] supplies one patch monitor per enrollment. Additional stickers are not available. Please do not apply patch if you will be having a Nuclear Stress Test, Echocardiogram, Cardiac CT, MRI, or Chest Xray during the period you would be wearing the monitor. The patch cannot be worn during these tests. You cannot remove and re-apply the ZIO XT patch monitor.  Your ZIO patch monitor will be sent Fed Ex from [employer] directly to your home address. It may take 3-5 days to receive your monitor after you have been enrolled.  Once you have received your monitor, please review the enclosed instructions. Your monitor has already been registered assigning a specific monitor serial # to you.  Billing and Patient Assistance Program Information   We have supplied [employer] with any of your insurance information on file for billing purposes. [employer] offers a sliding scale Patient Assistance Program for patients that do not have insurance, or whose insurance does not completely cover the cost of the ZIO monitor.   You must apply for the Patient Assistance Program to qualify for this discounted rate.     To apply, please call [employer] at [phone], select option 4, then select option 2, and ask to apply for Patient Assistance Program.  Theodore Demark will ask your household income, and how many people are in your household.  They will quote your out-of-pocket cost based on that information.  [employer] will also be able to set up a 44-month interest-free payment plan if needed.  Applying the monitor   Shave hair from upper left chest.  Hold abrader disc by orange tab. Rub abrader in 40 strokes over the upper left chest as indicated in your monitor instructions.  Clean area with 4 enclosed alcohol pads. Let dry.  Apply patch as indicated in monitor instructions. Patch will be placed  under collarbone on left side of chest with arrow pointing upward.  Rub patch adhesive wings for 2 minutes. Remove white label marked "1". Remove the white label marked "2". Rub patch adhesive wings for 2 additional minutes.  While looking in  a mirror, press and release button in center of patch. A small green light will flash 3-4 times. This will be your only indicator that the monitor has been turned on. ?  Do not shower for the first 24 hours. You may shower after the first 24 hours.  Press the button if you feel a symptom. You will hear a small click. Record Date, Time and Symptom in the Patient Logbook.  When you are ready to remove the patch, follow instructions on the last 2 pages of the Patient Logbook. Stick patch monitor onto the last page of Patient Logbook.  Place Patient Logbook in the blue and white box.  Use locking tab on box and tape box closed securely.  The blue and white box has prepaid postage on it. Please place it in the mailbox as soon as possible. Your physician should have your test results approximately 7 days after the monitor has been mailed back to [facility].  Call [facility] at [phone] if you have questions regarding your ZIO XT patch monitor. Call them immediately if you see an orange light blinking on your monitor.  If your monitor falls off in less than 4 days, contact our Monitor department at [phone]. ?If your monitor becomes loose or falls off after 4 days call [employer] at [phone] for suggestions on securing your monitor.?  Follow-Up: At [facility], you and your health needs are our priority.  As part of our continuing mission to provide you with exceptional heart care, we have created designated Provider Care Teams.  These Care Teams include your primary Cardiologist (physician) and Advanced Practice Providers (APPs -  Physician Assistants and Nurse Practitioners) who all work together to provide you with the care you  need, when you need it.  We recommend signing up for the patient portal called "MyChart".  Sign up information is provided on this After Visit Summary.  MyChart is used to connect with patients for Virtual Visits (Telemedicine).  Patients are able to view lab/test results, encounter notes, upcoming appointments, etc.  Non-urgent messages can be sent to your provider as well.   To learn more about what you can do with MyChart, go to [email or web address].    Your next appointment:   3 month(s)  The format for your next appointment:   In Person  Provider:   Dr. Gardiner Rhyme       Signed, Donato Heinz, MD  07/26/2022 8:43 AM    [place]

## 2022-07-26 NOTE — Progress Notes (Unsigned)
Enrolled patient for a 14 day Zio XT  monitor to be mailed to patients home  °

## 2022-07-26 NOTE — Patient Instructions (Signed)
Medication Instructions:  Your physician recommends that you continue on your current medications as directed. Please refer to the Current Medication list given to you today.  *If you need a refill on your cardiac medications before your next appointment, please call your pharmacy*  Testing/Procedures: Your physician has requested that you have an echocardiogram. Echocardiography is a painless test that uses sound waves to create images of your heart. It provides your doctor with information about the size and shape of your heart and how well your heart's chambers and valves are working. This procedure takes approximately one hour. There are no restrictions for this procedure. Please do NOT wear cologne, perfume, aftershave, or lotions (deodorant is allowed). Please arrive 15 minutes prior to your appointment time.  Your physician has requested that you have a carotid duplex. This test is an ultrasound of the carotid arteries in your neck. It looks at blood flow through these arteries that supply the brain with blood. Allow one hour for this exam. There are no restrictions or special instructions.  ZIO XT- Long Term Monitor Instructions   Your physician has requested you wear a ZIO patch monitor for _14_ days.  This is a single patch monitor.   [employer] supplies one patch monitor per enrollment. Additional stickers are not available. Please do not apply patch if you will be having a Nuclear Stress Test, Echocardiogram, Cardiac CT, MRI, or Chest Xray during the period you would be wearing the monitor. The patch cannot be worn during these tests. You cannot remove and re-apply the ZIO XT patch monitor.  Your ZIO patch monitor will be sent Fed Ex from [employer] directly to your home address. It may take 3-5 days to receive your monitor after you have been enrolled.  Once you have received your monitor, please review the enclosed instructions. Your monitor has already been registered assigning  a specific monitor serial # to you.  Billing and Patient Assistance Program Information   We have supplied [employer] with any of your insurance information on file for billing purposes. [employer] offers a sliding scale Patient Assistance Program for patients that do not have insurance, or whose insurance does not completely cover the cost of the ZIO monitor.   You must apply for the Patient Assistance Program to qualify for this discounted rate.     To apply, please call [employer] at [phone], select option 4, then select option 2, and ask to apply for Patient Assistance Program.  Theodore Demark will ask your household income, and how many people are in your household.  They will quote your out-of-pocket cost based on that information.  [employer] will also be able to set up a 44-month interest-free payment plan if needed.  Applying the monitor   Shave hair from upper left chest.  Hold abrader disc by orange tab. Rub abrader in 40 strokes over the upper left chest as indicated in your monitor instructions.  Clean area with 4 enclosed alcohol pads. Let dry.  Apply patch as indicated in monitor instructions. Patch will be placed under collarbone on left side of chest with arrow pointing upward.  Rub patch adhesive wings for 2 minutes. Remove white label marked "1". Remove the white label marked "2". Rub patch adhesive wings for 2 additional minutes.  While looking in a mirror, press and release button in center of patch. A small green light will flash 3-4 times. This will be your only indicator that the monitor has been turned on. ?  Do not shower for  the first 24 hours. You may shower after the first 24 hours.  Press the button if you feel a symptom. You will hear a small click. Record Date, Time and Symptom in the Patient Logbook.  When you are ready to remove the patch, follow instructions on the last 2 pages of the Patient Logbook. Stick patch monitor onto the last page of Patient Logbook.  Place Patient  Logbook in the blue and white box.  Use locking tab on box and tape box closed securely.  The blue and white box has prepaid postage on it. Please place it in the mailbox as soon as possible. Your physician should have your test results approximately 7 days after the monitor has been mailed back to [facility].  Call [place] at [phone] if you have questions regarding your ZIO XT patch monitor. Call them immediately if you see an orange light blinking on your monitor.  If your monitor falls off in less than 4 days, contact our Monitor department at [phone]. ?If your monitor becomes loose or falls off after 4 days call [employer] at [phone] for suggestions on securing your monitor.?  Follow-Up: At [facility], you and your health needs are our priority.  As part of our continuing mission to provide you with exceptional heart care, we have created designated Provider Care Teams.  These Care Teams include your primary Cardiologist (physician) and Advanced Practice Providers (APPs -  Physician Assistants and Nurse Practitioners) who all work together to provide you with the care you need, when you need it.  We recommend signing up for the patient portal called "MyChart".  Sign up information is provided on this After Visit Summary.  MyChart is used to connect with patients for Virtual Visits (Telemedicine).  Patients are able to view lab/test results, encounter notes, upcoming appointments, etc.  Non-urgent messages can be sent to your provider as well.   To learn more about what you can do with MyChart, go to [email or web address].    Your next appointment:   3 month(s)  The format for your next appointment:   In Person  Provider:   Dr. Gardiner Rhyme

## 2022-07-28 DIAGNOSIS — R002 Palpitations: Secondary | ICD-10-CM

## 2022-08-03 ENCOUNTER — Ambulatory Visit (INDEPENDENT_AMBULATORY_CARE_PROVIDER_SITE_OTHER): Payer: 59 | Admitting: Neurology

## 2022-08-03 ENCOUNTER — Encounter: Payer: Self-pay | Admitting: Neurology

## 2022-08-03 VITALS — BP 136/78 | HR 52 | Ht 70.0 in | Wt 206.6 lb

## 2022-08-03 DIAGNOSIS — R0683 Snoring: Secondary | ICD-10-CM

## 2022-08-03 DIAGNOSIS — Z9189 Other specified personal risk factors, not elsewhere classified: Secondary | ICD-10-CM | POA: Diagnosis not present

## 2022-08-03 DIAGNOSIS — R002 Palpitations: Secondary | ICD-10-CM

## 2022-08-03 DIAGNOSIS — R0681 Apnea, not elsewhere classified: Secondary | ICD-10-CM

## 2022-08-03 DIAGNOSIS — R0689 Other abnormalities of breathing: Secondary | ICD-10-CM

## 2022-08-03 DIAGNOSIS — E663 Overweight: Secondary | ICD-10-CM

## 2022-08-03 DIAGNOSIS — R001 Bradycardia, unspecified: Secondary | ICD-10-CM

## 2022-08-03 NOTE — Patient Instructions (Signed)

## 2022-08-03 NOTE — Progress Notes (Signed)
Subjective:    Patient ID: Eric Salas is a 67 y.o. male.  HPI    Star Age, MD, PhD [facility] [address], [address].O. [place], [place] [address]  Dear Eric Salas,  I saw your patient, Eric Salas, upon your kind request in my sleep clinic today for initial consultation of his sleep disorder, in particular, concern for underlying obstructive sleep apnea.  The patient is unaccompanied today.  As you know, Eric Salas is a 67 year old male with an underlying medical history of hypertension, gout, hyperlipidemia, sinus tachycardia and overweight state, who reports snoring and excessive daytime somnolence as well as waking up with a sense of gasping for air.  His Epworth sleepiness score is 10 out of 24, fatigue severity score is 28 out of 63.  He is currently wearing a Zio patch, has completed almost 1 week.  I reviewed your office note from 07/14/2022. He was recently seen by cardiology for palpitations on 07/26/22 and I reviewed the visit note. His EKG showed sinus bradycardia.  He was advised to pursue further workup.  He is going to complete his Zio patch next week.  He has a carotid Doppler scheduled for tomorrow and echocardiogram for next week.  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  He lives with his wife, they have grown children, between the 2 of them they have 5 kids and 12 grandchildren.  He has 1 dog in the household and they have about 30 chickens.  He has woken up with a sense of gasping for air.  His wife also snores, he does have a TV in the bedroom but turns it off in the middle of the night.  He has difficulty maintaining sleep.  He has been very active, he goes to the gym every morning, 6 days a week and 1 day a week he rides his bike.  He does abdominal crunches every morning.  He drinks caffeine typically in the morning, 2 cups of coffee feet and very occasionally 1 or 2 cups in the  evening.  He has lost weight, lost up to 40 pounds and gained some weight back.  No family history of sleep apnea.  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  His Past Medical History Is Significant For: Past Medical History:  Diagnosis Date   Arthritis    Cataract    lt. eye beginning stage   Hyperlipidemia    Hypertension     His Past Surgical History Is Significant For: Past Surgical History:  Procedure Laterality Date   COLONOSCOPY      His Family History Is Significant For: Family History  Problem Relation Age of Onset   Cancer Mother    90 / [place] Mother    Alcohol abuse Father    Diabetes Father    Heart attack Father    Heart disease Father    High Cholesterol Father    High blood pressure Father    Stroke Father    Diabetes Brother    Hearing loss Brother    High Cholesterol Brother    Colon cancer Brother    Cancer Brother    Diabetes Brother    Drug abuse Brother    High Cholesterol Brother    Stomach cancer Brother    Rectal cancer Neg Hx    Esophageal cancer Neg Hx  Pancreatic cancer Neg Hx    Liver cancer Neg Hx    Prostate cancer Neg Hx     His Social History Is Significant For: Social History   Socioeconomic History   Marital status: Married    Spouse name: Not on file   Number of children: Not on file   Years of education: Not on file   Highest education level: Not on file  Occupational History   Not on file  Tobacco Use   Smoking status: Former    Types: Cigarettes    Quit date: 10/18/2005    Years since quitting: 16.8   Smokeless tobacco: Never  Vaping Use   Vaping Use: Never used  Substance and Sexual Activity   Alcohol use: Not Currently    Comment: quit 19 years ago (2005)   Drug use: No   Sexual activity: Yes    Partners: Female  Other Topics Concern   Not on file  Social History Narrative   Caffiene 2 cups daily   Working Designer, [employer]ell[employer].     Exercises treadmill 6 days / week, 7th day bicycles.   Social Determinants of Health   Financial Resource Strain: Low Risk  (06/04/2017)   Overall Financial Resource Strain (CARDIA)    Difficulty of Paying Living Expenses: Not hard at all  Food Insecurity: No Food Insecurity (06/04/2017)   Hunger Vital Sign    Worried About Running Out of Food in the Last Year: Never true    Ran Out of Food in the Last Year: Never true  Transportation Needs: Not on file  Physical Activity: Not on file  Stress: Not on file  Social Connections: Not on file    His Allergies Are:  Allergies  Allergen Reactions   Codeine Itching  :   His Current Medications Are:  Outpatient Encounter Medications as of 08/03/2022  Medication Sig   allopurinol (ZYLOPRIM) 300 MG tablet Take 1 tablet (300 mg total) by mouth daily.   amLODipine (NORVASC) 10 MG tablet Take 1 tablet (10 mg total) by mouth daily.   indomethacin (INDOCIN) 50 MG capsule May take 1 daily with food as needed for pain pends.   irbesartan (AVAPRO) 300 MG tablet Take 300 mg by mouth daily.   rosuvastatin (CRESTOR) 20 MG tablet Take 1 tablet (20 mg total) by mouth every other day.   No facility-administered encounter medications on file as of 08/03/2022.  :   Review of Systems:  Out of a complete 14 point review of systems, all are reviewed and negative with the exception of these symptoms as listed below:  Review of Systems  Neurological:        Sleep Consult.  Snoring, fragmented sleep, wakes gasping,  has never had sleep study.  ES 10  FSS 28.     Objective:  Neurological Exam  Physical Exam Physical Examination:   Vitals:   08/03/22 1100  BP: 136/78  Pulse: (!) 52   General Examination: The patient is a very pleasant 67 y.o. male in no acute distress. He appears well-developed and well-nourished and well groomed.   HEENT: Normocephalic, atraumatic, pupils are equal, round and reactive to light, extraocular tracking is good  without limitation to gaze excursion or nystagmus noted. Hearing is grossly intact. Face is symmetric with normal facial animation. Speech is clear with no dysarthria noted. There is no hypophonia. There is no lip, neck/head, jaw or voice tremor. Neck is supple with full range of passive and active motion. There  are no carotid bruits on auscultation. Oropharynx exam reveals: No significant mouth dryness, adequate dental hygiene with partial plates on the bottom, minimal overbite, tonsils on the small side, small airway entry and longer uvula noted, Mallampati class III.  Neck circumference 17 inches.  Tongue protrudes centrally and palate elevates symmetrically.   Chest: Clear to auscultation without wheezing, rhonchi or crackles noted.  Heart: S1+S2+0, regular and normal without murmurs, rubs or gallops noted.   Abdomen: Soft, non-tender and non-distended.  Extremities: There is no obvious edema in the distal lower extremities bilaterally.   Skin: Warm and dry without trophic changes noted.   Musculoskeletal: exam reveals no obvious joint deformities.   Neurologically:  Mental status: The patient is awake, alert and oriented in all 4 spheres. His immediate and remote memory, attention, language skills and fund of knowledge are appropriate. There is no evidence of aphasia, agnosia, apraxia or anomia. Speech is clear with normal prosody and enunciation. Thought process is linear. Mood is normal and affect is normal.  Cranial nerves II - XII are as described above under HEENT exam.  Motor exam: Normal bulk, strength and tone is noted. There is no obvious action or resting tremor.  Fine motor skills and coordination: grossly intact.  Cerebellar testing: No dysmetria or intention tremor. There is no truncal or gait ataxia.  Sensory exam: intact to light touch in the upper and lower extremities.  Gait, station and balance: He stands easily. No veering to one side is noted. No leaning to one side is  noted. Posture is age-appropriate and stance is narrow based. Gait shows normal stride length and normal pace. No problems turning are noted.   Assessment and Plan:  In summary, HOBIE KOHLES is a very pleasant 67 y.o.-year old male with an underlying medical history of hypertension, gout, hyperlipidemia, sinus tachycardia and overweight state, whose history and physical exam are concerning for sleep disordered breathing, particularly obstructive sleep apnea. A laboratory attended sleep study is typically considered "gold standard" for evaluation of sleep disordered breathing.   I had a long chat with the patient about my findings and the diagnosis of sleep apnea, particularly OSA, its prognosis and treatment options. We talked about medical/conservative treatments, surgical interventions and non-pharmacological approaches for symptom control. I explained, in particular, the risks and ramifications of untreated moderate to severe OSA, especially with respect to developing cardiovascular disease down the road, including congestive heart failure (CHF), difficult to treat hypertension, cardiac arrhythmias (particularly A-fib), neurovascular complications including TIA, stroke and dementia. Even type 2 diabetes has, in part, been linked to untreated OSA. Symptoms of untreated OSA may include (but may not be limited to) daytime sleepiness, nocturia (i.e. frequent nighttime urination), memory problems, mood irritability and suboptimally controlled or worsening mood disorder such as depression and/or anxiety, lack of energy, lack of motivation, physical discomfort, as well as recurrent headaches, especially morning or nocturnal headaches. We talked about the importance of maintaining a healthy lifestyle and striving for healthy weight.  I recommended a sleep study at this time. I outlined the differences between a laboratory attended sleep study which is considered more comprehensive and accurate over the option  of a home sleep test (HST); the latter may lead to underestimation of sleep disordered breathing in some instances and does not help with diagnosing upper airway resistance syndrome and is not accurate enough to diagnose primary central sleep apnea typically. I outlined possible surgical and non-surgical treatment options of OSA, including the use of a positive  airway pressure (PAP) device (i.e. CPAP, AutoPAP/APAP or BiPAP in certain circumstances), a custom-made dental device (aka oral appliance, which would require a referral to a specialist dentist or orthodontist typically, and is generally speaking not considered for patients with full dentures or edentulous state), upper airway surgical options, such as traditional UPPP (which is not considered a first-line treatment) or the Inspire device (hypoglossal nerve stimulator, which would involve a referral for consultation with an ENT surgeon, after careful selection, following inclusion criteria - also not first-line treatment). I explained the PAP treatment option to the patient in detail, as this is generally considered first-line treatment.  The patient indicated that he would be willing to try PAP therapy, if the need arises. I explained the importance of being compliant with PAP treatment, not only for insurance purposes but primarily to improve patient's symptoms symptoms, and for the patient's long term health benefit, including to reduce His cardiovascular risks longer-term.    We will pick up our discussion about the next steps and treatment options after testing.  We will keep him posted as to the test results by phone call and/or MyChart messaging where possible.  We will plan to follow-up in sleep clinic accordingly as well.  I answered all his questions today and the patient was in agreement.   I encouraged him to call with any interim questions, concerns, problems or updates or email [place] through [place].  Generally speaking, sleep test  authorizations may take up to 2 weeks, sometimes less, sometimes longer, the patient is encouraged to get in touch with [place] if they do not hear back from the sleep lab staff directly within the next 2 weeks.  Thank you very much for allowing me to participate in the care of this nice patient. If I can be of any further assistance to you please do not hesitate to call me at [phone].  Sincerely,   Star Age, MD, PhD

## 2022-08-04 ENCOUNTER — Ambulatory Visit (HOSPITAL_COMMUNITY)
Admission: RE | Admit: 2022-08-04 | Discharge: 2022-08-04 | Disposition: A | Discharge status: Home / Self Care | Payer: 59 | Source: Ambulatory Visit | Attending: Cardiology | Admitting: Cardiology

## 2022-08-04 DIAGNOSIS — I6529 Occlusion and stenosis of unspecified carotid artery: Secondary | ICD-10-CM | POA: Diagnosis not present

## 2022-08-07 ENCOUNTER — Other Ambulatory Visit: Payer: Self-pay | Admitting: *Deleted

## 2022-08-07 MED ORDER — ASPIRIN 81 MG PO TBEC: 81.0000 mg | DELAYED_RELEASE_TABLET | Freq: Every day | ORAL | 3 refills | Status: AC

## 2022-08-18 ENCOUNTER — Ambulatory Visit (HOSPITAL_COMMUNITY): Payer: 59 | Attending: Cardiology

## 2022-08-18 DIAGNOSIS — R002 Palpitations: Secondary | ICD-10-CM

## 2022-08-18 DIAGNOSIS — I659 Occlusion and stenosis of unspecified precerebral artery: Secondary | ICD-10-CM

## 2022-08-18 LAB — ECHOCARDIOGRAM COMPLETE
AR max vel: 2.89 cm2
AV Area VTI: 2.73 cm2
AV Area mean vel: 2.84 cm2
AV Mean grad: 5 mmHg
AV Peak grad: 10.2 mmHg
Ao pk vel: 1.6 m/s
Area-P 1/2: 3.36 cm2
S' Lateral: 3.1 cm

## 2022-08-22 ENCOUNTER — Ambulatory Visit: Payer: 59 | Admitting: Cardiology

## 2022-08-23 ENCOUNTER — Telehealth: Payer: Self-pay | Admitting: Neurology

## 2022-08-23 NOTE — Telephone Encounter (Signed)
Sent mychart message

## 2022-08-28 ENCOUNTER — Telehealth: Payer: Self-pay | Admitting: *Deleted

## 2022-08-28 DIAGNOSIS — I455 Other specified heart block: Secondary | ICD-10-CM

## 2022-08-28 DIAGNOSIS — I471 Supraventricular tachycardia, unspecified: Secondary | ICD-10-CM

## 2022-08-28 NOTE — Telephone Encounter (Signed)
Spoke with pt, aware of results. Referral placed.

## 2022-08-28 NOTE — Telephone Encounter (Signed)
-----   Message from Donato Heinz, MD sent at 08/27/2022  4:19 PM EST ----- 3-second pause noted, occurred during day.  Recommend EP referral

## 2022-09-25 ENCOUNTER — Encounter (INDEPENDENT_AMBULATORY_CARE_PROVIDER_SITE_OTHER): Payer: BC Managed Care – PPO | Admitting: Ophthalmology

## 2022-10-06 ENCOUNTER — Encounter: Payer: Self-pay | Admitting: Family Medicine

## 2022-10-06 ENCOUNTER — Ambulatory Visit (INDEPENDENT_AMBULATORY_CARE_PROVIDER_SITE_OTHER): Payer: Managed Care, Other (non HMO) | Admitting: Family Medicine

## 2022-10-06 VITALS — BP 142/74 | HR 74 | Temp 98.5°F | Ht 70.0 in | Wt 198.6 lb

## 2022-10-06 DIAGNOSIS — M7041 Prepatellar bursitis, right knee: Secondary | ICD-10-CM | POA: Diagnosis not present

## 2022-10-06 NOTE — Progress Notes (Signed)
Established Patient Office Visit   Subjective:  Patient ID: Eric Salas, male    DOB: [birth year]  Age: 67 y.o. MRN: [id number]  Chief Complaint  Patient presents with   Cyst    Hard knot on right knee little pain that come and go, check dark spot on right side of face, bump on right upper arm.     HPI Encounter Diagnoses  Name Primary?   Prepatellar bursitis, right knee Yes   Persistent stable swelling right anterior knee.  No specific injury.  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  Has done a lot of bending and stooping and work on his knees over the years.  It is not painful.   Review of Systems  Constitutional: Negative.   HENT: Negative.    Eyes:  Negative for blurred vision, discharge and redness.  Respiratory: Negative.    Cardiovascular: Negative.   Gastrointestinal:  Negative for abdominal pain.  Genitourinary: Negative.   Musculoskeletal: Negative.  Negative for joint pain and myalgias.  Skin:  Negative for rash.  Neurological:  Negative for tingling, loss of consciousness and weakness.  Endo/Heme/Allergies:  Negative for polydipsia.     Current Outpatient Medications:    allopurinol (ZYLOPRIM) 300 MG tablet, Take 1 tablet (300 mg total) by mouth daily., Disp: 90 tablet, Rfl: 3   amLODipine (NORVASC) 10 MG tablet, Take 1 tablet (10 mg total) by mouth daily., Disp: 90 tablet, Rfl: 1   aspirin EC 81 MG tablet, Take 1 tablet (81 mg total) by mouth daily. Swallow whole., Disp: 90 tablet, Rfl: 3   indomethacin (INDOCIN) 50 MG capsule, May take 1 daily with food as needed for pain pends., Disp: 90 capsule, Rfl: 1   irbesartan (AVAPRO) 300 MG tablet, Take 300 mg by mouth daily., Disp: , Rfl:    rosuvastatin (CRESTOR) 20 MG tablet, Take 1 tablet (20 mg total) by mouth every other day., Disp: 90 tablet, Rfl: 2   Objective:     BP (!) 142/74 (BP Location: Right Arm, Patient Position: Sitting, Cuff Size: Normal)   Pulse 74   Temp 98.5 F (36.9 C)  (Temporal)   Ht 5\' 10"  (1.778 m)   Wt 198 lb 9.6 oz (90.1 kg)   SpO2 97%   BMI 28.50 kg/m  BP Readings from Last 3 Encounters:  10/06/22 (!) 142/74  08/03/22 136/78  07/26/22 130/78   Wt Readings from Last 3 Encounters:  10/06/22 198 lb 9.6 oz (90.1 kg)  08/03/22 206 lb 9.6 oz (93.7 kg)  07/26/22 206 lb 3.2 oz (93.5 kg)      Physical Exam Constitutional:      General: He is not in acute distress.    Appearance: Normal appearance. He is not ill-appearing, toxic-appearing or diaphoretic.  HENT:     Head: Normocephalic and atraumatic.     Right Ear: External ear normal.     Left Ear: External ear normal.  Eyes:     General: No scleral icterus.       Right eye: No discharge.        Left eye: No discharge.     Extraocular Movements: Extraocular movements intact.     Conjunctiva/sclera: Conjunctivae normal.  Pulmonary:     Effort: Pulmonary effort is normal. No respiratory distress.  Skin:    General: Skin is warm and dry.  Neurological:     Mental Status: He is alert and oriented to person, place, and time.  Psychiatric:  Mood and Affect: Mood normal.        Behavior: Behavior normal.      No results found for any visits on 10/06/22.    The 10-year ASCVD risk score (Arnett DK, et al., 2019) is: 16.7%    Assessment & Plan:   Prepatellar bursitis, right knee -     Ambulatory referral to Orthopedic Surgery    Return if symptoms worsen or fail to improve.    Libby Maw, MD

## 2022-10-13 ENCOUNTER — Ambulatory Visit: Payer: Managed Care, Other (non HMO) | Attending: Internal Medicine | Admitting: Internal Medicine

## 2022-10-13 ENCOUNTER — Encounter: Payer: Self-pay | Admitting: Internal Medicine

## 2022-10-13 VITALS — BP 132/76 | HR 56 | Ht 70.0 in | Wt 204.8 lb

## 2022-10-13 DIAGNOSIS — I10 Essential (primary) hypertension: Secondary | ICD-10-CM | POA: Diagnosis not present

## 2022-10-13 DIAGNOSIS — R002 Palpitations: Secondary | ICD-10-CM

## 2022-10-13 NOTE — Progress Notes (Signed)
HPI Eric Salas is referred by Dr. Gardiner Rhyme for evaluation of pauses. He is a pleasant 67 yo man with palpitations. He wore a cardiac monitor which demonstrated a single 3 second daytime pause. He was asymptomatic. He also has PAC's and PVC's. He has not had syncope. He has been placed on a beta blocker due to HTN and palpitations. 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 He denies chest pain or sob.  Allergies  Allergen Reactions   Codeine Itching     Current Outpatient Medications  Medication Sig Dispense Refill   allopurinol (ZYLOPRIM) 300 MG tablet Take 1 tablet (300 mg total) by mouth daily. 90 tablet 3   amLODipine (NORVASC) 10 MG tablet Take 1 tablet (10 mg total) by mouth daily. 90 tablet 1   aspirin EC 81 MG tablet Take 1 tablet (81 mg total) by mouth daily. Swallow whole. 90 tablet 3   indomethacin (INDOCIN) 50 MG capsule May take 1 daily with food as needed for pain pends. 90 capsule 1   irbesartan (AVAPRO) 300 MG tablet Take 300 mg by mouth daily.     rosuvastatin (CRESTOR) 20 MG tablet Take 1 tablet (20 mg total) by mouth every other day. 90 tablet 2   No current facility-administered medications for this visit.     Past Medical History:  Diagnosis Date   Arthritis    Cataract    lt. eye beginning stage   Hyperlipidemia    Hypertension     ROS:   All systems reviewed and negative except as noted in the HPI.   Past Surgical History:  Procedure Laterality Date   COLONOSCOPY       Family History  Problem Relation Age of Onset   Cancer Mother    23 / [place] Mother    Alcohol abuse Father    Diabetes Father    Heart attack Father    Heart disease Father    High Cholesterol Father    High blood pressure Father    Stroke Father    Diabetes Brother    Hearing loss Brother    High Cholesterol Brother    Colon cancer Brother    Cancer Brother    Diabetes Brother    Drug abuse Brother    High Cholesterol  Brother    Stomach cancer Brother    Rectal cancer Neg Hx    Esophageal cancer Neg Hx    Pancreatic cancer Neg Hx    Liver cancer Neg Hx    Prostate cancer Neg Hx      Social History   Socioeconomic History   Marital status: Married    Spouse name: Not on file   Number of children: Not on file   Years of education: Not on file   Highest education level: Not on file  Occupational History   Not on file  Tobacco Use   Smoking status: Former    Types: Cigarettes    Quit date: 10/18/2005    Years since quitting: 16.9   Smokeless tobacco: Never  Vaping Use   Vaping Use: Never used  Substance and Sexual Activity   Alcohol use: Not Currently    Comment: quit 19 years ago (2005)   Drug use: No   Sexual activity: Yes    Partners: Female  Other Topics Concern   Not on file  Social History Narrative   Caffiene 2 cups daily   Working Designer, jewellery.  Exercises treadmill 6 days / week, 7th day bicycles.   Social Determinants of Health   Financial Resource Strain: Low Risk  (06/04/2017)   Overall Financial Resource Strain (CARDIA)    Difficulty of Paying Living Expenses: Not hard at all  Food Insecurity: No Food Insecurity (06/04/2017)   Hunger Vital Sign    Worried About Running Out of Food in the Last Year: Never true    Ran Out of Food in the Last Year: Never true  Transportation Needs: Not on file  Physical Activity: Not on file  Stress: Not on file  Social Connections: Not on file  Intimate Partner Violence: Not on file     BP 132/76   Pulse (!) 56   Ht 5\' 10"  (1.778 m)   Wt 204 lb 12.8 oz (92.9 kg)   SpO2 99%   BMI 29.39 kg/m   Physical Exam:  Well appearing NAD HEENT: Unremarkable Neck:  No JVD, no thyromegally Lymphatics:  No adenopathy Back:  No CVA tenderness Lungs:  Clear with no wheezes HEART:  Regular rate rhythm, no murmurs, no rubs, no clicks Abd:  soft, positive bowel sounds, no organomegally, no rebound, no guarding Ext:  2  plus pulses, no edema, no cyanosis, no clubbing Skin:  No rashes no nodules Neuro:  CN II through XII intact, motor grossly intact  EKG - nsr  Assess/Plan: Brief transient heart block - the patient had a 3 second daytime pause associated with heart block for which he was asymptomatic. As he feels well, I have recommended watchful waiting. No indication for PPM at this time. I discussed the symptoms he might experience if his heart starts going too slow or pauses. Avoid AV nodal blocking drugs. Palpitations - the etiology is unclear. He notes HR's in the 110-120 range at times.  HTN - continue current meds.  Carleene Overlie Eric Fentress,MD

## 2022-10-13 NOTE — Patient Instructions (Addendum)
Medication Instructions:  Your physician recommends that you continue on your current medications as directed. Please refer to the Current Medication list given to you today.  *If you need a refill on your cardiac medications before your next appointment, please call your pharmacy*  Lab Work: None ordered.  If you have labs (blood work) drawn today and your tests are completely normal, you will receive your results only by: MyChart Message (if you have MyChart) OR A paper copy in the mail If you have any lab test that is abnormal or we need to change your treatment, we will call you to review the results.  Testing/Procedures: None ordered.  Follow-Up: At [facility], you and your health needs are our priority.  As part of our continuing mission to provide you with exceptional heart care, we have created designated Provider Care Teams.  These Care Teams include your primary Cardiologist (physician) and Advanced Practice Providers (APPs -  Physician Assistants and Nurse Practitioners) who all work together to provide you with the care you need, when you need it.  We recommend signing up for the patient portal called "MyChart".  Sign up information is provided on this After Visit Summary.  MyChart is used to connect with patients for Virtual Visits (Telemedicine).  Patients are able to view lab/test results, encounter notes, upcoming appointments, etc.  Non-urgent messages can be sent to your provider as well.   To learn more about what you can do with MyChart, go to [email or web address].    Your next appointment:   AS NEEDED  The format for your next appointment:   In Person  Provider:   Gregg Taylor, MD{or one of the following Advanced Practice Providers on your designated Care Team:   Renee Ursuy, PA-C Michael "Andy" Tillery, PA-C          

## 2022-10-17 ENCOUNTER — Encounter: Payer: Self-pay | Admitting: Gastroenterology

## 2022-10-19 ENCOUNTER — Other Ambulatory Visit: Payer: Self-pay | Admitting: Family Medicine

## 2022-10-25 NOTE — Progress Notes (Signed)
Cardiology Office Note:    Date:  11/03/2022   ID:  Eric Salas, DOB 1955-11-09, MRN [id number]  PCP:  [name], MD  Cardiologist:  None  Electrophysiologist:  None   Referring MD: [name],*   Chief Complaint  Patient presents with   Palpitations    History of Present Illness:    Eric Salas is a 67 y.o. male with a hx of hypertension, hyperlipidemia who presents for follow-up.  He was referred by Rodman Pickle, NP for evaluation of palpitations, initially seen 07/26/2022.  He reports that he is very active, works out twice daily 4 days/week and once daily for another 2 days/week.  States that recently he checked his heart rate at the gym and it was up to 120 bpm before he had started exercising.  He started feeling palpitations like his heart was racing.  Has happened 5 times since then.  Typically palpitations and tachycardia will last 30 to 60 minutes.  He denies any exertional symptoms.  Denies any chest pain, dyspnea, lightheadedness, syncope, lower extremity edema.  He smoked 2 packs/day for 25 years, quit in 2007.  Family history includes father had MI in 12s.  Reports he was told he had carotid stenosis on a ultrasound 15 years ago.  Zio patch x 14 days on 07/26/2022 showed 1 sinus pause occurred lasting 3 seconds, 10 episodes of SVT with longest lasting 11 beats.  Echocardiogram 08/18/2022 showed normal biventricular function, no significant valvular disease.  Carotid duplex on 08/04/2022 showed 1 to 39% bilateral carotid stenosis.  Since last clinic visit, he reports he is doing okay.  Planning right knee bursectomy on 11/23/2022.  Reports had recent episode of palpitations, lasted for about an hour, heart rate was 112.  Denies any chest pain, dyspnea, lightheadedness, syncope.  Goes to gym 5-6 days per week, walks 3 miles on treadmill.  Denies any exertional symptoms.    Past Medical History:  Diagnosis Date   Arthritis    Cataract     lt. eye beginning stage   Hyperlipidemia    Hypertension     Past Surgical History:  Procedure Laterality Date   COLONOSCOPY      Current Medications: Current Meds  Medication Sig   allopurinol (ZYLOPRIM) 300 MG tablet Take 1 tablet (300 mg total) by mouth daily.   amLODipine (NORVASC) 10 MG tablet Take 1 tablet (10 mg total) by mouth daily.   aspirin EC 81 MG tablet Take 1 tablet (81 mg total) by mouth daily. Swallow whole.   indomethacin (INDOCIN) 50 MG capsule May take 1 daily with food as needed for pain pends.   irbesartan (AVAPRO) 300 MG tablet Take 1 tablet by mouth once daily   rosuvastatin (CRESTOR) 20 MG tablet Take 1 tablet (20 mg total) by mouth every other day.     Allergies:   Codeine   Social History   Socioeconomic History   Marital status: Married    Spouse name: Not on file   Number of children: Not on file   Years of education: Not on file   Highest education level: Not on file  Occupational History   Not on file  Tobacco Use   Smoking status: Former    Types: Cigarettes    Quit date: 10/18/2005    Years since quitting: 17.0   Smokeless tobacco: Never  Vaping Use   Vaping Use: Never used  Substance and Sexual Activity   Alcohol use: Not Currently  Comment: quit 19 years ago (2005)   Drug use: No   Sexual activity: Yes    Partners: Female  Other Topics Concern   Not on file  Social History Narrative   Caffiene 2 cups daily   Working Quarry manager.    Exercises treadmill 6 days / week, 7th day bicycles.   Social Determinants of Health   Financial Resource Strain: Low Risk  (06/04/2017)   Overall Financial Resource Strain (CARDIA)    Difficulty of Paying Living Expenses: Not hard at all  Food Insecurity: No Food Insecurity (06/04/2017)   Hunger Vital Sign    Worried About Running Out of Food in the Last Year: Never true    Ran Out of Food in the Last Year: Never true  Transportation Needs: Not on file  Physical Activity:  Not on file  Stress: Not on file  Social Connections: Not on file     Family History: The patient's family history includes Alcohol abuse in his father; Cancer in his brother and mother; Colon cancer in his brother; Diabetes in his brother, brother, and father; Drug abuse in his brother; Hearing loss in his brother; Heart attack in his father; Heart disease in his father; High Cholesterol in his brother, brother, and father; High blood pressure in his father; Miscarriages / Stillbirths in his mother; Stomach cancer in his brother; Stroke in his father. There is no history of Rectal cancer, Esophageal cancer, Pancreatic cancer, Liver cancer, or Prostate cancer.  ROS:   Please see the history of present illness.     All other systems reviewed and are negative.  EKGs/Labs/Other Studies Reviewed:    The following studies were reviewed today:   EKG:   07/26/22: sinus bradycardia, rate 46, no ST abnormalities 11/03/22: Sinus bradycardia, rate 49, no ST abnormalities  Recent Labs: 07/14/2022: BUN 26; Creatinine, Ser 0.96; Hemoglobin 14.3; Platelets 150.0; Potassium 4.2; Sodium 140; TSH 1.11  Recent Lipid Panel    Component Value Date/Time   CHOL 151 10/07/2021 0831   TRIG 125.0 10/07/2021 0831   HDL 38.60 (L) 10/07/2021 0831   CHOLHDL 4 10/07/2021 0831   VLDL 25.0 10/07/2021 0831   LDLCALC 88 10/07/2021 0831   LDLDIRECT 82.0 02/03/2022 1029    Physical Exam:    VS:  BP 128/82 (BP Location: Right Arm, Patient Position: Sitting, Cuff Size: Normal)   Pulse (!) 49   Ht 5\' 9"  (1.753 m)   Wt 199 lb 3.2 oz (90.4 kg)   SpO2 100%   BMI 29.42 kg/m     Wt Readings from Last 3 Encounters:  11/03/22 199 lb 3.2 oz (90.4 kg)  10/13/22 204 lb 12.8 oz (92.9 kg)  10/06/22 198 lb 9.6 oz (90.1 kg)     GEN:  Well nourished, well developed in no acute distress HEENT: Normal NECK: No JVD; No carotid bruits LYMPHATICS: No lymphadenopathy CARDIAC: bradycardia, regular no murmurs, rubs,  gallops RESPIRATORY:  Clear to auscultation without rales, wheezing or rhonchi  ABDOMEN: Soft, non-tender, non-distended MUSCULOSKELETAL:  No edema; No deformity  SKIN: Warm and dry NEUROLOGIC:  Alert and oriented x 3 PSYCHIATRIC:  Normal affect   ASSESSMENT:    1. Sinus pause   2. Hyperlipidemia, unspecified hyperlipidemia type   3. Preop cardiovascular exam   4. Palpitations   5. Essential hypertension     PLAN:    Preop evaluation: Prior to right knee bursectomy.  Excellent functional capacity, denies any anginal symptoms.  RCRI score 0.  Overall  would classify as low risk for major adverse cardiac event and no further cardiac workup recommended prior to surgery.  Palpitations/sinus pauses: Zio patch x 14 days on 07/26/2022 showed 1 sinus pause occurred lasting 3 seconds, 10 episodes of SVT with longest lasting 11 beats.  Echocardiogram 08/18/2022 showed normal biventricular function, no significant valvular disease.  Carotid duplex on 08/04/2022 showed 1 to 39% bilateral carotid stenosis.   -Seen by Dr. Ladona Ridgel in EP for sinus pause, recommend monitoring -He reported episode of palpitations 1 month ago that lasted about an hour, none since.  Recommend Kardia mobile device for longer-term monitoring  Hypertension: BP medications listed as amlodipine 10 mg daily, clonidine 0.1 mg twice daily, irbesartan 300 mg daily.  However he reports he has been off clonidine.  Would favor avoiding clonidine given his low resting heart rate.  BP currently appears controlled on amlodipine and irbesartan.  Hyperlipidemia: On rosuvastatin 20 mg every other day.  Reports had myalgias with taking daily.  LDL 88 on 10/07/2021 -Check calcium score to guide how aggressive to be in lowering cholesterol  Snoring: has seen sleep medicine, planning sleep study  Carotid stenosis: Reports he was told he had carotid stenosis on a ultrasound 15 years ago.  Carotid duplex on 08/04/2022 showed 1 to 39% bilateral carotid  stenosis.  RTC in 6 months   Medication Adjustments/Labs and Tests Ordered: Current medicines are reviewed at length with the patient today.  Concerns regarding medicines are outlined above.  Orders Placed This Encounter  Procedures   CT CARDIAC SCORING (SELF PAY ONLY)   EKG 12-Lead   No orders of the defined types were placed in this encounter.   Patient Instructions  Medication Instructions:  Your physician recommends that you continue on your current medications as directed. Please refer to the Current Medication list given to you today.  *If you need a refill on your cardiac medications before your next appointment, please call your pharmacy*  Testing/Procedures: CT coronary calcium score.   Test locations:  [facility]   This is $99 out of pocket.   Coronary CalciumScan A coronary calcium scan is an imaging test used to look for deposits of calcium and other fatty materials (plaques) in the inner lining of the blood vessels of the heart (coronary arteries). These deposits of calcium and plaques can partly clog and narrow the coronary arteries without producing any symptoms or warning signs. This puts a person at risk for a heart attack. This test can detect these deposits before symptoms develop. Tell a health care provider about: Any allergies you have. All medicines you are taking, including vitamins, herbs, eye drops, creams, and over-the-counter medicines. Any problems you or family members have had with anesthetic medicines. Any blood disorders you have. Any surgeries you have had. Any medical conditions you have. Whether you are pregnant or may be pregnant. What are the risks? Generally, this is a safe procedure. However, problems may occur, including: Harm to a pregnant woman and her unborn baby. This test involves the use of radiation. Radiation exposure can be dangerous to a pregnant woman and her unborn baby. If you are pregnant,  you generally should not have this procedure done. Slight increase in the risk of cancer. This is because of the radiation involved in the test. What happens before the procedure? No preparation is needed for this procedure. What happens during the procedure? You will undress and remove any jewelry around your neck or chest. You will put  on a hospital gown. Sticky electrodes will be placed on your chest. The electrodes will be connected to an electrocardiogram (ECG) machine to record a tracing of the electrical activity of your heart. A CT scanner will take pictures of your heart. During this time, you will be asked to lie still and hold your breath for 2-3 seconds while a picture of your heart is being taken. The procedure may vary among health care providers and hospitals. What happens after the procedure? You can get dressed. You can return to your normal activities. It is up to you to get the results of your test. Ask your health care provider, or the department that is doing the test, when your results will be ready. Summary A coronary calcium scan is an imaging test used to look for deposits of calcium and other fatty materials (plaques) in the inner lining of the blood vessels of the heart (coronary arteries). Generally, this is a safe procedure. Tell your health care provider if you are pregnant or may be pregnant. No preparation is needed for this procedure. A CT scanner will take pictures of your heart. You can return to your normal activities after the scan is done. This information is not intended to replace advice given to you by your health care provider. Make sure you discuss any questions you have with your health care provider. Document Released: 12/30/2007 Document Revised: 05/22/2016 Document Reviewed: 05/22/2016 Elsevier Interactive Patient Education  2017 [employer]    Follow-Up: At [facility], you and your health needs are our priority.  As part of our  continuing mission to provide you with exceptional heart care, we have created designated Provider Care Teams.  These Care Teams include your primary Cardiologist (physician) and Advanced Practice Providers (APPs -  Physician Assistants and Nurse Practitioners) who all work together to provide you with the care you need, when you need it.  We recommend signing up for the patient portal called "MyChart".  Sign up information is provided on this After Visit Summary.  MyChart is used to connect with patients for Virtual Visits (Telemedicine).  Patients are able to view lab/test results, encounter notes, upcoming appointments, etc.  Non-urgent messages can be sent to your provider as well.   To learn more about what you can do with MyChart, go to [email or web address].    Your next appointment:   6 month(s)  Provider:   Dr. Bjorn PippinSchumann Other Instructions You can look into the [facility] Pa[employer]ardia Mobile device by [employer]. This device is purchased by you and it connects to an application you download to your smart phone.  It can detect abnormal heart rhythms and alert you to contact your doctor for further evaluation. The web site is:  [email or web address].alivecor.[email or web address]      Signed, Little Ishikawahristopher L Emmett Bracknell, MD  11/03/2022 8:59 AM    [facility]

## 2022-11-02 ENCOUNTER — Telehealth: Payer: Self-pay | Admitting: *Deleted

## 2022-11-02 NOTE — Telephone Encounter (Signed)
   Pre-operative Risk Assessment    Patient Name: Eric Salas  DOB: January 07, 1956 MRN: [id number]      Request for Surgical Clearance    Procedure:   RIGHT KNEE BURSECTOMY  Date of Surgery:  Clearance 11/23/22                                 Surgeon:  DR. MATTHEW OLIN Surgeon's Group or Practice Name:  [facility] Phone number:  [phone] Fax number:  [phone] ATTN: [name]   Type of Clearance Requested:   - Medical ; ASA    Type of Anesthesia:   CHOICE   Additional requests/questions:    [name]   11/02/2022, 3:45 PM

## 2022-11-03 ENCOUNTER — Encounter: Payer: Self-pay | Admitting: Cardiology

## 2022-11-03 ENCOUNTER — Ambulatory Visit: Payer: Managed Care, Other (non HMO) | Attending: Cardiology | Admitting: Cardiology

## 2022-11-03 VITALS — BP 128/82 | HR 49 | Ht 69.0 in | Wt 199.2 lb

## 2022-11-03 DIAGNOSIS — E785 Hyperlipidemia, unspecified: Secondary | ICD-10-CM | POA: Diagnosis not present

## 2022-11-03 DIAGNOSIS — I455 Other specified heart block: Secondary | ICD-10-CM

## 2022-11-03 DIAGNOSIS — I10 Essential (primary) hypertension: Secondary | ICD-10-CM

## 2022-11-03 DIAGNOSIS — Z01810 Encounter for preprocedural cardiovascular examination: Secondary | ICD-10-CM | POA: Diagnosis not present

## 2022-11-03 DIAGNOSIS — R002 Palpitations: Secondary | ICD-10-CM

## 2022-11-03 NOTE — Patient Instructions (Signed)
Medication Instructions:  Your physician recommends that you continue on your current medications as directed. Please refer to the Current Medication list given to you today.  *If you need a refill on your cardiac medications before your next appointment, please call your pharmacy*  Testing/Procedures: CT coronary calcium score.   Test locations:  [facility]   This is $99 out of pocket.   Coronary CalciumScan A coronary calcium scan is an imaging test used to look for deposits of calcium and other fatty materials (plaques) in the inner lining of the blood vessels of the heart (coronary arteries). These deposits of calcium and plaques can partly clog and narrow the coronary arteries without producing any symptoms or warning signs. This puts a person at risk for a heart attack. This test can detect these deposits before symptoms develop. Tell a health care provider about: Any allergies you have. All medicines you are taking, including vitamins, herbs, eye drops, creams, and over-the-counter medicines. Any problems you or family members have had with anesthetic medicines. Any blood disorders you have. Any surgeries you have had. Any medical conditions you have. Whether you are pregnant or may be pregnant. What are the risks? Generally, this is a safe procedure. However, problems may occur, including: Harm to a pregnant woman and her unborn baby. This test involves the use of radiation. Radiation exposure can be dangerous to a pregnant woman and her unborn baby. If you are pregnant, you generally should not have this procedure done. Slight increase in the risk of cancer. This is because of the radiation involved in the test. What happens before the procedure? No preparation is needed for this procedure. What happens during the procedure? You will undress and remove any jewelry around your neck or chest. You will put on a hospital gown. Sticky electrodes  will be placed on your chest. The electrodes will be connected to an electrocardiogram (ECG) machine to record a tracing of the electrical activity of your heart. A CT scanner will take pictures of your heart. During this time, you will be asked to lie still and hold your breath for 2-3 seconds while a picture of your heart is being taken. The procedure may vary among health care providers and hospitals. What happens after the procedure? You can get dressed. You can return to your normal activities. It is up to you to get the results of your test. Ask your health care provider, or the department that is doing the test, when your results will be ready. Summary A coronary calcium scan is an imaging test used to look for deposits of calcium and other fatty materials (plaques) in the inner lining of the blood vessels of the heart (coronary arteries). Generally, this is a safe procedure. Tell your health care provider if you are pregnant or may be pregnant. No preparation is needed for this procedure. A CT scanner will take pictures of your heart. You can return to your normal activities after the scan is done. This information is not intended to replace advice given to you by your health care provider. Make sure you discuss any questions you have with your health care provider. Document Released: 12/30/2007 Document Revised: 05/22/2016 Document Reviewed: 05/22/2016 Elsevier Interactive Patient Education  2017 [employer].    Follow-Up: At [facility], you and your health needs are our priority.  As part of our continuing mission to provide you with exceptional heart care, we have created designated Provider Care Teams.  These  Care Teams include your primary Cardiologist (physician) and Advanced Practice Providers (APPs -  Physician Assistants and Nurse Practitioners) who all work together to provide you with the care you need, when you need it.  We recommend signing up for the patient  portal called "MyChart".  Sign up information is provided on this After Visit Summary.  MyChart is used to connect with patients for Virtual Visits (Telemedicine).  Patients are able to view lab/test results, encounter notes, upcoming appointments, etc.  Non-urgent messages can be sent to your provider as well.   To learn more about what you can do with MyChart, go to [email or web address].    Your next appointment:   6 month(s)  Provider:   Dr. Bjorn Pippin Other Instructions You can look into the [facility] device by [employer]. This device is purchased by you and it connects to an application you download to your smart phone.  It can detect abnormal heart rhythms and alert you to contact your doctor for further evaluation. The web site is:  [email or web address].[email or web address]

## 2022-11-03 NOTE — Telephone Encounter (Signed)
FYI, patient has an appointment with you today 4/19.

## 2022-11-08 ENCOUNTER — Encounter: Payer: Self-pay | Admitting: Family Medicine

## 2022-11-08 ENCOUNTER — Ambulatory Visit: Payer: Managed Care, Other (non HMO) | Admitting: Family Medicine

## 2022-11-08 VITALS — BP 122/70 | HR 54 | Temp 97.3°F | Ht 69.0 in | Wt 200.6 lb

## 2022-11-08 DIAGNOSIS — E7800 Pure hypercholesterolemia, unspecified: Secondary | ICD-10-CM | POA: Diagnosis not present

## 2022-11-08 DIAGNOSIS — Z0000 Encounter for general adult medical examination without abnormal findings: Secondary | ICD-10-CM

## 2022-11-08 DIAGNOSIS — Z125 Encounter for screening for malignant neoplasm of prostate: Secondary | ICD-10-CM | POA: Diagnosis not present

## 2022-11-08 DIAGNOSIS — R7309 Other abnormal glucose: Secondary | ICD-10-CM

## 2022-11-08 DIAGNOSIS — I10 Essential (primary) hypertension: Secondary | ICD-10-CM

## 2022-11-08 DIAGNOSIS — R1013 Epigastric pain: Secondary | ICD-10-CM

## 2022-11-08 DIAGNOSIS — M109 Gout, unspecified: Secondary | ICD-10-CM

## 2022-11-08 DIAGNOSIS — K862 Cyst of pancreas: Secondary | ICD-10-CM | POA: Diagnosis not present

## 2022-11-08 DIAGNOSIS — Z23 Encounter for immunization: Secondary | ICD-10-CM

## 2022-11-08 LAB — LIPID PANEL
Cholesterol: 149 mg/dL (ref 0–200)
HDL: 56.1 mg/dL (ref >39.00)
LDL Cholesterol: 79 mg/dL (ref 0–99)
NonHDL: 92.91
Total CHOL/HDL Ratio: 3
Triglycerides: 70 mg/dL (ref 0.0–149.0)
VLDL: 14 mg/dL (ref 0.0–40.0)

## 2022-11-08 LAB — URINALYSIS, ROUTINE W REFLEX MICROSCOPIC
Bilirubin Urine: NEGATIVE
Hgb urine dipstick: NEGATIVE
Ketones, ur: NEGATIVE
Leukocytes,Ua: NEGATIVE
Nitrite: NEGATIVE
RBC / HPF: NONE SEEN (ref 0–?)
Specific Gravity, Urine: 1.025 (ref 1.000–1.030)
Total Protein, Urine: NEGATIVE
Urine Glucose: NEGATIVE
Urobilinogen, UA: 0.2 (ref 0.0–1.0)
pH: 6 (ref 5.0–8.0)

## 2022-11-08 LAB — URIC ACID: Uric Acid, Serum: 6.9 mg/dL (ref 4.0–7.8)

## 2022-11-08 LAB — HEPATIC FUNCTION PANEL
ALT: 22 U/L (ref 0–53)
AST: 26 U/L (ref 0–37)
Albumin: 4.3 g/dL (ref 3.5–5.2)
Alkaline Phosphatase: 51 U/L (ref 39–117)
Bilirubin, Direct: 0.1 mg/dL (ref 0.0–0.3)
Total Bilirubin: 0.7 mg/dL (ref 0.2–1.2)
Total Protein: 6.7 g/dL (ref 6.0–8.3)

## 2022-11-08 LAB — CBC
HCT: 42.6 % (ref 39.0–52.0)
Hemoglobin: 14.4 g/dL (ref 13.0–17.0)
MCHC: 33.8 g/dL (ref 30.0–36.0)
MCV: 93.5 fl (ref 78.0–100.0)
Platelets: 181 10*3/uL (ref 150.0–400.0)
RBC: 4.56 Mil/uL (ref 4.22–5.81)
RDW: 13.3 % (ref 11.5–15.5)
WBC: 5.3 10*3/uL (ref 4.0–10.5)

## 2022-11-08 LAB — LIPASE: Lipase: 22 U/L (ref 11.0–59.0)

## 2022-11-08 LAB — BASIC METABOLIC PANEL
BUN: 19 mg/dL (ref 6–23)
CO2: 27 mEq/L (ref 19–32)
Calcium: 9.3 mg/dL (ref 8.4–10.5)
Chloride: 105 mEq/L (ref 96–112)
Creatinine, Ser: 1 mg/dL (ref 0.40–1.50)
GFR: 78.74 mL/min (ref >60.00)
Glucose, Bld: 148 mg/dL — ABNORMAL HIGH (ref 70–99)
Potassium: 4.8 mEq/L (ref 3.5–5.1)
Sodium: 141 mEq/L (ref 135–145)

## 2022-11-08 LAB — HEMOGLOBIN A1C: Hgb A1c MFr Bld: 5.8 % (ref 4.6–6.5)

## 2022-11-08 LAB — PSA: PSA: 2.74 ng/mL (ref 0.10–4.00)

## 2022-11-08 MED ORDER — OMEPRAZOLE 20 MG PO CPDR
20.0000 mg | DELAYED_RELEASE_CAPSULE | Freq: Every day | ORAL | 3 refills | Status: DC
Start: 2022-11-08 — End: 2022-12-21

## 2022-11-08 NOTE — Progress Notes (Signed)
New Patient Office Visit  Subjective    Patient ID: Eric Salas, male    DOB: 05/19/1956  Age: 67 y.o. MRN: [id number]  CC:  Chief Complaint  Patient presents with   Medical Management of Chronic Issues    6 month follow up, right side upper abdominal pain/discomfort x 6 months. Patient fasting.     HPI Eric Salas presents to establish care. Encounter Diagnoses  Name Primary?   Elevated LDL cholesterol level Yes   Gout, unspecified cause, unspecified chronicity, unspecified site    Hypertension goal BP (blood pressure) < 140/80    Elevated glucose    Pancreatic cyst    Need for shingles vaccine    Healthcare maintenance    Dyspepsia    Here for physical and follow-up of above.  Physically active at work and has regular dental care.  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  41-month history of burning right upper quadrant pain.  Seems to be worse when he lies on his right side.  He denies increased burping or reflux symptoms.  There is no nausea or vomiting.  Denies night sweats or significant weight loss.  Denies constipation melena or blood in his stool.  History of pancreatitis a few years ago.  MRI had revealed a small pancreatic cyst.  He does not drink alcohol or use illicit drugs.  LDL cholesterol has been 88 on rosuvastatin 20 q. OD.  History of peripheral vascular disease with calcification of his arteries.  He is scheduled for coronary artery calcium scoring.  Outpatient Encounter Medications as of 11/08/2022  Medication Sig   allopurinol (ZYLOPRIM) 300 MG tablet Take 1 tablet (300 mg total) by mouth daily.   amLODipine (NORVASC) 10 MG tablet Take 1 tablet (10 mg total) by mouth daily.   aspirin EC 81 MG tablet Take 1 tablet (81 mg total) by mouth daily. Swallow whole.   indomethacin (INDOCIN) 50 MG capsule May take 1 daily with food as needed for pain pends.   irbesartan (AVAPRO) 300 MG tablet Take 1 tablet by mouth once daily    omeprazole (PRILOSEC) 20 MG capsule Take 1 capsule (20 mg total) by mouth daily.   rosuvastatin (CRESTOR) 20 MG tablet Take 1 tablet (20 mg total) by mouth every other day.   No facility-administered encounter medications on file as of 11/08/2022.    Past Medical History:  Diagnosis Date   Arthritis    Cataract    lt. eye beginning stage   Hyperlipidemia    Hypertension     Past Surgical History:  Procedure Laterality Date   COLONOSCOPY      Family History  Problem Relation Age of Onset   Cancer Mother    Miscarriages / [place] Mother    Alcohol abuse Father    Diabetes Father    Heart attack Father    Heart disease Father    High Cholesterol Father    High blood pressure Father    Stroke Father    Diabetes Brother    Hearing loss Brother    High Cholesterol Brother    Colon cancer Brother    Cancer Brother    Diabetes Brother    Drug abuse Brother    High Cholesterol Brother    Stomach cancer Brother    Rectal cancer Neg Hx    Esophageal cancer Neg Hx    Pancreatic cancer Neg Hx    Liver cancer Neg Hx  Prostate cancer Neg Hx     Social History   Socioeconomic History   Marital status: Married    Spouse name: Not on file   Number of children: Not on file   Years of education: Not on file   Highest education level: Not on file  Occupational History   Not on file  Tobacco Use   Smoking status: Former    Types: Cigarettes    Quit date: 10/18/2005    Years since quitting: 17.0   Smokeless tobacco: Never  Vaping Use   Vaping Use: Never used  Substance and Sexual Activity   Alcohol use: Not Currently    Comment: quit 19 years ago (2005)   Drug use: No   Sexual activity: Yes    Partners: Female  Other Topics Concern   Not on file  Social History Narrative   Caffiene 2 cups daily   Working Quarry manager.    Exercises treadmill 6 days / week, 7th day bicycles.   Social Determinants of Health   Financial Resource Strain: Low  Risk  (11/06/2022)   Overall Financial Resource Strain (CARDIA)    Difficulty of Paying Living Expenses: Not hard at all  Food Insecurity: No Food Insecurity (11/06/2022)   Hunger Vital Sign    Worried About Running Out of Food in the Last Year: Never true    Ran Out of Food in the Last Year: Never true  Transportation Needs: Patient Declined (11/06/2022)   PRAPARE - Transportation    Lack of Transportation (Medical): Patient declined    Lack of Transportation (Non-Medical): Patient declined  Physical Activity: Sufficiently Active (11/06/2022)   Exercise Vital Sign    Days of Exercise per Week: 5 days    Minutes of Exercise per Session: 60 min  Stress: No Stress Concern Present (11/06/2022)   [employer] of Occupational Health - Occupational Stress Questionnaire    Feeling of Stress : Not at all  Social Connections: Socially Integrated (11/06/2022)   Social Connection and Isolation Panel [NHANES]    Frequency of Communication with Friends and Family: Once a week    Frequency of Social Gatherings with Friends and Family: Three times a week    Attends Religious Services: 1 to 4 times per year    Active Member of Clubs or Organizations: Yes    Attends Banker Meetings: 1 to 4 times per year    Marital Status: Living with partner  Intimate Partner Violence: Not on file    Review of Systems  Constitutional: Negative.   HENT: Negative.    Eyes:  Negative for blurred vision, discharge and redness.  Respiratory: Negative.    Cardiovascular: Negative.   Gastrointestinal:  Positive for abdominal pain. Negative for blood in stool, constipation, diarrhea, heartburn, melena, nausea and vomiting.  Genitourinary: Negative.   Musculoskeletal: Negative.  Negative for myalgias.  Skin:  Negative for rash.  Neurological:  Negative for tingling, loss of consciousness and weakness.  Endo/Heme/Allergies:  Negative for polydipsia.         11/08/2022    8:17 AM 10/06/2022    1:07 PM  06/06/2022    1:39 PM  Depression screen PHQ 2/9  Decreased Interest 0 0 0  Down, Depressed, Hopeless 0 0 0  PHQ - 2 Score 0 0 0       Objective    BP 122/70 (BP Location: Right Arm, Patient Position: Sitting, Cuff Size: Normal)   Pulse (!) 54   Temp (!) 97.3  F (36.3 C) (Temporal)   Ht 5\' 9"  (1.753 m)   Wt 200 lb 9.6 oz (91 kg)   SpO2 99%   BMI 29.62 kg/m   Physical Exam Constitutional:      General: He is not in acute distress.    Appearance: Normal appearance. He is not ill-appearing, toxic-appearing or diaphoretic.  HENT:     Head: Normocephalic and atraumatic.     Right Ear: External ear normal.     Left Ear: Tympanic membrane and external ear normal.     Mouth/Throat:     Mouth: Mucous membranes are moist.     Pharynx: Oropharynx is clear. No oropharyngeal exudate or posterior oropharyngeal erythema.  Eyes:     General: No scleral icterus.       Right eye: No discharge.        Left eye: No discharge.     Extraocular Movements: Extraocular movements intact.     Conjunctiva/sclera: Conjunctivae normal.     Pupils: Pupils are equal, round, and reactive to light.  Cardiovascular:     Rate and Rhythm: Normal rate and regular rhythm.  Pulmonary:     Effort: Pulmonary effort is normal. No respiratory distress.     Breath sounds: Normal breath sounds.  Abdominal:     General: Bowel sounds are normal.     Tenderness: There is no abdominal tenderness. There is no guarding or rebound.     Hernia: No hernia is present.  Musculoskeletal:     Cervical back: No rigidity or tenderness.  Skin:    General: Skin is warm and dry.  Neurological:     Mental Status: He is alert and oriented to person, place, and time.  Psychiatric:        Mood and Affect: Mood normal.        Behavior: Behavior normal.         Assessment & Plan:   Elevated LDL cholesterol level -     Hepatic function panel -     Lipid panel  Gout, unspecified cause, unspecified chronicity,  unspecified site -     Uric acid  Hypertension goal BP (blood pressure) < 140/80 -     Basic metabolic panel -     CBC  Elevated glucose -     Basic metabolic panel -     Hemoglobin A1c  Pancreatic cyst -     Hepatic function panel -     Lipase -     MR ABDOMEN W WO CONTRAST; Future  Need for shingles vaccine -     Varicella-zoster vaccine IM  Healthcare maintenance -     PSA -     Urinalysis, Routine w reflex microscopic  Dyspepsia -     Omeprazole; Take 1 capsule (20 mg total) by mouth daily.  Dispense: 30 capsule; Refill: 3     Return in about 3 months (around 02/07/2023).  Continue current medications.  Pending results of LDL could consider trying rosuvastatin 10 mg daily.  May need to continue 20 mg q. OD with Warrick Parisian secondary to myalgias associated with statins.  Will try omeprazole for 3 months with dyspepsia and midepigastric pain.  MRI is ordered to follow-up on the pancreatic cyst seen in a prior scan.  Second Shingrix today.  Rechecking hemoglobin A1c.  [name], MD

## 2022-11-13 ENCOUNTER — Telehealth: Payer: Self-pay | Admitting: Family Medicine

## 2022-11-13 NOTE — Telephone Encounter (Signed)
Referral was made on 4/24 and an appt scheduled. His insurance called to tell him if he has that done at [facility] on [address] they will cover more of the bill. He would like to transfer his referral there.

## 2022-11-14 NOTE — Telephone Encounter (Signed)
Please advise message below phone to [facility] [phone]. Please let me know if I need to do anything on my end. Thanks a bunch.

## 2022-11-17 NOTE — Telephone Encounter (Signed)
Order signed and faxed to [facility]

## 2022-11-20 ENCOUNTER — Other Ambulatory Visit: Payer: Self-pay | Admitting: Family

## 2022-11-20 ENCOUNTER — Ambulatory Visit (INDEPENDENT_AMBULATORY_CARE_PROVIDER_SITE_OTHER): Payer: Worker's Compensation

## 2022-11-20 DIAGNOSIS — M25511 Pain in right shoulder: Secondary | ICD-10-CM

## 2022-11-20 DIAGNOSIS — W19XXXA Unspecified fall, initial encounter: Secondary | ICD-10-CM

## 2022-11-24 ENCOUNTER — Encounter: Payer: Self-pay | Admitting: Family Medicine

## 2022-11-24 ENCOUNTER — Ambulatory Visit: Payer: Managed Care, Other (non HMO) | Admitting: Family Medicine

## 2022-11-24 VITALS — BP 115/68 | HR 60 | Temp 98.8°F | Ht 69.0 in | Wt 200.4 lb

## 2022-11-24 DIAGNOSIS — M25511 Pain in right shoulder: Secondary | ICD-10-CM | POA: Diagnosis not present

## 2022-11-24 DIAGNOSIS — F40240 Claustrophobia: Secondary | ICD-10-CM | POA: Diagnosis not present

## 2022-11-24 DIAGNOSIS — R7303 Prediabetes: Secondary | ICD-10-CM | POA: Insufficient documentation

## 2022-11-24 MED ORDER — METFORMIN HCL ER 500 MG PO TB24
500.0000 mg | ORAL_TABLET | Freq: Every evening | ORAL | 1 refills | Status: DC
Start: 2022-11-24 — End: 2023-02-20

## 2022-11-24 MED ORDER — ALPRAZOLAM 0.5 MG PO TABS
ORAL_TABLET | ORAL | 0 refills | Status: DC
Start: 2022-11-24 — End: 2023-11-27

## 2022-11-24 MED ORDER — TRAMADOL HCL 50 MG PO TABS
ORAL_TABLET | ORAL | 0 refills | Status: DC
Start: 2022-11-24 — End: 2023-11-27

## 2022-11-24 NOTE — Progress Notes (Signed)
Established Patient Office Visit   Subjective:  Patient ID: Eric Salas, male    DOB: Apr 17, 1956  Age: 67 y.o. MRN: [id number]  Chief Complaint  Patient presents with   Fall    Fall at work hurt right shoulder. Discuss lab results.     Fall Pertinent negatives include no abdominal pain, loss of consciousness or tingling.   Encounter Diagnoses  Name Primary?   Acute pain of right shoulder Yes   Claustrophobia    Prediabetes    Larey Seat a few weeks ago at work and injured his right shoulder.  He is currently in a sling.  Light duty at work.  Has difficulty sleeping at night.  Scheduled for an MRI of his abdomen.  He is severely claustrophobic.  He has been compliant with his allopurinol but admits to increased protein in his diet.  Positive family history of diabetes.  We discussed his ongoing elevated A1c.   Review of Systems  Constitutional: Negative.   HENT: Negative.    Eyes:  Negative for blurred vision, discharge and redness.  Respiratory: Negative.    Cardiovascular: Negative.   Gastrointestinal:  Negative for abdominal pain.  Genitourinary: Negative.   Musculoskeletal:  Positive for joint pain. Negative for myalgias.  Skin:  Negative for rash.  Neurological:  Negative for tingling, loss of consciousness and weakness.  Endo/Heme/Allergies:  Negative for polydipsia.     Current Outpatient Medications:    allopurinol (ZYLOPRIM) 300 MG tablet, Take 1 tablet (300 mg total) by mouth daily., Disp: 90 tablet, Rfl: 3   ALPRAZolam (XANAX) 0.5 MG tablet, Take 45 minutes before procedure., Disp: 1 tablet, Rfl: 0   amLODipine (NORVASC) 10 MG tablet, Take 1 tablet (10 mg total) by mouth daily., Disp: 90 tablet, Rfl: 1   aspirin EC 81 MG tablet, Take 1 tablet (81 mg total) by mouth daily. Swallow whole., Disp: 90 tablet, Rfl: 3   indomethacin (INDOCIN) 50 MG capsule, May take 1 daily with food as needed for pain pends., Disp: 90 capsule, Rfl: 1   irbesartan (AVAPRO) 300 MG  tablet, Take 1 tablet by mouth once daily, Disp: 90 tablet, Rfl: 0   metFORMIN (GLUCOPHAGE-XR) 500 MG 24 hr tablet, Take 1 tablet (500 mg total) by mouth at bedtime., Disp: 90 tablet, Rfl: 1   omeprazole (PRILOSEC) 20 MG capsule, Take 1 capsule (20 mg total) by mouth daily., Disp: 30 capsule, Rfl: 3   rosuvastatin (CRESTOR) 20 MG tablet, Take 1 tablet (20 mg total) by mouth every other day., Disp: 90 tablet, Rfl: 2   traMADol (ULTRAM) 50 MG tablet, Take 1 to 2 tablets nightly before bed as needed for pain., Disp: 30 tablet, Rfl: 0   Objective:     BP 115/68 (BP Location: Left Arm, Patient Position: Sitting, Cuff Size: Normal)   Pulse 60   Temp 98.8 F (37.1 C) (Temporal)   Ht 5\' 9"  (1.753 m)   Wt 200 lb 6.4 oz (90.9 kg)   SpO2 97%   BMI 29.59 kg/m    Physical Exam Constitutional:      General: He is not in acute distress.    Appearance: Normal appearance. He is not ill-appearing, toxic-appearing or diaphoretic.  HENT:     Head: Normocephalic and atraumatic.     Right Ear: External ear normal.     Left Ear: External ear normal.  Eyes:     General: No scleral icterus.       Right eye: No discharge.  Left eye: No discharge.     Extraocular Movements: Extraocular movements intact.     Conjunctiva/sclera: Conjunctivae normal.  Pulmonary:     Effort: Pulmonary effort is normal. No respiratory distress.  Musculoskeletal:     Comments: Right shoulder sling  Skin:    General: Skin is warm and dry.  Neurological:     Mental Status: He is alert and oriented to person, place, and time.  Psychiatric:        Mood and Affect: Mood normal.        Behavior: Behavior normal.      No results found for any visits on 11/24/22.    The 10-year ASCVD risk score (Arnett DK, et al., 2019) is: 9.4%    Assessment & Plan:   Acute pain of right shoulder -     traMADol HCl; Take 1 to 2 tablets nightly before bed as needed for pain.  Dispense: 30 tablet; Refill:  0  Claustrophobia -     ALPRAZolam; Take 45 minutes before procedure.  Dispense: 1 tablet; Refill: 0  Prediabetes -     metFORMIN HCl ER; Take 1 tablet (500 mg total) by mouth at bedtime.  Dispense: 90 tablet; Refill: 1    Return Follow-up after MRI of abdomen.Marland Kitchen  Ultram at night.  Suggested he may want to sleep in his recliner.  Brief course of Ultram until he sees orthopedics for his shoulder.  On Xanax prior to his upcoming MRI.  His wife will be taking him to the procedure.  [name], MD

## 2022-12-01 ENCOUNTER — Ambulatory Visit (HOSPITAL_BASED_OUTPATIENT_CLINIC_OR_DEPARTMENT_OTHER)
Admission: RE | Admit: 2022-12-01 | Discharge: 2022-12-01 | Disposition: A | Discharge status: Home / Self Care | Payer: Self-pay | Source: Ambulatory Visit | Attending: Cardiology | Admitting: Cardiology

## 2022-12-01 DIAGNOSIS — E785 Hyperlipidemia, unspecified: Secondary | ICD-10-CM | POA: Insufficient documentation

## 2022-12-06 ENCOUNTER — Other Ambulatory Visit: Payer: Self-pay | Admitting: *Deleted

## 2022-12-06 MED ORDER — EZETIMIBE 10 MG PO TABS: 10.0000 mg | ORAL_TABLET | Freq: Every day | ORAL | 3 refills | Status: DC

## 2022-12-14 NOTE — Progress Notes (Signed)
Cardiology Office Note:    Date:  12/15/2022   ID:  Eric Salas, DOB [birth year], MRN [id number]  PCP:  [name], MD  Cardiologist:  None  Electrophysiologist:  None   Referring MD: [name],*   Chief Complaint  Patient presents with   Coronary Artery Disease    History of Present Illness:    Eric Salas is a 67 y.o. male with a hx of hypertension, hyperlipidemia who presents for follow-up.  He was referred by Rodman Pickle, NP for evaluation of palpitations, initially seen 07/26/2022.  He reports that he is very active, works out twice daily 4 days/week and once daily for another 2 days/week.  States that recently he checked his heart rate at the gym and it was up to 120 bpm before he had started exercising.  He started feeling palpitations like his heart was racing.  Has happened 5 times since then.  Typically palpitations and tachycardia will last 30 to 60 minutes.  He denies any exertional symptoms.  Denies any chest pain, dyspnea, lightheadedness, syncope, lower extremity edema.  He smoked 2 packs/day for 25 years, quit in 2007.  Family history includes father had MI in 11s.  Reports he was told he had carotid stenosis on a ultrasound 15 years ago.  Zio patch x 14 days on 07/26/2022 showed 1 sinus pause occurred lasting 3 seconds, 10 episodes of SVT with longest lasting 11 beats.  Echocardiogram 08/18/2022 showed normal biventricular function, no significant valvular disease.  Carotid duplex on 08/04/2022 showed 1 to 39% bilateral carotid stenosis.  Calcium score 709 (86 percentile) on 12/02/2022.  Since last clinic visit, he reports he is doing okay.  Had shoulder injury, being evaluated for surgery.  He typically walks 3 miles per day but has not been recently.  Denies any chest pain, dyspnea, lightheadedness, syncope, lower extremity edema, or palpitations.    Past Medical History:  Diagnosis Date   Arthritis    Cataract    lt. eye beginning  stage   Hyperlipidemia    Hypertension     Past Surgical History:  Procedure Laterality Date   COLONOSCOPY      Current Medications: Current Meds  Medication Sig   allopurinol (ZYLOPRIM) 300 MG tablet Take 1 tablet (300 mg total) by mouth daily.   ALPRAZolam (XANAX) 0.5 MG tablet Take 45 minutes before procedure.   amLODipine (NORVASC) 10 MG tablet Take 1 tablet (10 mg total) by mouth daily.   aspirin EC 81 MG tablet Take 1 tablet (81 mg total) by mouth daily. Swallow whole.   diazepam (VALIUM) 5 MG tablet Take 5 mg by mouth as needed.   ezetimibe (ZETIA) 10 MG tablet Take 1 tablet (10 mg total) by mouth daily.   indomethacin (INDOCIN) 50 MG capsule May take 1 daily with food as needed for pain pends.   irbesartan (AVAPRO) 300 MG tablet Take 1 tablet by mouth once daily   meloxicam (MOBIC) 15 MG tablet Take 15 mg by mouth 2 (two) times daily.   metFORMIN (GLUCOPHAGE-XR) 500 MG 24 hr tablet Take 1 tablet (500 mg total) by mouth at bedtime.   methocarbamol (ROBAXIN) 750 MG tablet Take 750 mg by mouth 2 (two) times daily.   omeprazole (PRILOSEC) 20 MG capsule Take 1 capsule (20 mg total) by mouth daily.   rosuvastatin (CRESTOR) 20 MG tablet Take 1 tablet (20 mg total) by mouth every other day.   traMADol (ULTRAM) 50 MG tablet Take 1 to  2 tablets nightly before bed as needed for pain.     Allergies:   Codeine   Social History   Socioeconomic History   Marital status: Married    Spouse name: Not on file   Number of children: Not on file   Years of education: Not on file   Highest education level: Not on file  Occupational History   Not on file  Tobacco Use   Smoking status: Former    Types: Cigarettes    Quit date: 10/18/2005    Years since quitting: 17.1   Smokeless tobacco: Never  Vaping Use   Vaping Use: Never used  Substance and Sexual Activity   Alcohol use: Not Currently    Comment: quit 19 years ago (2005)   Drug use: No   Sexual activity: Yes    Partners: Female   Other Topics Concern   Not on file  Social History Narrative   Caffiene 2 cups daily   Working Quarry manager.    Exercises treadmill 6 days / week, 7th day bicycles.   Social Determinants of Health   Financial Resource Strain: Low Risk  (11/06/2022)   Overall Financial Resource Strain (CARDIA)    Difficulty of Paying Living Expenses: Not hard at all  Food Insecurity: No Food Insecurity (11/06/2022)   Hunger Vital Sign    Worried About Running Out of Food in the Last Year: Never true    Ran Out of Food in the Last Year: Never true  Transportation Needs: Patient Declined (11/06/2022)   PRAPARE - Transportation    Lack of Transportation (Medical): Patient declined    Lack of Transportation (Non-Medical): Patient declined  Physical Activity: Sufficiently Active (11/06/2022)   Exercise Vital Sign    Days of Exercise per Week: 5 days    Minutes of Exercise per Session: 60 min  Stress: No Stress Concern Present (11/06/2022)   [employer] of Occupational Health - Occupational Stress Questionnaire    Feeling of Stress : Not at all  Social Connections: Socially Integrated (11/06/2022)   Social Connection and Isolation Panel [NHANES]    Frequency of Communication with Friends and Family: Once a week    Frequency of Social Gatherings with Friends and Family: Three times a week    Attends Religious Services: 1 to 4 times per year    Active Member of Clubs or Organizations: Yes    Attends Banker Meetings: 1 to 4 times per year    Marital Status: Living with partner     Family History: The patient's family history includes Alcohol abuse in his father; Cancer in his brother and mother; Colon cancer in his brother; Diabetes in his brother, brother, and father; Drug abuse in his brother; Hearing loss in his brother; Heart attack in his father; Heart disease in his father; High Cholesterol in his brother, brother, and father; High blood pressure in his father;  Miscarriages / Stillbirths in his mother; Stomach cancer in his brother; Stroke in his father. There is no history of Rectal cancer, Esophageal cancer, Pancreatic cancer, Liver cancer, or Prostate cancer.  ROS:   Please see the history of present illness.     All other systems reviewed and are negative.  EKGs/Labs/Other Studies Reviewed:    The following studies were reviewed today:   EKG:   07/26/22: sinus bradycardia, rate 46, no ST abnormalities 11/03/22: Sinus bradycardia, rate 49, no ST abnormalities  Recent Labs: 07/14/2022: TSH 1.11 11/08/2022: ALT 22; BUN 19; Creatinine,  Ser 1.00; Hemoglobin 14.4; Platelets 181.0; Potassium 4.8; Sodium 141  Recent Lipid Panel    Component Value Date/Time   CHOL 149 11/08/2022 0908   TRIG 70.0 11/08/2022 0908   HDL 56.10 11/08/2022 0908   CHOLHDL 3 11/08/2022 0908   VLDL 14.0 11/08/2022 0908   LDLCALC 79 11/08/2022 0908   LDLDIRECT 82.0 02/03/2022 1029    Physical Exam:    VS:  BP 130/70 (BP Location: Left Arm, Patient Position: Sitting, Cuff Size: Normal)   Pulse 66   Ht 5\' 10"  (1.778 m)   Wt 201 lb 12.8 oz (91.5 kg)   SpO2 99%   BMI 28.96 kg/m     Wt Readings from Last 3 Encounters:  12/15/22 201 lb 12.8 oz (91.5 kg)  11/24/22 200 lb 6.4 oz (90.9 kg)  11/08/22 200 lb 9.6 oz (91 kg)     GEN:  Well nourished, well developed in no acute distress HEENT: Normal NECK: No JVD; No carotid bruits LYMPHATICS: No lymphadenopathy CARDIAC: bradycardia, regular no murmurs, rubs, gallops RESPIRATORY:  Clear to auscultation without rales, wheezing or rhonchi  ABDOMEN: Soft, non-tender, non-distended MUSCULOSKELETAL:  No edema; No deformity  SKIN: Warm and dry NEUROLOGIC:  Alert and oriented x 3 PSYCHIATRIC:  Normal affect   ASSESSMENT:    1. Elevated coronary artery calcium score   2. Pre-operative cardiovascular examination   3. Palpitations   4. Sinus pause   5. Essential hypertension   6. Hyperlipidemia, unspecified  hyperlipidemia type   :  PLAN:    CAD: Calcium score 709 (86 percentile) on 12/02/2022.  Denies anginal symptoms -Recommend elevated calcium score, recommend Lexiscan Myoview to rule out ischemia -Continue aspirin, statin, Zetia  Preop evaluation: Prior to shoulder surgery. Planning for Lexiscan Myoview to rule out ischemia given elevated calcium score as above.  If unremarkable no further cardiac workup recommended  Palpitations/sinus pauses: Zio patch x 14 days on 07/26/2022 showed 1 sinus pause occurred lasting 3 seconds, 10 episodes of SVT with longest lasting 11 beats.  Echocardiogram 08/18/2022 showed normal biventricular function, no significant valvular disease.  Carotid duplex on 08/04/2022 showed 1 to 39% bilateral carotid stenosis.   -Seen by Dr. Ladona Ridgel in EP for sinus pause, recommend monitoring -He reported episode of palpitations that lasted about an hour.  No recent palpitations.  Recommend Kardia mobile device for longer-term monitoring  Hypertension: On amlodipine 10 mg daily and irbesartan 300 mg daily.  Appears controlled  Hyperlipidemia: On rosuvastatin 20 mg every other day.  Reports had myalgias with taking daily.  LDL 88 on 10/07/2021.  Calcium score 709 on 12/02/2022.  Added Zetia 10 mg daily  Snoring: has seen sleep medicine, planning sleep study  Carotid stenosis: Reports he was told he had carotid stenosis on a ultrasound 15 years ago.  Carotid duplex on 08/04/2022 showed 1 to 39% bilateral carotid stenosis.  RTC in 6 months  Shared Decision Making/Informed Consent The risks [chest pain, shortness of breath, cardiac arrhythmias, dizziness, blood pressure fluctuations, myocardial infarction, stroke/transient ischemic attack, nausea, vomiting, allergic reaction, radiation exposure, metallic taste sensation and life-threatening complications (estimated to be 1 in 10,000)], benefits (risk stratification, diagnosing coronary artery disease, treatment guidance) and alternatives  of a nuclear stress test were discussed in detail with Eric Salas and he agrees to proceed.    Medication Adjustments/Labs and Tests Ordered: Current medicines are reviewed at length with the patient today.  Concerns regarding medicines are outlined above.  Orders Placed This Encounter  Procedures  MYOCARDIAL PERFUSION IMAGING   No orders of the defined types were placed in this encounter.   Patient Instructions  Medication Instructions:  Your physician recommends that you continue on your current medications as directed. Please refer to the Current Medication list given to you today.   *If you need a refill on your cardiac medications before your next appointment, please call your pharmacy*  Testing/Procedures: Your physician has requested that you have a lexiscan myoview. For further information please visit [email or web address] Please follow instruction sheet, as given. This will take place at [address]  How to prepare for your Myocardial Perfusion Test: Do not eat or drink 3 hours prior to your test, except you may have water. Do not consume products containing caffeine (regular or decaffeinated) 12 hours prior to your test. (ex: coffee, chocolate, sodas, tea). Do bring a list of your current medications with you.  If not listed below, you may take your medications as normal. Do wear comfortable clothes (no dresses or overalls) and walking shoes, tennis shoes preferred (No heels or open toe shoes are allowed). Do NOT wear cologne, perfume, aftershave, or lotions (deodorant is allowed). The test will take approximately 3 to 4 hours to complete If these instructions are not followed, your test will have to be rescheduled.   Follow-Up: At [facility], you and your health needs are our priority.  As part of our continuing mission to provide you with exceptional heart care, we have created designated Provider Care Teams.  These Care Teams include your  primary Cardiologist (physician) and Advanced Practice Providers (APPs -  Physician Assistants and Nurse Practitioners) who all work together to provide you with the care you need, when you need it.  We recommend signing up for the patient portal called "MyChart".  Sign up information is provided on this After Visit Summary.  MyChart is used to connect with patients for Virtual Visits (Telemedicine).  Patients are able to view lab/test results, encounter notes, upcoming appointments, etc.  Non-urgent messages can be sent to your provider as well.   To learn more about what you can do with MyChart, go to [email or web address].    Your next appointment:   6 month(s)  Provider:   Dr. Bjorn Pippin   Signed, Little Ishikawa, MD  12/15/2022 8:34 AM    [facility]

## 2022-12-15 ENCOUNTER — Encounter: Payer: Self-pay | Admitting: Cardiology

## 2022-12-15 ENCOUNTER — Ambulatory Visit: Payer: Managed Care, Other (non HMO) | Attending: Cardiology | Admitting: Cardiology

## 2022-12-15 ENCOUNTER — Other Ambulatory Visit: Payer: Managed Care, Other (non HMO)

## 2022-12-15 VITALS — BP 130/70 | HR 66 | Ht 70.0 in | Wt 201.8 lb

## 2022-12-15 DIAGNOSIS — I455 Other specified heart block: Secondary | ICD-10-CM | POA: Diagnosis not present

## 2022-12-15 DIAGNOSIS — Z01810 Encounter for preprocedural cardiovascular examination: Secondary | ICD-10-CM | POA: Diagnosis not present

## 2022-12-15 DIAGNOSIS — E785 Hyperlipidemia, unspecified: Secondary | ICD-10-CM

## 2022-12-15 DIAGNOSIS — R931 Abnormal findings on diagnostic imaging of heart and coronary circulation: Secondary | ICD-10-CM

## 2022-12-15 DIAGNOSIS — R002 Palpitations: Secondary | ICD-10-CM

## 2022-12-15 DIAGNOSIS — I10 Essential (primary) hypertension: Secondary | ICD-10-CM

## 2022-12-15 NOTE — Patient Instructions (Signed)
Medication Instructions:  Your physician recommends that you continue on your current medications as directed. Please refer to the Current Medication list given to you today.   *If you need a refill on your cardiac medications before your next appointment, please call your pharmacy*  Testing/Procedures: Your physician has requested that you have a lexiscan myoview. For further information please visit [email or web address] Please follow instruction sheet, as given. This will take place at [address]  How to prepare for your Myocardial Perfusion Test: Do not eat or drink 3 hours prior to your test, except you may have water. Do not consume products containing caffeine (regular or decaffeinated) 12 hours prior to your test. (ex: coffee, chocolate, sodas, tea). Do bring a list of your current medications with you.  If not listed below, you may take your medications as normal. Do wear comfortable clothes (no dresses or overalls) and walking shoes, tennis shoes preferred (No heels or open toe shoes are allowed). Do NOT wear cologne, perfume, aftershave, or lotions (deodorant is allowed). The test will take approximately 3 to 4 hours to complete If these instructions are not followed, your test will have to be rescheduled.   Follow-Up: At [facility], you and your health needs are our priority.  As part of our continuing mission to provide you with exceptional heart care, we have created designated Provider Care Teams.  These Care Teams include your primary Cardiologist (physician) and Advanced Practice Providers (APPs -  Physician Assistants and Nurse Practitioners) who all work together to provide you with the care you need, when you need it.  We recommend signing up for the patient portal called "MyChart".  Sign up information is provided on this After Visit Summary.  MyChart is used to connect with patients for Virtual Visits (Telemedicine).  Patients are able to view  lab/test results, encounter notes, upcoming appointments, etc.  Non-urgent messages can be sent to your provider as well.   To learn more about what you can do with MyChart, go to [email or web address].    Your next appointment:   6 month(s)  Provider:   Dr. Bjorn Pippin

## 2022-12-21 ENCOUNTER — Telehealth (HOSPITAL_COMMUNITY): Payer: Self-pay | Admitting: Cardiology

## 2022-12-21 ENCOUNTER — Other Ambulatory Visit: Payer: Self-pay | Admitting: Family Medicine

## 2022-12-21 DIAGNOSIS — R1013 Epigastric pain: Secondary | ICD-10-CM

## 2022-12-21 NOTE — Telephone Encounter (Signed)
Patient cancelled Myoview for the reason below:  12/20/2022 4:56 PM ZO:XWRUEA, SHARON S  Cancel Rsn: Patient (Patient changed to Medicare. Will reschedule when it is pre-approved)   Order will be removed from the [facility].  When patient calls  back we will reinstate the order.   Thank you

## 2022-12-22 ENCOUNTER — Encounter (HOSPITAL_COMMUNITY): Payer: Self-pay

## 2022-12-22 ENCOUNTER — Ambulatory Visit (HOSPITAL_COMMUNITY): Payer: Managed Care, Other (non HMO)

## 2023-01-15 ENCOUNTER — Telehealth: Payer: Self-pay

## 2023-01-15 ENCOUNTER — Other Ambulatory Visit: Payer: Self-pay | Admitting: Nurse Practitioner

## 2023-01-15 DIAGNOSIS — M109 Gout, unspecified: Secondary | ICD-10-CM

## 2023-01-15 NOTE — Telephone Encounter (Signed)
   Pre-operative Risk Assessment    Patient Name: Eric Salas  DOB: [birth year] MRN: [id number]     Request for Surgical Clearance    Procedure: Shoulder Arthroscopy   Date of Surgery:  Clearance 01/19/23                                 Surgeon:  Dr. Linus Salmons Surgeon's Group or Practice Name:  [facility]  Phone number:  [phone] Fax number:  [phone]   Type of Clearance Requested:   - Pharmacy:  Hold Aspirin     Type of Anesthesia:   No listed    Additional requests/questions:    Scarlette Shorts   01/15/2023, 11:22 AM

## 2023-01-15 NOTE — Telephone Encounter (Signed)
Callback team,  Current clearance was submitted only for holding aspirin.  Patient was seen by primary cardiologist and advised to have Lexiscan Myoview completed before clearance can be granted due to elevated calcium score on recent cardiac CTA.  We will also need clarification on type of anesthesia before I can reach out to Dr. Bjorn Pippin to see if clearance can be granted without Lexiscan Myoview being completed.  Please let me know if have any further questions.  Thanks

## 2023-01-15 NOTE — Telephone Encounter (Signed)
Lvm on surgery scheduler Deborah Chalk) vm requesting a call back in regards to clearance.   Surgery schedulers direct number [phone]

## 2023-01-16 ENCOUNTER — Telehealth: Payer: Self-pay | Admitting: Family Medicine

## 2023-01-16 NOTE — Telephone Encounter (Signed)
Will fax notes to surgeon office as FYI. Pt was scheduled for stress test but had cancelled per pt that he has changed to medicare. Per notes from pt on cancelled appt is that he will call back to reschedule the stress test once approved through Medicare.   Surgery will need to be post poned until the pt has had stress test and has been cleared by cardiologist.    We are also requesting what type of anesthesia was going to be used.

## 2023-01-16 NOTE — Telephone Encounter (Signed)
This pt is very upset because he has been told his shoulder surgery has been cancelled due to Dr Doreene Burke not giving clearance until he has a stress test. He does not understand why or what that has to do with shoulder surgery. Please call him with the answer.

## 2023-01-17 NOTE — Telephone Encounter (Signed)
Jennifer with [employer]  States that stress test would be under workman comp and would need this billed as such.   Claim # [id number] Billing info - [facility]  [address] [address] [place], [place] [address]  All things related to shoulder surgery are to billed as such, unless findings such a blockage, in which case should be billed to pt's insurance.   Caller is requesting this process be started and pre-op clearance and test schedule resume.

## 2023-01-17 NOTE — Telephone Encounter (Signed)
I tried to reach the requesting office to be sure that they get the notes that we have sent as FYI. I did not get anyone to answer the line. I will send notes again. See previous notes.   Pt has not yet rescheduled the stress test, which he will need before he can be cleared for his surgery with Dr. Theophilus Bones. Surgery will need to be post poned until the pt has been cleared by his cardiologist.

## 2023-01-19 ENCOUNTER — Telehealth: Payer: Self-pay | Admitting: Cardiology

## 2023-01-19 NOTE — Telephone Encounter (Signed)
Patient  switched insurance.  Stress test was ordered for the 15th but the test will need PA approve again.  He did not re-schedule due to injury at work.  He states surgery was for today but cancelled because he needs a stress test prior to shoulder surgery.  He states workman's comp will pay for test.  He is scheduled Wednesday and was cancelled.  Patient needs approval ASAP for shoulder surgery.  Please advise

## 2023-01-19 NOTE — Telephone Encounter (Signed)
Pt is requesting a callback regarding having his Myocardial Perfusion scheduled since the order has now been canceled. He'd like to discuss further with a nurse. Please advise

## 2023-01-22 ENCOUNTER — Encounter (HOSPITAL_COMMUNITY): Payer: Self-pay | Admitting: *Deleted

## 2023-01-24 ENCOUNTER — Other Ambulatory Visit (HOSPITAL_COMMUNITY): Payer: Self-pay | Admitting: Cardiology

## 2023-01-24 ENCOUNTER — Ambulatory Visit (HOSPITAL_COMMUNITY): Payer: Medicare Other

## 2023-01-24 ENCOUNTER — Ambulatory Visit (HOSPITAL_COMMUNITY): Payer: Medicare Other | Attending: Cardiology

## 2023-01-24 ENCOUNTER — Encounter (HOSPITAL_COMMUNITY): Payer: Self-pay

## 2023-01-24 DIAGNOSIS — R931 Abnormal findings on diagnostic imaging of heart and coronary circulation: Secondary | ICD-10-CM | POA: Insufficient documentation

## 2023-01-24 DIAGNOSIS — Z01810 Encounter for preprocedural cardiovascular examination: Secondary | ICD-10-CM | POA: Insufficient documentation

## 2023-01-24 DIAGNOSIS — Z01818 Encounter for other preprocedural examination: Secondary | ICD-10-CM | POA: Insufficient documentation

## 2023-01-24 LAB — MYOCARDIAL PERFUSION IMAGING
LV dias vol: 101 mL (ref 62–150)
LV sys vol: 36 mL
Nuc Stress EF: 64 %
Peak HR: 75 {beats}/min
Rest HR: 49 {beats}/min
Rest Nuclear Isotope Dose: 10.9 mCi
SDS: 0
SRS: 0
SSS: 0
ST Depression (mm): 0 mm
Stress Nuclear Isotope Dose: 31 mCi

## 2023-01-24 MED ORDER — REGADENOSON 0.4 MG/5ML IV SOLN
0.4000 mg | Freq: Once | INTRAVENOUS | Status: AC
Start: 2023-01-24 — End: 2023-01-24
  Administered 2023-01-24: 0.4 mg via INTRAVENOUS

## 2023-01-24 MED ORDER — TECHNETIUM TC 99M TETROFOSMIN IV KIT
10.9000 | PACK | Freq: Once | INTRAVENOUS | Status: AC | PRN
  Administered 2023-01-24: 10.9 via INTRAVENOUS

## 2023-01-24 MED ORDER — TECHNETIUM TC 99M TETROFOSMIN IV KIT
31.0000 | PACK | Freq: Once | INTRAVENOUS | Status: AC | PRN
  Administered 2023-01-24: 31 via INTRAVENOUS

## 2023-01-24 NOTE — Telephone Encounter (Signed)
Already spoke to patient stress test results given and faxed to Dr.Starman at fax # [phone].

## 2023-01-24 NOTE — Telephone Encounter (Signed)
Left message for the pt to call the clinic

## 2023-01-24 NOTE — Telephone Encounter (Signed)
Patient is calling because he completed his stress test and would like [place] to approve for his surgery. Please advise.

## 2023-01-24 NOTE — Telephone Encounter (Signed)
Low risk stress test, no further testing recommended prior to his surgery

## 2023-01-24 NOTE — Telephone Encounter (Signed)
Follow Up:     Patient is returning a call from today. 

## 2023-01-24 NOTE — Telephone Encounter (Signed)
Patient states he has had his stress test and would like for his results to be finalized so can be cleared for surgery. He would like to have surgery this Friday.

## 2023-01-25 ENCOUNTER — Telehealth: Payer: Self-pay | Admitting: Cardiology

## 2023-01-25 NOTE — Telephone Encounter (Signed)
Geoffery Spruce is calling from [facility] requesting the patient's clearance be re-faxed to [phone].   Please advise.

## 2023-01-25 NOTE — Telephone Encounter (Signed)
Refaxed Clearance stress tess via Epic and manual fax

## 2023-01-25 NOTE — Telephone Encounter (Signed)
   Name: Eric Salas  DOB: 05-25-1956  MRN: [id number]   Primary Cardiologist: None  Chart reviewed as part of pre-operative protocol coverage.   Low risk stress test, no further testing recommended prior to his surgery   Therefore, based on ACC/AHA guidelines, the patient would be at acceptable risk for the planned procedure without further cardiovascular testing.   I will route this recommendation to the requesting party via Epic fax function and remove from pre-op pool. Please call with questions.  [name], PA 01/25/2023, 11:35 AM

## 2023-01-26 HISTORY — PX: SHOULDER ARTHROSCOPY: SHX128

## 2023-02-01 ENCOUNTER — Telehealth: Payer: Self-pay | Admitting: Family Medicine

## 2023-02-01 NOTE — Telephone Encounter (Signed)
Called and scheduled an appointment for a 3 month f/u on 02/20/23.  Needs all his meds sent to [facility] due to insurance change.  Will go through and get them sent in for him. Dm/cma

## 2023-02-01 NOTE — Telephone Encounter (Signed)
Pt is requesting that all his meds be sent to [facility].  Fax # [phone] Tel # [phone]

## 2023-02-20 ENCOUNTER — Ambulatory Visit: Payer: Medicare Other | Admitting: Family Medicine

## 2023-02-20 ENCOUNTER — Encounter: Payer: Self-pay | Admitting: Family Medicine

## 2023-02-20 VITALS — BP 126/68 | HR 68 | Temp 98.6°F | Ht 70.0 in | Wt 207.8 lb

## 2023-02-20 DIAGNOSIS — I10 Essential (primary) hypertension: Secondary | ICD-10-CM | POA: Diagnosis not present

## 2023-02-20 DIAGNOSIS — E7800 Pure hypercholesterolemia, unspecified: Secondary | ICD-10-CM | POA: Diagnosis not present

## 2023-02-20 DIAGNOSIS — R7303 Prediabetes: Secondary | ICD-10-CM

## 2023-02-20 DIAGNOSIS — M109 Gout, unspecified: Secondary | ICD-10-CM | POA: Diagnosis not present

## 2023-02-20 LAB — BASIC METABOLIC PANEL
BUN: 19 mg/dL (ref 6–23)
CO2: 24 mEq/L (ref 19–32)
Calcium: 9.2 mg/dL (ref 8.4–10.5)
Chloride: 105 mEq/L (ref 96–112)
Creatinine, Ser: 0.92 mg/dL (ref 0.40–1.50)
GFR: 86.86 mL/min (ref >60.00)
Glucose, Bld: 83 mg/dL (ref 70–99)
Potassium: 4.2 mEq/L (ref 3.5–5.1)
Sodium: 141 mEq/L (ref 135–145)

## 2023-02-20 LAB — HEMOGLOBIN A1C: Hgb A1c MFr Bld: 5.8 % (ref 4.6–6.5)

## 2023-02-20 LAB — URIC ACID: Uric Acid, Serum: 5.4 mg/dL (ref 4.0–7.8)

## 2023-02-20 MED ORDER — IRBESARTAN 300 MG PO TABS: 300.0000 mg | ORAL_TABLET | Freq: Every day | ORAL | 0 refills | Status: DC

## 2023-02-20 MED ORDER — METFORMIN HCL ER 500 MG PO TB24: 500.0000 mg | ORAL_TABLET | Freq: Every evening | ORAL | 1 refills | Status: DC

## 2023-02-20 NOTE — Progress Notes (Signed)
Eric.Salas  Established Patient Office Visit   Subjective:  Patient ID: Eric Salas, male    DOB: [birth year]  Age: 67 y.o. MRN: [id number]  Chief Complaint  Patient presents with   Medical Management of Chronic Issues    3 month follow up. Pt is not fasting. Pt states he is doing well. Recently has right rotator cuff surgery.     HPI Encounter Diagnoses  Name Primary?   Prediabetes Yes   Elevated LDL cholesterol level    Gout, unspecified cause, unspecified chronicity, unspecified site    Hypertension goal BP (blood pressure) < 140/80    For follow-up of above.  Continues metformin for prediabetes.  Continues rosuvastatin 20 mg q. OD with [place] daily.  He does not tolerate the daily dose of rosuvastatin.  Last LDL was 79.  Continue with a low-fat low-cholesterol diet.  Coronary artery calcium score was quite elevated.  Recent stress test was negative.  He has no exertional chest pain or shortness of breath.  Continues with daily allopurinol.  Uric acid has increased.  Status post rotator cuff surgery back on July 11.  Upcoming follow-up with orthopedics on the 19th.  He is in physical therapy.   Review of Systems  Constitutional: Negative.   HENT: Negative.    Eyes:  Negative for blurred vision, discharge and redness.  Respiratory: Negative.    Cardiovascular: Negative.   Gastrointestinal:  Negative for abdominal pain.  Genitourinary: Negative.   Musculoskeletal: Negative.  Negative for myalgias.  Skin:  Negative for rash.  Neurological:  Negative for tingling, loss of consciousness and weakness.  Endo/Heme/Allergies:  Negative for polydipsia.      11/24/2022    2:31 PM 11/08/2022    8:17 AM 10/06/2022    1:07 PM  Depression screen PHQ 2/9  Decreased Interest 0 0 0  Down, Depressed, Hopeless 0 0 0  PHQ - 2 Score 0 0 0       Current Outpatient Medications:    allopurinol (ZYLOPRIM) 300 MG tablet, Take 1 tablet (300 mg total) by mouth daily., Disp: 90 tablet, Rfl: 3    ALPRAZolam (XANAX) 0.5 MG tablet, Take 45 minutes before procedure., Disp: 1 tablet, Rfl: 0   amLODipine (NORVASC) 10 MG tablet, Take 1 tablet (10 mg total) by mouth daily., Disp: 90 tablet, Rfl: 1   aspirin EC 81 MG tablet, Take 1 tablet (81 mg total) by mouth daily. Swallow whole., Disp: 90 tablet, Rfl: 3   diazepam (VALIUM) 5 MG tablet, Take 5 mg by mouth as needed., Disp: , Rfl:    ezetimibe (ZETIA) 10 MG tablet, Take 1 tablet (10 mg total) by mouth daily., Disp: 90 tablet, Rfl: 3   indomethacin (INDOCIN) 50 MG capsule, TAKE 1 CAPSULE BY MOUTH WITH  FOOD DAILY AS NEEDED FOR PAIN, Disp: 90 capsule, Rfl: 3   meloxicam (MOBIC) 15 MG tablet, Take 15 mg by mouth 2 (two) times daily., Disp: , Rfl:    methocarbamol (ROBAXIN) 750 MG tablet, Take 750 mg by mouth 2 (two) times daily., Disp: , Rfl:    omeprazole (PRILOSEC) 20 MG capsule, TAKE 1 CAPSULE BY MOUTH DAILY, Disp: 90 capsule, Rfl: 3   rosuvastatin (CRESTOR) 20 MG tablet, Take 1 tablet (20 mg total) by mouth every other day., Disp: 90 tablet, Rfl: 2   traMADol (ULTRAM) 50 MG tablet, Take 1 to 2 tablets nightly before bed as needed for pain., Disp: 30 tablet, Rfl: 0   irbesartan (AVAPRO) 300 MG tablet, Take  1 tablet (300 mg total) by mouth daily., Disp: 90 tablet, Rfl: 0   metFORMIN (GLUCOPHAGE-XR) 500 MG 24 hr tablet, Take 1 tablet (500 mg total) by mouth at bedtime., Disp: 90 tablet, Rfl: 1   Objective:     BP 126/68   Pulse 68   Temp 98.6 F (37 C)   Ht 5\' 10"  (1.778 m)   Wt 207 lb 12.8 oz (94.3 kg)   SpO2 98%   BMI 29.82 kg/m    Physical Exam Constitutional:      General: He is not in acute distress.    Appearance: Normal appearance. He is not ill-appearing, toxic-appearing or diaphoretic.  HENT:     Head: Normocephalic and atraumatic.     Right Ear: External ear normal.     Left Ear: External ear normal.  Eyes:     General: No scleral icterus.       Right eye: No discharge.        Left eye: No discharge.     Extraocular  Movements: Extraocular movements intact.     Conjunctiva/sclera: Conjunctivae normal.  Pulmonary:     Effort: Pulmonary effort is normal. No respiratory distress.  Musculoskeletal:       Arms:  Skin:    General: Skin is warm and dry.  Neurological:     Mental Status: He is alert and oriented to person, place, and time.  Psychiatric:        Mood and Affect: Mood normal.        Behavior: Behavior normal.      No results found for any visits on 02/20/23.    The 10-year ASCVD risk score (Arnett DK, et al., 2019) is: 12%    Assessment & Plan:   Prediabetes -     Irbesartan; Take 1 tablet (300 mg total) by mouth daily.  Dispense: 90 tablet; Refill: 0 -     metFORMIN HCl ER; Take 1 tablet (500 mg total) by mouth at bedtime.  Dispense: 90 tablet; Refill: 1 -     Basic metabolic panel -     Hemoglobin A1c  Elevated LDL cholesterol level  Gout, unspecified cause, unspecified chronicity, unspecified site -     Uric acid  Hypertension goal BP (blood pressure) < 140/80 -     Irbesartan; Take 1 tablet (300 mg total) by mouth daily.  Dispense: 90 tablet; Refill: 0    Return in about 6 months (around 08/23/2023), or if symptoms worsen or fail to improve.  Continue all medications as above.  Information was given on preventing high cholesterol.  He continues with rosuvastatin 20 q. OD and Zetia.  Continues follow-up with cardiology.  Continue metformin as above.  May adjust pending results of A1c.  Information was given on lowering purines in the diet and preventing high cholesterol.  Eric Sax, MD

## 2023-02-21 ENCOUNTER — Other Ambulatory Visit: Payer: Self-pay

## 2023-02-21 DIAGNOSIS — I10 Essential (primary) hypertension: Secondary | ICD-10-CM

## 2023-02-21 DIAGNOSIS — R7303 Prediabetes: Secondary | ICD-10-CM

## 2023-02-21 MED ORDER — METFORMIN HCL ER 500 MG PO TB24: 500.0000 mg | ORAL_TABLET | Freq: Every evening | ORAL | 1 refills | Status: DC

## 2023-02-21 MED ORDER — IRBESARTAN 300 MG PO TABS
300.0000 mg | ORAL_TABLET | Freq: Every day | ORAL | 0 refills | Status: DC
Start: 2023-02-21 — End: 2023-08-15

## 2023-02-21 MED ORDER — AMLODIPINE BESYLATE 10 MG PO TABS
10.0000 mg | ORAL_TABLET | Freq: Every day | ORAL | 1 refills | Status: DC
Start: 2023-02-21 — End: 2023-09-17

## 2023-02-21 NOTE — Telephone Encounter (Signed)
Received a refill request from pharmacy.  Requesting: Amlodipine Besylate 10mg  Last Visit: 02/20/2023 Next Visit: Visit date not found Last Refill: 07/14/2022  Please Advise

## 2023-04-23 ENCOUNTER — Ambulatory Visit: Payer: Medicare Other | Admitting: Neurology

## 2023-04-23 DIAGNOSIS — R002 Palpitations: Secondary | ICD-10-CM

## 2023-04-23 DIAGNOSIS — G4733 Obstructive sleep apnea (adult) (pediatric): Secondary | ICD-10-CM

## 2023-04-23 DIAGNOSIS — Z9189 Other specified personal risk factors, not elsewhere classified: Secondary | ICD-10-CM

## 2023-04-23 DIAGNOSIS — R0683 Snoring: Secondary | ICD-10-CM

## 2023-04-23 DIAGNOSIS — R0681 Apnea, not elsewhere classified: Secondary | ICD-10-CM

## 2023-04-23 DIAGNOSIS — R001 Bradycardia, unspecified: Secondary | ICD-10-CM

## 2023-04-23 DIAGNOSIS — E663 Overweight: Secondary | ICD-10-CM

## 2023-04-23 DIAGNOSIS — R0689 Other abnormalities of breathing: Secondary | ICD-10-CM

## 2023-04-26 NOTE — Procedures (Signed)
   [facility]  HOME SLEEP TEST (Watch PAT) REPORT  STUDY DATE: 04/23/2023  DOB: 06-30-1956  MRN: [id number]  ORDERING CLINICIAN: Huston Foley, MD, PhD   REFERRING CLINICIAN: Rodman Pickle, NP  CLINICAL INFORMATION/HISTORY: 66 year old male with an underlying medical history of hypertension, gout, hyperlipidemia, sinus tachycardia and overweight state, who reports snoring and excessive daytime somnolence as well as waking up with a sense of gasping for air.   Epworth sleepiness score: 10/24.  BMI: 29.7 kg/m  FINDINGS:   Sleep Summary:   Total Recording Time (hours, min): 8 hours, 12 min  Total Sleep Time (hours, min):  7 hours, 0 min  Percent REM (%):    24.6%   Respiratory Indices:   Calculated pAHI (per hour):  16/hour         REM pAHI:    23.9/hour       NREM pAHI: 13.5/hour  Central pAHI:  0.7/hour  Oxygen Saturation Statistics:    Oxygen Saturation (%) Mean: 92%   Minimum oxygen saturation (%):                 80%   O2 Saturation Range (%): 80 - 97%    O2 Saturation (minutes) <=88%: 4.9 min  Pulse Rate Statistics:   Pulse Mean (bpm):    61/min    Pulse Range (46 - 170/min)   IMPRESSION: OSA (obstructive sleep apnea)   RECOMMENDATION:  This home sleep test demonstrates moderate obstructive sleep apnea with a total AHI of 16/hour and O2 nadir of 80%.  Intermittent mild to moderate snoring was detected. Treatment with a positive airway pressure (PAP) device is recommended. The patient will be advised to proceed with an autoPAP titration/trial at home for now. A full night titration study may be considered to optimize treatment settings, monitor proper oxygen saturations and aid with improvement of tolerance and adherence, if needed down the road. Alternative treatment options may include a dental device through dentistry or orthodontics in selected patients or Inspire (hypoglossal nerve stimulator) in carefully selected patients (meeting  inclusion criteria).  Concomitant weight loss is recommended (where clinically appropriate). Please note that untreated obstructive sleep apnea may carry additional perioperative morbidity. Patients with significant obstructive sleep apnea should receive perioperative PAP therapy and the surgeons and particularly the anesthesiologist should be informed of the diagnosis and the severity of the sleep disordered breathing. The patient should be cautioned not to drive, work at heights, or operate dangerous or heavy equipment when tired or sleepy. Review and reiteration of good sleep hygiene measures should be pursued with any patient. Other causes of the patient's symptoms, including circadian rhythm disturbances, an underlying mood disorder, medication effect and/or an underlying medical problem cannot be ruled out based on this test. Clinical correlation is recommended.  The patient and his referring provider will be notified of the test results. The patient will be seen in follow up in sleep clinic at [facility].  I certify that I have reviewed the raw data recording prior to the issuance of this report in accordance with the standards of the American Academy of Sleep Medicine (AASM).    INTERPRETING PHYSICIAN:   Huston Foley, MD, PhD Medical Director, [facility]) Diplomat, ABPN (Neurology and Sleep)   [facility] [address], [address] [place], [place] [address] [phone]

## 2023-04-26 NOTE — Addendum Note (Signed)
Addended by: Huston Foley on: 04/26/2023 05:06 PM   Modules accepted: Orders

## 2023-04-26 NOTE — Progress Notes (Signed)
See procedure note.

## 2023-05-01 ENCOUNTER — Telehealth: Payer: Self-pay | Admitting: *Deleted

## 2023-05-01 NOTE — Telephone Encounter (Signed)
I spoke with the patient and discussed his sleep study results.  The patient verbalized understanding and he is amenable to trying AutoPap.  We discussed the different treatment AutoPap and CPAP and I answered his questions.  The patient lives in [facility] and prefers a DME there.  I have referred him to [facility].  We discussed the insurance compliance requirements which includes using the machine at least 4 hours at night and also being seen in our office between 30 and 90 days after set up.  I scheduled him for a MyChart video visit on 07/20/2023 at 8:45 AM.  The patient will watch for a call from [employer].  He verbalized appreciation for the call.  Sleep study report sent to referring provider.

## 2023-05-01 NOTE — Telephone Encounter (Signed)
-----   Message from Huston Foley sent at 04/26/2023  5:06 PM EDT ----- Patient referred by PCP NP, seen by me on 08/03/2022, patient had a HST on 04/23/2023.    Please call and notify the patient that the recent home sleep test showed obstructive sleep apnea in the moderate range. I recommend treatment in the form of autoPAP, which means, that we don't have to bring him in for a sleep study with CPAP, but will let him start using a so called autoPAP machine at home, which is a CPAP-like machine with self-adjusting pressures. We will send the order to a local DME company (of his choice, or as per insurance requirement). The DME representative will fit him with a mask, educate him on how to use the machine, how to put the mask on, etc. I have placed an order in the chart. Please send the order, talk to patient, send report to referring MD. We will need a FU in sleep clinic for 10 weeks post-PAP set up, please arrange that with me or one of our NPs. Also reinforce the need for compliance with treatment. Thanks,   Huston Foley, MD, PhD [facility])

## 2023-05-02 NOTE — Telephone Encounter (Signed)
[employer] confirmed receipt of order.  

## 2023-05-14 DIAGNOSIS — G4733 Obstructive sleep apnea (adult) (pediatric): Secondary | ICD-10-CM | POA: Diagnosis not present

## 2023-06-14 DIAGNOSIS — G4733 Obstructive sleep apnea (adult) (pediatric): Secondary | ICD-10-CM | POA: Diagnosis not present

## 2023-06-22 ENCOUNTER — Encounter: Payer: Self-pay | Admitting: Cardiology

## 2023-06-22 ENCOUNTER — Ambulatory Visit: Payer: Medicare Other | Attending: Cardiology | Admitting: Cardiology

## 2023-06-22 VITALS — BP 144/82 | HR 69 | Ht 70.0 in | Wt 220.0 lb

## 2023-06-22 DIAGNOSIS — I2510 Atherosclerotic heart disease of native coronary artery without angina pectoris: Secondary | ICD-10-CM

## 2023-06-22 DIAGNOSIS — R002 Palpitations: Secondary | ICD-10-CM

## 2023-06-22 DIAGNOSIS — E785 Hyperlipidemia, unspecified: Secondary | ICD-10-CM

## 2023-06-22 DIAGNOSIS — I10 Essential (primary) hypertension: Secondary | ICD-10-CM | POA: Diagnosis not present

## 2023-06-22 NOTE — Patient Instructions (Addendum)
Medication Instructions:  Continue current medications *If you need a refill on your cardiac medications before your next appointment, please call your pharmacy*   Lab Work: Lipid Panel, bmet  today If you have labs (blood work) drawn today and your tests are completely normal, you will receive your results only by: MyChart Message (if you have MyChart) OR A paper copy in the mail If you have any lab test that is abnormal or we need to change your treatment, we will call you to review the results.   Testing/Procedures: none   Follow-Up: At [facility], you and your health needs are our priority.  As part of our continuing mission to provide you with exceptional heart care, we have created designated Provider Care Teams.  These Care Teams include your primary Cardiologist (physician) and Advanced Practice Providers (APPs -  Physician Assistants and Nurse Practitioners) who all work together to provide you with the care you need, when you need it.  We recommend signing up for the patient portal called "MyChart".  Sign up information is provided on this After Visit Summary.  MyChart is used to connect with patients for Virtual Visits (Telemedicine).  Patients are able to view lab/test results, encounter notes, upcoming appointments, etc.  Non-urgent messages can be sent to your provider as well.   To learn more about what you can do with MyChart, go to [email or web address].    Your next appointment:   6 month(s)  Provider:   Dr. Bjorn Pippin  Other Instructions Please check blood pressure once a day for two weeks and send via mychart

## 2023-06-22 NOTE — Progress Notes (Signed)
Cardiology Office Note:    Date:  06/22/2023   ID:  Eric Salas, DOB [birth year], MRN [id number]  PCP:  [name], MD  Cardiologist:  Little Ishikawa, MD  Electrophysiologist:  None   Referring MD: [name],*   Chief Complaint  Patient presents with   Coronary Artery Disease    History of Present Illness:    Eric Salas is a 67 y.o. male with a hx of hypertension, hyperlipidemia, OSA who presents for follow-up.  He was referred by Rodman Pickle, NP for evaluation of palpitations, initially seen 07/26/2022.  He reports that he is very active, works out twice daily 4 days/week and once daily for another 2 days/week.  States that recently he checked his heart rate at the gym and it was up to 120 bpm before he had started exercising.  He started feeling palpitations like his heart was racing.  Has happened 5 times since then.  Typically palpitations and tachycardia will last 30 to 60 minutes.  He denies any exertional symptoms.  Denies any chest pain, dyspnea, lightheadedness, syncope, lower extremity edema.  He smoked 2 packs/day for 25 years, quit in 2007.  Family history includes father had MI in 46s.  Reports he was told he had carotid stenosis on a ultrasound 15 years ago.  Zio patch x 14 days on 07/26/2022 showed 1 sinus pause occurred lasting 3 seconds, 10 episodes of SVT with longest lasting 11 beats.  Echocardiogram 08/18/2022 showed normal biventricular function, no significant valvular disease.  Carotid duplex on 08/04/2022 showed 1 to 39% bilateral carotid stenosis.  Calcium score 709 (86 percentile) on 12/02/2022.  Lexiscan Myoview on 01/24/2023 showed very small area of ischemia in the basal inferior wall, low risk study.  Since last clinic visit, he reports he is doing okay.  Had shoulder surgery and was inactive after this, gained 20 pounds.  Started going back to the gym and walking 40 minutes 5 days/week.  Denied any exertional chest pain  or dyspnea.  Unfortunately recently injured his back and has not been able to walk recently.  He denies any lightheadedness, syncope, lower extremity edema, or palpitations.   Past Medical History:  Diagnosis Date   Arthritis    Cataract    lt. eye beginning stage   H/O rotator cuff surgery 2024   Right Shoulder   Hyperlipidemia    Hypertension     Past Surgical History:  Procedure Laterality Date   COLONOSCOPY      Current Medications: Current Meds  Medication Sig   allopurinol (ZYLOPRIM) 300 MG tablet Take 1 tablet (300 mg total) by mouth daily.   amLODipine (NORVASC) 10 MG tablet Take 1 tablet (10 mg total) by mouth daily.   aspirin EC 81 MG tablet Take 1 tablet (81 mg total) by mouth daily. Swallow whole.   ezetimibe (ZETIA) 10 MG tablet Take 1 tablet (10 mg total) by mouth daily.   indomethacin (INDOCIN) 50 MG capsule TAKE 1 CAPSULE BY MOUTH WITH  FOOD DAILY AS NEEDED FOR PAIN   irbesartan (AVAPRO) 300 MG tablet Take 1 tablet (300 mg total) by mouth daily.   metFORMIN (GLUCOPHAGE-XR) 500 MG 24 hr tablet Take 1 tablet (500 mg total) by mouth at bedtime.   rosuvastatin (CRESTOR) 20 MG tablet Take 1 tablet (20 mg total) by mouth every other day.     Allergies:   Codeine   Social History   Socioeconomic History   Marital status: Married  Spouse name: Not on file   Number of children: Not on file   Years of education: Not on file   Highest education level: Not on file  Occupational History   Not on file  Tobacco Use   Smoking status: Former    Current packs/day: 0.00    Types: Cigarettes    Quit date: 10/18/2005    Years since quitting: 17.6   Smokeless tobacco: Never  Vaping Use   Vaping status: Never Used  Substance and Sexual Activity   Alcohol use: Not Currently    Comment: quit 19 years ago (2005)   Drug use: No   Sexual activity: Yes    Partners: Female  Other Topics Concern   Not on file  Social History Narrative   Caffiene 2 cups daily   Working  Quarry manager.    Exercises treadmill 6 days / week, 7th day bicycles.   Social Determinants of Health   Financial Resource Strain: Low Risk  (11/06/2022)   Overall Financial Resource Strain (CARDIA)    Difficulty of Paying Living Expenses: Not hard at all  Food Insecurity: No Food Insecurity (11/06/2022)   Hunger Vital Sign    Worried About Running Out of Food in the Last Year: Never true    Ran Out of Food in the Last Year: Never true  Transportation Needs: Patient Declined (11/06/2022)   PRAPARE - Transportation    Lack of Transportation (Medical): Patient declined    Lack of Transportation (Non-Medical): Patient declined  Physical Activity: Sufficiently Active (11/06/2022)   Exercise Vital Sign    Days of Exercise per Week: 5 days    Minutes of Exercise per Session: 60 min  Stress: No Stress Concern Present (11/06/2022)   [employer] of Occupational Health - Occupational Stress Questionnaire    Feeling of Stress : Not at all  Social Connections: Unknown (12/15/2022)   Received from [employer], [facility]   Social Network    Social Network: Not on file     Family History: The patient's family history includes Alcohol abuse in his father; Cancer in his brother and mother; Colon cancer in his brother; Diabetes in his brother, brother, and father; Drug abuse in his brother; Hearing loss in his brother; Heart attack in his father; Heart disease in his father; High Cholesterol in his brother, brother, and father; High blood pressure in his father; Miscarriages / [place] in his mother; Stomach cancer in his brother; Stroke in his father. There is no history of Rectal cancer, Esophageal cancer, Pancreatic cancer, Liver cancer, or Prostate cancer.  ROS:   Please see the history of present illness.     All other systems reviewed and are negative.  EKGs/Labs/Other Studies Reviewed:    The following studies were reviewed today:   EKG:   07/26/22: sinus  bradycardia, rate 46, no ST abnormalities 11/03/22: Sinus bradycardia, rate 49, no ST abnormalities 06/22/23: NSR, rate 60, no ST abnormalities  Recent Labs: 07/14/2022: TSH 1.11 11/08/2022: ALT 22; Hemoglobin 14.4; Platelets 181.0 02/20/2023: BUN 19; Creatinine, Ser 0.92; Potassium 4.2; Sodium 141  Recent Lipid Panel    Component Value Date/Time   CHOL 149 11/08/2022 0908   TRIG 70.0 11/08/2022 0908   HDL 56.10 11/08/2022 0908   CHOLHDL 3 11/08/2022 0908   VLDL 14.0 11/08/2022 0908   LDLCALC 79 11/08/2022 0908   LDLDIRECT 82.0 02/03/2022 1029    Physical Exam:    VS:  BP (!) 144/82 (BP Location: Left Arm, Patient  Position: Sitting, Cuff Size: Normal)   Pulse 69   Ht 5\' 10"  (1.778 m)   Wt 220 lb (99.8 kg)   SpO2 97%   BMI 31.57 kg/m     Wt Readings from Last 3 Encounters:  06/22/23 220 lb (99.8 kg)  02/20/23 207 lb 12.8 oz (94.3 kg)  01/24/23 201 lb (91.2 kg)     GEN:  Well nourished, well developed in no acute distress HEENT: Normal NECK: No JVD; No carotid bruits LYMPHATICS: No lymphadenopathy CARDIAC: bradycardia, regular no murmurs, rubs, gallops RESPIRATORY:  Clear to auscultation without rales, wheezing or rhonchi  ABDOMEN: Soft, non-tender, non-distended MUSCULOSKELETAL:  No edema; No deformity  SKIN: Warm and dry NEUROLOGIC:  Alert and oriented x 3 PSYCHIATRIC:  Normal affect   ASSESSMENT:    1. Coronary artery disease involving native coronary artery of native heart without angina pectoris   2. Palpitations   3. Hyperlipidemia, unspecified hyperlipidemia type   4. Essential hypertension    :  PLAN:    CAD: Calcium score 709 (86 percentile) on 12/02/2022.  Denies anginal symptoms.   Lexiscan Myoview on 01/24/2023 showed very small area of ischemia in the basal inferior wall, low risk study. -Continue aspirin, statin, Zetia  Palpitations/sinus pauses: Zio patch x 14 days on 07/26/2022 showed 1 sinus pause occurred lasting 3 seconds, 10 episodes of SVT with  longest lasting 11 beats.  Echocardiogram 08/18/2022 showed normal biventricular function, no significant valvular disease.  Carotid duplex on 08/04/2022 showed 1 to 39% bilateral carotid stenosis.   -Reports no recent palpitations -Seen by Dr. Ladona Ridgel in EP for sinus pause, recommend monitoring  Hypertension: On amlodipine 10 mg daily and irbesartan 300 mg daily.  BP elevated in clinic today but reports is having back pain which may be contributing.  Asked to check BP daily for next 2 weeks and let us know results.  Discussed limiting dietary sodium intake  Hyperlipidemia: On rosuvastatin 20 mg every other day.  Reports had myalgias with taking daily.  LDL 88 on 10/07/2021.  Calcium score 709 on 12/02/2022.  Added Zetia 10 mg daily.  Check lipid  OSA: on CPAP, reports compliance  Carotid stenosis: Reports he was told he had carotid stenosis on a ultrasound 15 years ago.  Carotid duplex on 08/04/2022 showed 1 to 39% bilateral carotid stenosis.  Continue aspirin, statin  RTC in 6 months    Medication Adjustments/Labs and Tests Ordered: Current medicines are reviewed at length with the patient today.  Concerns regarding medicines are outlined above.  Orders Placed This Encounter  Procedures   Lipid panel   Basic Metabolic Panel (BMET)   EKG 12-Lead   No orders of the defined types were placed in this encounter.   Patient Instructions  Medication Instructions:  Continue current medications *If you need a refill on your cardiac medications before your next appointment, please call your pharmacy*   Lab Work: Lipid Panel, bmet  today If you have labs (blood work) drawn today and your tests are completely normal, you will receive your results only by: MyChart Message (if you have MyChart) OR A paper copy in the mail If you have any lab test that is abnormal or we need to change your treatment, we will call you to review the results.   Testing/Procedures: none   Follow-Up: At [facility], you and your health needs are our priority.  As part of our continuing mission to provide you with exceptional heart care, we have created  designated Provider Care Teams.  These Care Teams include your primary Cardiologist (physician) and Advanced Practice Providers (APPs -  Physician Assistants and Nurse Practitioners) who all work together to provide you with the care you need, when you need it.  We recommend signing up for the patient portal called "MyChart".  Sign up information is provided on this After Visit Summary.  MyChart is used to connect with patients for Virtual Visits (Telemedicine).  Patients are able to view lab/test results, encounter notes, upcoming appointments, etc.  Non-urgent messages can be sent to your provider as well.   To learn more about what you can do with MyChart, go to [email or web address].    Your next appointment:   6 month(s)  Provider:   Dr. Bjorn Pippin  Other Instructions Please check blood pressure once a day for two weeks and send via mychart    Signed, Little Ishikawa, MD  06/22/2023 8:35 AM    [facility]

## 2023-06-23 LAB — BASIC METABOLIC PANEL
BUN/Creatinine Ratio: 21 (ref 10–24)
BUN: 20 mg/dL (ref 8–27)
CO2: 26 mmol/L (ref 20–29)
Calcium: 9 mg/dL (ref 8.6–10.2)
Chloride: 103 mmol/L (ref 96–106)
Creatinine, Ser: 0.94 mg/dL (ref 0.76–1.27)
Glucose: 113 mg/dL — ABNORMAL HIGH (ref 70–99)
Potassium: 4.4 mmol/L (ref 3.5–5.2)
Sodium: 141 mmol/L (ref 134–144)
eGFR: 89 mL/min/{1.73_m2} (ref >59)

## 2023-06-23 LAB — LIPID PANEL
Chol/HDL Ratio: 2.6 {ratio} (ref 0.0–5.0)
Cholesterol, Total: 116 mg/dL (ref 100–199)
HDL: 44 mg/dL (ref >39)
LDL Chol Calc (NIH): 52 mg/dL (ref 0–99)
Triglycerides: 109 mg/dL (ref 0–149)
VLDL Cholesterol Cal: 20 mg/dL (ref 5–40)

## 2023-06-28 ENCOUNTER — Telehealth: Payer: Self-pay | Admitting: *Deleted

## 2023-06-28 NOTE — Telephone Encounter (Signed)
Spoke to patient and results seen and given. Patient voiced understanding.

## 2023-06-30 ENCOUNTER — Telehealth: Payer: Medicare Other | Admitting: Emergency Medicine

## 2023-06-30 DIAGNOSIS — M109 Gout, unspecified: Secondary | ICD-10-CM

## 2023-06-30 MED ORDER — COLCHICINE 0.6 MG PO CAPS: ORAL_CAPSULE | ORAL | 1 refills | Status: AC

## 2023-06-30 NOTE — Patient Instructions (Signed)
Saul Fordyce Tasso, thank you for joining Cathlyn Parsons, NP for today's virtual visit.  While this provider is not your primary care provider (PCP), if your PCP is located in our provider database this encounter information will be shared with them immediately following your visit.   A [facility] MyChart account gives you access to today's visit and all your visits, tests, and labs performed at [facility] " click here if you don't have a [facility] MyChart account or go to [email or web address]  Consent: (Patient) Eric Salas provided verbal consent for this virtual visit at the beginning of the encounter.  Current Medications:  Current Outpatient Medications:    Colchicine 0.6 MG CAPS, 1.2mg  po x1  then 0.6mg  an hour later; do not repeat treatment for at least 3 days, Disp: 3 capsule, Rfl: 1   allopurinol (ZYLOPRIM) 300 MG tablet, Take 1 tablet (300 mg total) by mouth daily., Disp: 90 tablet, Rfl: 3   ALPRAZolam (XANAX) 0.5 MG tablet, Take 45 minutes before procedure. (Patient not taking: Reported on 06/22/2023), Disp: 1 tablet, Rfl: 0   amLODipine (NORVASC) 10 MG tablet, Take 1 tablet (10 mg total) by mouth daily., Disp: 90 tablet, Rfl: 1   aspirin EC 81 MG tablet, Take 1 tablet (81 mg total) by mouth daily. Swallow whole., Disp: 90 tablet, Rfl: 3   diazepam (VALIUM) 5 MG tablet, Take 5 mg by mouth as needed. (Patient not taking: Reported on 06/22/2023), Disp: , Rfl:    ezetimibe (ZETIA) 10 MG tablet, Take 1 tablet (10 mg total) by mouth daily., Disp: 90 tablet, Rfl: 3   indomethacin (INDOCIN) 50 MG capsule, TAKE 1 CAPSULE BY MOUTH WITH  FOOD DAILY AS NEEDED FOR PAIN, Disp: 90 capsule, Rfl: 3   irbesartan (AVAPRO) 300 MG tablet, Take 1 tablet (300 mg total) by mouth daily., Disp: 90 tablet, Rfl: 0   meloxicam (MOBIC) 15 MG tablet, Take 15 mg by mouth 2 (two) times daily. (Patient not taking: Reported on 06/22/2023), Disp: , Rfl:    metFORMIN (GLUCOPHAGE-XR) 500 MG 24  hr tablet, Take 1 tablet (500 mg total) by mouth at bedtime., Disp: 90 tablet, Rfl: 1   methocarbamol (ROBAXIN) 750 MG tablet, Take 750 mg by mouth 2 (two) times daily. (Patient not taking: Reported on 06/22/2023), Disp: , Rfl:    omeprazole (PRILOSEC) 20 MG capsule, TAKE 1 CAPSULE BY MOUTH DAILY (Patient not taking: Reported on 06/22/2023), Disp: 90 capsule, Rfl: 3   rosuvastatin (CRESTOR) 20 MG tablet, Take 1 tablet (20 mg total) by mouth every other day., Disp: 90 tablet, Rfl: 2   traMADol (ULTRAM) 50 MG tablet, Take 1 to 2 tablets nightly before bed as needed for pain. (Patient not taking: Reported on 06/22/2023), Disp: 30 tablet, Rfl: 0   Medications ordered in this encounter:  Meds ordered this encounter  Medications   Colchicine 0.6 MG CAPS    Sig: 1.2mg  po x1  then 0.6mg  an hour later; do not repeat treatment for at least 3 days    Dispense:  3 capsule    Refill:  1     *If you need refills on other medications prior to your next appointment, please contact your pharmacy*  Follow-Up: Call back or seek an in-person evaluation if the symptoms worsen or if the condition fails to improve as anticipated.  [facility] [phone]    If you have been instructed to have an in-person evaluation today at a local Urgent Care facility,  please use the link below. It will take you to a list of all of our available [facility], including address, phone number and hours of operation. Please do not delay care.  [facility]  If you or a family member do not have a primary care provider, use the link below to schedule a visit and establish care. When you choose a [facility] primary care physician or advanced practice provider, you gain a long-term partner in health. Find a Primary Care Provider  Learn more about [facility]'s in-office and virtual care options: [facility] - Get Care Now

## 2023-06-30 NOTE — Progress Notes (Signed)
Virtual Visit Consent   Eric Salas, you are scheduled for a virtual visit with a [employer] provider today. Just as with appointments in the office, your consent must be obtained to participate. Your consent will be active for this visit and any virtual visit you may have with one of our providers in the next 365 days. If you have a MyChart account, a copy of this consent can be sent to you electronically.  As this is a virtual visit, video technology does not allow for your provider to perform a traditional examination. This may limit your provider's ability to fully assess your condition. If your provider identifies any concerns that need to be evaluated in person or the need to arrange testing (such as labs, EKG, etc.), we will make arrangements to do so. Although advances in technology are sophisticated, we cannot ensure that it will always work on either your end or our end. If the connection with a video visit is poor, the visit may have to be switched to a telephone visit. With either a video or telephone visit, we are not always able to ensure that we have a secure connection.  By engaging in this virtual visit, you consent to the provision of healthcare and authorize for your insurance to be billed (if applicable) for the services provided during this visit. Depending on your insurance coverage, you may receive a charge related to this service.  I need to obtain your verbal consent now. Are you willing to proceed with your visit today? Kingston Meehan Murakami has provided verbal consent on 06/30/2023 for a virtual visit (video or telephone). Cathlyn Parsons, NP  Date: 06/30/2023 1:22 PM  Virtual Visit via Video Note   I, Cathlyn Parsons, connected with  Eric Salas  ([id number], [birth year]) on 06/30/23 at  1:00 PM EST by a video-enabled telemedicine application and verified that I am speaking with the correct person using two identifiers.  Location: Patient: Virtual Visit  Location Patient: Home Provider: Virtual Visit Location Provider: Home Office   I discussed the limitations of evaluation and management by telemedicine and the availability of in person appointments. The patient expressed understanding and agreed to proceed.    History of Present Illness: Eric Salas is a 67 y.o. who identifies as a male who was assigned male at birth, and is being seen today for gout flare. Had polish sausage on 06/28/23, woke up 12/13 with gout flare. Hasn't had one this bad in years. Is taking his allopurinol daily. Is considering vegetarian diet for gout prevention. 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   Nuclear sclerotic cataract of right eye 02/18/2020   Nuclear sclerotic cataract of left eye 02/18/2020   Hepatic steatosis 01/02/2019   Acute pancreatitis 12/31/2018   Achilles tendinitis of left lower extremity 06/28/2018   Elevated glucose 09/19/2017   Screen for colon cancer 09/04/2017  Healthcare maintenance 09/04/2017   Chronic pain of left knee 08/03/2017   Prepatellar bursitis, right knee 08/03/2017   Chronic joint pain 08/01/2017   Need for influenza vaccination 08/01/2017   Hypertension goal BP (blood pressure) < 140/80 06/04/2017   Elevated LDL cholesterol level 06/04/2017   Gout 06/04/2017    Allergies:  Allergies  Allergen Reactions   Codeine Itching    Medications:  Current Outpatient Medications:    Colchicine 0.6 MG CAPS, 1.2mg  po x1  then 0.6mg  an hour later; do not repeat treatment for at least 3 days, Disp: 3 capsule, Rfl: 1   allopurinol (ZYLOPRIM) 300 MG tablet, Take 1 tablet (300 mg total) by mouth daily., Disp: 90 tablet, Rfl: 3   ALPRAZolam (XANAX) 0.5 MG tablet, Take 45 minutes before procedure. (Patient not taking: Reported on 06/22/2023), Disp: 1 tablet, Rfl: 0   amLODipine (NORVASC) 10 MG tablet, Take 1 tablet (10 mg total) by mouth daily., Disp: 90 tablet, Rfl: 1   aspirin EC 81 MG tablet, Take 1 tablet (81 mg total) by mouth daily. Swallow whole., Disp: 90 tablet, Rfl: 3   diazepam (VALIUM) 5 MG tablet, Take 5 mg by mouth as needed. (Patient not taking: Reported on 06/22/2023), Disp: , Rfl:    ezetimibe (ZETIA) 10 MG tablet, Take 1 tablet (10 mg total) by mouth daily., Disp: 90 tablet, Rfl: 3   indomethacin (INDOCIN) 50 MG capsule, TAKE 1 CAPSULE BY MOUTH WITH  FOOD DAILY AS NEEDED FOR PAIN, Disp: 90 capsule, Rfl: 3   irbesartan (AVAPRO) 300 MG tablet, Take 1 tablet (300 mg total) by mouth daily., Disp: 90 tablet, Rfl: 0   meloxicam (MOBIC) 15 MG tablet, Take 15 mg by mouth 2 (two) times daily. (Patient not taking: Reported on 06/22/2023), Disp: , Rfl:    metFORMIN (GLUCOPHAGE-XR) 500 MG 24 hr tablet, Take 1 tablet (500 mg total) by mouth at bedtime., Disp: 90 tablet, Rfl: 1   methocarbamol (ROBAXIN) 750 MG tablet, Take 750 mg by mouth 2 (two) times daily. (Patient not taking: Reported on 06/22/2023), Disp: , Rfl:    omeprazole (PRILOSEC) 20 MG capsule, TAKE 1 CAPSULE BY MOUTH DAILY (Patient not taking: Reported on 06/22/2023), Disp: 90 capsule, Rfl: 3   rosuvastatin (CRESTOR) 20 MG tablet, Take 1 tablet (20 mg total) by mouth every other day., Disp: 90 tablet, Rfl: 2   traMADol (ULTRAM) 50 MG tablet, Take 1 to 2 tablets nightly before bed as needed for pain. (Patient not taking: Reported on 06/22/2023), Disp: 30 tablet, Rfl:  0  Observations/Objective: Patient is well-developed, well-nourished in no acute distress.  Resting comfortably  at home.  Head is normocephalic, atraumatic.  No labored breathing.  Speech is clear and coherent with logical content.  Patient is alert and oriented at baseline.    Assessment and Plan: 1. Acute gout of multiple sites, unspecified cause (Primary)  Pt requests colchicine. I have rx this with a refill. Discussed diet briefly  Follow Up Instructions: I discussed the assessment and treatment plan with the patient. The patient was provided an opportunity to ask questions and all were answered. The patient agreed with the plan and demonstrated an understanding of the instructions.  A copy of instructions were sent to the patient via MyChart unless otherwise noted below.   The patient was advised to call back or seek an in-person evaluation if the symptoms worsen or if the condition fails to improve as anticipated.    Cathlyn Parsons, NP

## 2023-07-14 DIAGNOSIS — G4733 Obstructive sleep apnea (adult) (pediatric): Secondary | ICD-10-CM | POA: Diagnosis not present

## 2023-07-15 NOTE — Progress Notes (Signed)
 PATIENT: Kiefer Opheim Gamel DOB: 13-May-1956  REASON FOR VISIT: follow up HISTORY FROM: patient  Virtual Visit via Telephone Note  I connected with Laster Appling Conant on 07/17/23 at  9:00 AM EST by telephone and verified that I am speaking with the correct person using two identifiers.   I discussed the limitations, risks, security and privacy concerns of performing an evaluation and management service by telephone and the availability of in person appointments. I also discussed with the patient that there may be a patient responsible charge related to this service. The patient expressed understanding and agreed to proceed.   History of Present Illness:  07/17/23 ALL: CHANDRA FEGER is a 67 y.o. male here today for follow up for OSA on CPAP. He was seen in consult with Dr Buck 07/2022 snoring and EDS. HST 04/2023 showed moderate obstructive sleep apnea with a total AHI of 16/hour and O2 nadir of 80%. AutoPAP advised. Since, he reports doing well. He is adjusting to his machine. Using a FFM. He is sleeping better. Feels more energized during the day. Not needing to nap. ESS previously 10, now 1.     History (copied from Dr Obie previous note)  Dear Tinnie,   I saw your patient, Bruk Tumolo, upon your kind request in my sleep clinic today for initial consultation of his sleep disorder, in particular, concern for underlying obstructive sleep apnea.  The patient is unaccompanied today.  As you know, Mr. Rabenold is a 67 year old male with an underlying medical history of hypertension, gout, hyperlipidemia, sinus tachycardia and overweight state, who reports snoring and excessive daytime somnolence as well as waking up with a sense of gasping for air.  His Epworth sleepiness score is 10 out of 24, fatigue severity score is 28 out of 63.  He is currently wearing a Zio patch, has completed almost 1 week.  I reviewed your office note from 07/14/2022. He was recently seen by  cardiology for palpitations on 07/26/22 and I reviewed the visit note. His EKG showed sinus bradycardia.  He was advised to pursue further workup.  He is going to complete his Zio patch next week.  He has a carotid Doppler scheduled for tomorrow and echocardiogram for next week.  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  He has done so for 12 years.  He lives with his wife, they have grown children, between the 2 of them they have 5 kids and 12 grandchildren.  He has 1 dog in the household and they have about 30 chickens.  He has woken up with a sense of gasping for air.  His wife also snores, he does have a TV in the bedroom but turns it off in the middle of the night.  He has difficulty maintaining sleep.  He has been very active, he goes to the gym every morning, 6 days a week and 1 day a week he rides his bike.  He does abdominal crunches every morning.  He drinks caffeine typically in the morning, 2 cups of coffee feet and very occasionally 1 or 2 cups in the evening.  He has lost weight, lost up to 40 pounds and gained some weight back.  No family history of sleep apnea.  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   Observations/Objective:  Generalized: Well developed, in no acute distress  Mentation: Alert oriented to time, place, history taking. Follows all commands speech  and language fluent   Assessment and Plan:  67 y.o. year old male  has a past medical history of Arthritis, Cataract, H/O rotator cuff surgery (2024), Hyperlipidemia, and Hypertension. here with    ICD-10-CM   1. OSA on CPAP  G47.33       Ojani is doing well with CPAP therapy. CPAP compliance report shows optimal usage. He was encouraged to continue using CPAP nightly for at least 4 hours. Healthy lifestyle habits advised. He will follow up with me in 1 year.   No orders of the defined types were placed in this encounter.   No orders of the defined types were  placed in this encounter.    Follow Up Instructions:  I discussed the assessment and treatment plan with the patient. The patient was provided an opportunity to ask questions and all were answered. The patient agreed with the plan and demonstrated an understanding of the instructions.   The patient was advised to call back or seek an in-person evaluation if the symptoms worsen or if the condition fails to improve as anticipated.  I provided 15 minutes of non-face-to-face time during this encounter. Patient located at their place of residence during Mychart visit. Provider is in the office.    Hiawatha Merriott, NP

## 2023-07-15 NOTE — Patient Instructions (Signed)
 Please continue using your CPAP regularly. While your insurance requires that you use CPAP at least 4 hours each night on 70% of the nights, I recommend, that you not skip any nights and use it throughout the night if you can. Getting used to CPAP and staying with the treatment long term does take time and patience and discipline. Untreated obstructive sleep apnea when it is moderate to severe can have an adverse impact on cardiovascular health and raise her risk for heart disease, arrhythmias, hypertension, congestive heart failure, stroke and diabetes. Untreated obstructive sleep apnea causes sleep disruption, nonrestorative sleep, and sleep deprivation. This can have an impact on your day to day functioning and cause daytime sleepiness and impairment of cognitive function, memory loss, mood disturbance, and problems focussing. Using CPAP regularly can improve these symptoms.  Contact the DME for any supply needs. Have a great New Year!   Follow up in 1 year

## 2023-07-16 ENCOUNTER — Encounter: Payer: Self-pay | Admitting: *Deleted

## 2023-07-17 ENCOUNTER — Encounter: Payer: Self-pay | Admitting: Family Medicine

## 2023-07-17 ENCOUNTER — Telehealth (INDEPENDENT_AMBULATORY_CARE_PROVIDER_SITE_OTHER): Payer: Medicare Other | Admitting: Family Medicine

## 2023-07-17 DIAGNOSIS — G4733 Obstructive sleep apnea (adult) (pediatric): Secondary | ICD-10-CM

## 2023-07-17 NOTE — Telephone Encounter (Signed)
Copy placed in chart.

## 2023-07-20 ENCOUNTER — Telehealth: Payer: Medicare Other | Admitting: Adult Health

## 2023-08-15 ENCOUNTER — Other Ambulatory Visit: Payer: Self-pay | Admitting: Family Medicine

## 2023-08-15 DIAGNOSIS — I10 Essential (primary) hypertension: Secondary | ICD-10-CM

## 2023-08-15 DIAGNOSIS — R7303 Prediabetes: Secondary | ICD-10-CM

## 2023-08-24 DIAGNOSIS — E785 Hyperlipidemia, unspecified: Secondary | ICD-10-CM | POA: Diagnosis not present

## 2023-08-24 DIAGNOSIS — Z008 Encounter for other general examination: Secondary | ICD-10-CM | POA: Diagnosis not present

## 2023-08-24 DIAGNOSIS — F17211 Nicotine dependence, cigarettes, in remission: Secondary | ICD-10-CM | POA: Diagnosis not present

## 2023-08-24 DIAGNOSIS — E669 Obesity, unspecified: Secondary | ICD-10-CM | POA: Diagnosis not present

## 2023-08-24 DIAGNOSIS — Z6832 Body mass index (BMI) 32.0-32.9, adult: Secondary | ICD-10-CM | POA: Diagnosis not present

## 2023-08-24 DIAGNOSIS — I10 Essential (primary) hypertension: Secondary | ICD-10-CM | POA: Diagnosis not present

## 2023-09-10 ENCOUNTER — Other Ambulatory Visit: Payer: Self-pay | Admitting: Family Medicine

## 2023-09-10 DIAGNOSIS — I10 Essential (primary) hypertension: Secondary | ICD-10-CM

## 2023-09-10 MED ORDER — ALLOPURINOL 300 MG PO TABS
300.0000 mg | ORAL_TABLET | Freq: Every day | ORAL | 0 refills | Status: DC
Start: 2023-09-10 — End: 2023-12-10

## 2023-09-10 MED ORDER — EZETIMIBE 10 MG PO TABS: 10.0000 mg | ORAL_TABLET | Freq: Every day | ORAL | 3 refills | Status: DC

## 2023-09-10 NOTE — Telephone Encounter (Signed)
 Last Fill: Zetia: 12/06/22     Allopurinol: 07/14/22  Last OV: 02/20/23 Next OV: 09/21/23  Routing to provider for review/authorization.

## 2023-09-10 NOTE — Telephone Encounter (Signed)
 Requesting: Allopurionol by Rodman Pickle, NP                      Ezetimibe 10mg  by Dr. Epifanio Lesches   Please Advise

## 2023-09-10 NOTE — Telephone Encounter (Signed)
 Copied from CRM [id number]. Topic: Clinical - Medication Refill >> Sep 10, 2023 11:28 AM Theodis Sato wrote: Most Recent Primary Care Visit:  Provider: [name]  Department: [facility]  Visit Type: OFFICE VISIT  Date: 02/20/2023  Medication: ezetimibe (ZETIA) 10 MG tablet allopurinol (ZYLOPRIM) 300 MG tablet  Has the patient contacted their pharmacy? No, prescriptions expired and patient is seeking clarification on if he still needs to be taking ezetimibe (ZETIA) 10 MG tablet (Agent: If no, request that the patient contact the pharmacy for the refill. If patient does not wish to contact the pharmacy document the reason why and proceed with request.) (Agent: If yes, when and what did the pharmacy advise?)  Is this the correct pharmacy for this prescription? Yes If no, delete pharmacy and type the correct one.  This is the patient's preferred pharmacy:  [facility] [address] [place], [place] - [address] [place] [address] Phone: [phone] Fax: [phone]   Has the prescription been filled recently? Yes  Is the patient out of the medication? No but will be by the end of the week.   Has the patient been seen for an appointment in the last year OR does the patient have an upcoming appointment? Yes  Can we respond through MyChart? Yes  Agent: Please be advised that Rx refills may take up to 3 business days. We ask that you follow-up with your pharmacy.

## 2023-09-11 DIAGNOSIS — G4733 Obstructive sleep apnea (adult) (pediatric): Secondary | ICD-10-CM | POA: Diagnosis not present

## 2023-09-17 ENCOUNTER — Telehealth: Payer: Self-pay

## 2023-09-17 ENCOUNTER — Other Ambulatory Visit: Payer: Self-pay | Admitting: Family Medicine

## 2023-09-17 DIAGNOSIS — R7303 Prediabetes: Secondary | ICD-10-CM

## 2023-09-17 DIAGNOSIS — I10 Essential (primary) hypertension: Secondary | ICD-10-CM

## 2023-09-17 DIAGNOSIS — E7800 Pure hypercholesterolemia, unspecified: Secondary | ICD-10-CM

## 2023-09-17 DIAGNOSIS — M109 Gout, unspecified: Secondary | ICD-10-CM

## 2023-09-17 MED ORDER — AMLODIPINE BESYLATE 10 MG PO TABS: 10.0000 mg | ORAL_TABLET | Freq: Every day | ORAL | 1 refills | Status: DC

## 2023-09-17 MED ORDER — METFORMIN HCL ER 500 MG PO TB24: 500.0000 mg | ORAL_TABLET | Freq: Every evening | ORAL | 1 refills | Status: DC

## 2023-09-17 MED ORDER — ROSUVASTATIN CALCIUM 20 MG PO TABS: 20.0000 mg | ORAL_TABLET | ORAL | 2 refills | Status: AC

## 2023-09-17 MED ORDER — INDOMETHACIN 50 MG PO CAPS: ORAL_CAPSULE | ORAL | 3 refills | Status: DC

## 2023-09-17 NOTE — Telephone Encounter (Signed)
 Last Fill: Allopurinol: 09/10/23- pharmacy states refill is needed      Zetia: 09/10/23-pharmacy will fill refills on this today  Spoke to [facility], confirmed Allopurinol is the only script needing renewal.  Routing to provider for review/authorization.   Copied from CRM [id number]. Topic: Clinical - Medication Refill >> Sep 10, 2023 11:28 AM Theodis Sato wrote: Most Recent Primary Care Visit:  Provider: [name]  Department: [facility]  Visit Type: OFFICE VISIT  Date: 02/20/2023  Medication: ezetimibe (ZETIA) 10 MG tablet allopurinol (ZYLOPRIM) 300 MG tablet  Has the patient contacted their pharmacy? No, prescriptions expired and patient is seeking clarification on if he still needs to be taking ezetimibe (ZETIA) 10 MG tablet (Agent: If no, request that the patient contact the pharmacy for the refill. If patient does not wish to contact the pharmacy document the reason why and proceed with request.) (Agent: If yes, when and what did the pharmacy advise?)  Is this the correct pharmacy for this prescription? Yes If no, delete pharmacy and type the correct one.  This is the patient's preferred pharmacy:  [facility] [address] [place], [place] - [address] [place] [address] Phone: [phone] Fax: [phone]   Has the prescription been filled recently? Yes  Is the patient out of the medication? No but will be by the end of the week.   Has the patient been seen for an appointment in the last year OR does the patient have an upcoming appointment? Yes  Can we respond through MyChart? Yes  Agent: Please be advised that Rx refills may take up to 3 business days. We ask that you follow-up with your pharmacy.

## 2023-09-17 NOTE — Telephone Encounter (Signed)
 Copied from CRM [id number]. Topic: Appointments - Appointment Info/Confirmation >> Sep 17, 2023 12:40 PM Eric Salas wrote: Patient/patient representative is calling for information regarding an appointment. Patient called checking on an appointment and he stated that [facility] is calling about a ax that is not getting a response from his doctor regarding his medication refill  Spoke to patient and informed him that RX was sent to [facility] as requested and to maintain upcoming appointment with Dr. Doreene Burke MD on 09/21/2023. Pt verbalized understanding and a thank you.

## 2023-09-20 ENCOUNTER — Encounter: Payer: Self-pay | Admitting: Family Medicine

## 2023-09-20 NOTE — Progress Notes (Signed)
 Called pt and completed pre-charting for Welcome to Medicare visit with PCP 09/21/23

## 2023-09-21 ENCOUNTER — Ambulatory Visit: Payer: Medicare Other | Admitting: Family Medicine

## 2023-09-21 ENCOUNTER — Encounter: Payer: Self-pay | Admitting: Family Medicine

## 2023-09-21 VITALS — BP 132/78 | HR 60 | Temp 97.6°F | Ht 70.0 in | Wt 214.8 lb

## 2023-09-21 DIAGNOSIS — Z0000 Encounter for general adult medical examination without abnormal findings: Secondary | ICD-10-CM

## 2023-09-21 NOTE — Progress Notes (Signed)
 Annual Wellness Visit     Patient: Eric Salas, Male    DOB: September 12, 1955, 68 y.o.   MRN: [id number]  Subjective  Chief Complaint  Patient presents with   Medicare Wellness    Welcome to Medicare    [name] is a 68 y.o. male who presents today for his Annual Wellness Visit. He reports consuming a general diet. Home exercise routine includes walking 5 hrs per week. He generally feels well. He reports sleeping well. He does not have additional problems to discuss today.   HPI Feels great. Has been released after shoulder surgery. Can now work out in [employer] and play golf.       Medications: Outpatient Medications Prior to Visit  Medication Sig   allopurinol (ZYLOPRIM) 300 MG tablet Take 1 tablet (300 mg total) by mouth daily.   amLODipine (NORVASC) 10 MG tablet Take 1 tablet (10 mg total) by mouth daily.   aspirin EC 81 MG tablet Take 1 tablet (81 mg total) by mouth daily. Swallow whole.   ezetimibe (ZETIA) 10 MG tablet Take 1 tablet (10 mg total) by mouth daily.   indomethacin (INDOCIN) 50 MG capsule TAKE 1 CAPSULE BY MOUTH WITH  FOOD DAILY AS NEEDED FOR PAIN   irbesartan (AVAPRO) 300 MG tablet Take 1 tablet by mouth once daily   metFORMIN (GLUCOPHAGE-XR) 500 MG 24 hr tablet Take 1 tablet (500 mg total) by mouth at bedtime.   rosuvastatin (CRESTOR) 20 MG tablet Take 1 tablet (20 mg total) by mouth every other day.   ALPRAZolam (XANAX) 0.5 MG tablet Take 45 minutes before procedure. (Patient not taking: Reported on 09/20/2023)   Colchicine 0.6 MG CAPS 1.2mg  po x1  then 0.6mg  an hour later; do not repeat treatment for at least 3 days (Patient not taking: Reported on 09/20/2023)   diazepam (VALIUM) 5 MG tablet Take 5 mg by mouth as needed. (Patient not taking: Reported on 09/20/2023)   meloxicam (MOBIC) 15 MG tablet Take 15 mg by mouth 2 (two) times daily. (Patient not taking: Reported on 09/20/2023)   methocarbamol (ROBAXIN) 750 MG tablet Take 750 mg by mouth 2 (two)  times daily. (Patient not taking: Reported on 09/20/2023)   omeprazole (PRILOSEC) 20 MG capsule TAKE 1 CAPSULE BY MOUTH DAILY (Patient not taking: Reported on 09/20/2023)   traMADol (ULTRAM) 50 MG tablet Take 1 to 2 tablets nightly before bed as needed for pain. (Patient not taking: Reported on 09/20/2023)   No facility-administered medications prior to visit.    Allergies  Allergen Reactions   Codeine Itching    Patient Care Team: [name], MD as PCP - General (Family Medicine) Little Ishikawa, MD as PCP - Cardiology (Cardiology)  Review of Systems  Constitutional: Negative.   HENT: Negative.    Eyes:  Negative for blurred vision, discharge and redness.  Respiratory: Negative.    Cardiovascular: Negative.   Gastrointestinal:  Negative for abdominal pain.  Genitourinary: Negative.   Musculoskeletal: Negative.  Negative for myalgias.  Skin:  Negative for rash.  Neurological:  Negative for tingling, loss of consciousness and weakness.  Endo/Heme/Allergies:  Negative for polydipsia.        Objective  BP 132/78   Pulse 60   Temp 97.6 F (36.4 C)   Ht 5\' 10"  (1.778 m)   Wt 214 lb 12.8 oz (97.4 kg)   SpO2 97%   BMI 30.82 kg/m    Physical Exam Constitutional:  General: He is not in acute distress.    Appearance: Normal appearance. He is not ill-appearing, toxic-appearing or diaphoretic.  HENT:     Head: Normocephalic and atraumatic.     Right Ear: External ear normal.     Left Ear: External ear normal.  Eyes:     General: No scleral icterus.       Right eye: No discharge.        Left eye: No discharge.     Extraocular Movements: Extraocular movements intact.     Conjunctiva/sclera: Conjunctivae normal.  Pulmonary:     Effort: Pulmonary effort is normal. No respiratory distress.  Skin:    General: Skin is warm and dry.  Neurological:     Mental Status: He is alert and oriented to person, place, and time.  Psychiatric:        Mood and  Affect: Mood normal.        Behavior: Behavior normal.       Most recent functional status assessment:    09/21/2023    9:59 AM  In your present state of health, do you have any difficulty performing the following activities:  Hearing? 0  Vision? 0   Most recent fall risk assessment:    09/20/2023    2:17 PM  Fall Risk   Falls in the past year? 1  Number falls in past yr: 0  Injury with Fall? 1  Risk for fall due to : History of fall(s)    Most recent depression screenings:    11/24/2022    2:31 PM 11/08/2022    8:17 AM  PHQ 2/9 Scores  PHQ - 2 Score 0 0   Most recent cognitive screening:    09/21/2023    9:59 AM  6CIT Screen  Repeat phrase 0 points   Most recent Audit-C alcohol use screening    09/20/2023    2:30 PM  Alcohol Use Disorder Test (AUDIT)  1. How often do you have a drink containing alcohol? 0  3. How often do you have six or more drinks on one occasion? 0   A score of 3 or more in women, and 4 or more in men indicates increased risk for alcohol abuse, EXCEPT if all of the points are from question 1   Vision/Hearing Screen: Hearing Screening   125Hz  250Hz  500Hz  1000Hz  2000Hz  3000Hz   Right ear Pass Pass Pass Pass Pass Pass  Left ear Fail Fail Fail Pass Pass Pass   Vision Screening   Right eye Left eye Both eyes  Without correction 20/25 20/30 20/20   With correction         No results found for any visits on 09/21/23.    Assessment & Plan   Annual wellness visit done today including the all of the following: Reviewed patient's Family Medical History Reviewed and updated list of patient's medical providers Assessment of cognitive impairment was done Assessed patient's functional ability Established a written schedule for health screening services Health Risk Assessent Completed and Reviewed  Exercise Activities and Dietary recommendations  Goals      Improve or maintain quality of life     Patient is on track. Patient will maintain  adherence  Pt states he wants to remain healthy.      Laboratory improvement        Immunization History  Administered Date(s) Administered   Influenza Split 10/29/2012   Influenza,inj,Quad PF,6+ Mos 06/04/2017, 05/12/2020, 04/06/2021, 04/12/2022   Influenza,inj,Quad PF,6-35 Mos 05/12/2020   Influenza,inj,quad,  With Preservative 05/01/2019   Influenza-Unspecified 04/19/2015, 04/17/2019   Moderna Sars-Covid-2 Vaccination 07/19/2019, 08/25/2019, 06/16/2020   Tdap 04/22/2013   Zoster Recombinant(Shingrix) 10/07/2021, 11/08/2022    Health Maintenance  Topic Date Due   Pneumonia Vaccine 18+ Years old (1 of 2 - PCV) Never done   INFLUENZA VACCINE  02/15/2023   DTaP/Tdap/Td (2 - Td or Tdap) 04/23/2023   Colonoscopy  11/24/2023 (Originally 11/13/2022)   Medicare Annual Wellness (AWV)  09/20/2024   Hepatitis C Screening  Completed   Zoster Vaccines- Shingrix  Completed   HPV VACCINES  Aged Out   COVID-19 Vaccine  Discontinued     Discussed health benefits of physical activity, and encouraged him to engage in regular exercise appropriate for his age and condition.    Problem List Items Addressed This Visit   None Visit Diagnoses       Welcome to Medicare preventive visit    -  Primary   Relevant Orders   EKG 12-Lead (Completed)     Wellness examination           Return For scheduled follow up.[name], MD

## 2023-09-24 ENCOUNTER — Ambulatory Visit (INDEPENDENT_AMBULATORY_CARE_PROVIDER_SITE_OTHER): Admitting: Family Medicine

## 2023-09-24 ENCOUNTER — Encounter: Payer: Self-pay | Admitting: Family Medicine

## 2023-09-24 ENCOUNTER — Ambulatory Visit: Payer: Self-pay | Admitting: Family Medicine

## 2023-09-24 VITALS — BP 134/76 | HR 77 | Temp 100.4°F | Resp 18 | Wt 213.6 lb

## 2023-09-24 DIAGNOSIS — H6993 Unspecified Eustachian tube disorder, bilateral: Secondary | ICD-10-CM | POA: Insufficient documentation

## 2023-09-24 DIAGNOSIS — J069 Acute upper respiratory infection, unspecified: Secondary | ICD-10-CM | POA: Insufficient documentation

## 2023-09-24 DIAGNOSIS — U071 COVID-19: Secondary | ICD-10-CM | POA: Insufficient documentation

## 2023-09-24 LAB — POCT INFLUENZA A/B
Influenza A, POC: NEGATIVE
Influenza B, POC: NEGATIVE

## 2023-09-24 LAB — POC COVID19 BINAXNOW: SARS Coronavirus 2 Ag: POSITIVE — AB

## 2023-09-24 MED ORDER — PREDNISONE 10 MG PO TABS: 10.0000 mg | ORAL_TABLET | Freq: Two times a day (BID) | ORAL | 0 refills | Status: AC

## 2023-09-24 MED ORDER — NIRMATRELVIR/RITONAVIR (PAXLOVID)TABLET: 3.0000 | ORAL_TABLET | Freq: Two times a day (BID) | ORAL | 0 refills | Status: AC

## 2023-09-24 NOTE — Telephone Encounter (Signed)
 Copied from CRM [id number]. Topic: Clinical - Red Word Triage >> Sep 24, 2023  8:43 AM [place] A wrote: [facility] that prompted transfer to Nurse Triage: Patient has fever 100.4,chills,body aches  Chief Complaint: cold Symptoms: body aches, head pressure, nasal& chest congestion, fever 100.4, cough, sputum, Frequency: last night Pertinent Negatives: Patient denies SOB Disposition: [] ED /[] Urgent Care (no appt availability in office) / [x] Appointment(In office/virtual)/ []  [facility]/ [] Home Care/ [] Refused Recommended Disposition /[] [facility]/ []  Follow-up with PCP Additional Notes: pt stated had a rough night due to fever, chills and body aches.    Reason for Disposition  [1] Nasal discharge AND [2] present > 10 days  Answer Assessment - Initial Assessment Questions 1. LOCATION: "Where does it hurt?"      Body aches, head pressure 2. ONSET: "When did the sinus pain start?"  (e.g., hours, days)      Last night 3. SEVERITY: "How bad is the pain?"   (Scale 1-10; mild, moderate or severe)   - MILD (1-3): doesn't interfere with normal activities    - MODERATE (4-7): interferes with normal activities (e.g., work or school) or awakens from sleep   - SEVERE (8-10): excruciating pain and patient unable to do any normal activities        moderate 4. RECURRENT SYMPTOM: "Have you ever had sinus problems before?" If Yes, ask: "When was the last time?" and "What happened that time?"      yes 5. NASAL CONGESTION: "Is the nose blocked?" If Yes, ask: "Can you open it or must you breathe through your mouth?"     Nasal & chest congestion, body aches, fever, sputum-yellow 6. NASAL DISCHARGE: "Do you have discharge from your nose?" If so ask, "What color?"     yellow 7. FEVER: "Do you have a fever?" If Yes, ask: "What is it, how was it measured, and when did it start?"      100.4 8. OTHER SYMPTOMS: "Do you have any other symptoms?" (e.g., sore throat, cough, earache, difficulty  breathing)     Cough, 9. PREGNANCY: "Is there any chance you are pregnant?" "When was your last menstrual period?"     N/a  Protocols used: Sinus Pain or Congestion-A-AH

## 2023-09-24 NOTE — Progress Notes (Unsigned)
 Established Patient Office Visit   Subjective:  Patient ID: Eric Salas, male    DOB: 1956-04-23  Age: 68 y.o. MRN: [id number]  Chief Complaint  Patient presents with   flu like symptoms     Pt C/O of cough, congestion, runny nose, chills, fever, ear pressure and joint pain for 2 days.    HPI Encounter Diagnoses  Name Primary?   Upper respiratory tract infection, unspecified type Yes   COVID    Dysfunction of both eustachian tubes    1 day history of fevers chills, fatigue malaise, postnasal drip, mild cough, denies wheezing but has noticed some tightness in the chest.  No headache or sore throat.  No nausea or vomiting.  Positive arthralgias without myalgias {History (Optional):23778}  Review of Systems  Constitutional:  Positive for chills, fever and malaise/fatigue.  HENT:  Positive for congestion and hearing loss. Negative for sore throat.   Eyes:  Negative for blurred vision, discharge and redness.  Respiratory:  Positive for cough and wheezing. Negative for shortness of breath.   Cardiovascular: Negative.   Gastrointestinal:  Negative for abdominal pain.  Genitourinary: Negative.   Musculoskeletal:  Positive for joint pain. Negative for myalgias.  Skin:  Negative for rash.  Neurological:  Negative for dizziness, tingling, loss of consciousness, weakness and headaches.  Endo/Heme/Allergies:  Negative for polydipsia.     Current Outpatient Medications:    allopurinol (ZYLOPRIM) 300 MG tablet, Take 1 tablet (300 mg total) by mouth daily., Disp: 90 tablet, Rfl: 0   ALPRAZolam (XANAX) 0.5 MG tablet, Take 45 minutes before procedure., Disp: 1 tablet, Rfl: 0   amLODipine (NORVASC) 10 MG tablet, Take 1 tablet (10 mg total) by mouth daily., Disp: 90 tablet, Rfl: 1   aspirin EC 81 MG tablet, Take 1 tablet (81 mg total) by mouth daily. Swallow whole., Disp: 90 tablet, Rfl: 3   Colchicine 0.6 MG CAPS, 1.2mg  po x1  then 0.6mg  an hour later; do not repeat treatment for at  least 3 days, Disp: 3 capsule, Rfl: 1   diazepam (VALIUM) 5 MG tablet, Take 5 mg by mouth as needed., Disp: , Rfl:    ezetimibe (ZETIA) 10 MG tablet, Take 1 tablet (10 mg total) by mouth daily., Disp: 90 tablet, Rfl: 3   flunisolide (NASALIDE) 25 MCG/ACT (0.025%) SOLN, 2 sprays 2 (two) times daily., Disp: , Rfl:    hydroxychloroquine (PLAQUENIL) 200 MG tablet, TAKE 2 TABLETS BY MOUTH ONCE DAILY AS DIRECTED for 30, Disp: , Rfl:    indomethacin (INDOCIN) 50 MG capsule, TAKE 1 CAPSULE BY MOUTH WITH  FOOD DAILY AS NEEDED FOR PAIN, Disp: 90 capsule, Rfl: 3   irbesartan (AVAPRO) 300 MG tablet, Take 1 tablet by mouth once daily, Disp: 90 tablet, Rfl: 0   meloxicam (MOBIC) 15 MG tablet, Take 15 mg by mouth 2 (two) times daily., Disp: , Rfl:    metFORMIN (GLUCOPHAGE-XR) 500 MG 24 hr tablet, Take 1 tablet (500 mg total) by mouth at bedtime., Disp: 90 tablet, Rfl: 1   methocarbamol (ROBAXIN) 750 MG tablet, Take 750 mg by mouth 2 (two) times daily., Disp: , Rfl:    nirmatrelvir/ritonavir (PAXLOVID) 20 x 150 MG & 10 x 100MG  TABS, Take 3 tablets by mouth 2 (two) times daily for 5 days. (Take nirmatrelvir 150 mg two tablets twice daily for 5 days and ritonavir 100 mg one tablet twice daily for 5 days) Patient GFR is 89, Disp: 30 tablet, Rfl: 0   omeprazole (PRILOSEC) 20  MG capsule, TAKE 1 CAPSULE BY MOUTH DAILY, Disp: 90 capsule, Rfl: 3   predniSONE (DELTASONE) 10 MG tablet, Take 1 tablet (10 mg total) by mouth 2 (two) times daily with a meal for 7 days., Disp: 14 tablet, Rfl: 0   rosuvastatin (CRESTOR) 20 MG tablet, Take 1 tablet (20 mg total) by mouth every other day., Disp: 90 tablet, Rfl: 2   traMADol (ULTRAM) 50 MG tablet, Take 1 to 2 tablets nightly before bed as needed for pain., Disp: 30 tablet, Rfl: 0   Objective:     BP 134/76 (BP Location: Left Arm, Patient Position: Sitting, Cuff Size: Normal)   Pulse 77   Temp (!) 100.4 F (38 C) (Oral)   Resp 18   Wt 213 lb 9.6 oz (96.9 kg)   SpO2 97%   BMI  30.65 kg/m  {Vitals History (Optional):23777}  Physical Exam Constitutional:      General: He is not in acute distress.    Appearance: Normal appearance. He is not ill-appearing, toxic-appearing or diaphoretic.  HENT:     Head: Normocephalic and atraumatic.     Right Ear: External ear normal. No middle ear effusion. Tympanic membrane is retracted. Tympanic membrane is not erythematous.     Left Ear: External ear normal.  No middle ear effusion. Tympanic membrane is retracted. Tympanic membrane is not erythematous.     Mouth/Throat:     Mouth: Mucous membranes are moist.     Pharynx: Oropharynx is clear. No oropharyngeal exudate or posterior oropharyngeal erythema.  Eyes:     General: No scleral icterus.       Right eye: No discharge.        Left eye: No discharge.     Extraocular Movements: Extraocular movements intact.     Conjunctiva/sclera: Conjunctivae normal.     Pupils: Pupils are equal, round, and reactive to light.  Cardiovascular:     Rate and Rhythm: Normal rate and regular rhythm.  Pulmonary:     Effort: Pulmonary effort is normal. No respiratory distress.     Breath sounds: Normal breath sounds. No wheezing, rhonchi or rales.  Abdominal:     General: Bowel sounds are normal.  Musculoskeletal:     Cervical back: No rigidity or tenderness.  Lymphadenopathy:     Cervical: No cervical adenopathy.  Skin:    General: Skin is warm and dry.     Findings: No rash.  Neurological:     Mental Status: He is alert and oriented to person, place, and time.  Psychiatric:        Mood and Affect: Mood normal.        Behavior: Behavior normal.      Results for orders placed or performed in visit on 09/24/23  POC COVID-19 BinaxNow  Result Value Ref Range   SARS Coronavirus 2 Ag Positive (A) Negative  POCT Influenza A/B  Result Value Ref Range   Influenza A, POC Negative Negative   Influenza B, POC Negative Negative    {Labs (Optional):23779}  The ASCVD Risk score  (Arnett DK, et al., 2019) failed to calculate for the following reasons:   The valid total cholesterol range is 130 to 320 mg/dL    Assessment & Plan:   Upper respiratory tract infection, unspecified type -     POC COVID-19 BinaxNow -     POCT Influenza A/B  COVID -     nirmatrelvir/ritonavir; Take 3 tablets by mouth 2 (two) times daily for 5 days. (Take nirmatrelvir  150 mg two tablets twice daily for 5 days and ritonavir 100 mg one tablet twice daily for 5 days) Patient GFR is 89  Dispense: 30 tablet; Refill: 0  Dysfunction of both eustachian tubes -     predniSONE; Take 1 tablet (10 mg total) by mouth 2 (two) times daily with a meal for 7 days.  Dispense: 14 tablet; Refill: 0    Return in about 1 week (around 10/01/2023), or if symptoms worsen or fail to improve.  Out of work through Friday.  Use salt water nose spray.  Paxlovid.  Advised to hold colchicine and rosuvastatin.  Prednisone 10 mg twice daily for ETD.  Follow-up 1 week if not improving.  Information on COVID-19 and Paxlovid given.  [name], MD

## 2023-09-25 ENCOUNTER — Encounter: Payer: Self-pay | Admitting: Family Medicine

## 2023-10-08 DIAGNOSIS — H2512 Age-related nuclear cataract, left eye: Secondary | ICD-10-CM | POA: Diagnosis not present

## 2023-10-08 DIAGNOSIS — H2511 Age-related nuclear cataract, right eye: Secondary | ICD-10-CM | POA: Diagnosis not present

## 2023-10-08 DIAGNOSIS — D3131 Benign neoplasm of right choroid: Secondary | ICD-10-CM | POA: Diagnosis not present

## 2023-10-08 DIAGNOSIS — H02833 Dermatochalasis of right eye, unspecified eyelid: Secondary | ICD-10-CM | POA: Diagnosis not present

## 2023-10-08 DIAGNOSIS — H2513 Age-related nuclear cataract, bilateral: Secondary | ICD-10-CM | POA: Diagnosis not present

## 2023-10-08 DIAGNOSIS — H02836 Dermatochalasis of left eye, unspecified eyelid: Secondary | ICD-10-CM | POA: Diagnosis not present

## 2023-11-04 ENCOUNTER — Other Ambulatory Visit: Payer: Self-pay | Admitting: Family Medicine

## 2023-11-04 DIAGNOSIS — R7303 Prediabetes: Secondary | ICD-10-CM

## 2023-11-27 ENCOUNTER — Encounter: Payer: Self-pay | Admitting: Family Medicine

## 2023-11-27 ENCOUNTER — Ambulatory Visit (INDEPENDENT_AMBULATORY_CARE_PROVIDER_SITE_OTHER): Admitting: Family Medicine

## 2023-11-27 VITALS — BP 118/82 | HR 70 | Temp 97.4°F | Ht 70.0 in | Wt 202.4 lb

## 2023-11-27 DIAGNOSIS — Z1211 Encounter for screening for malignant neoplasm of colon: Secondary | ICD-10-CM

## 2023-11-27 DIAGNOSIS — K861 Other chronic pancreatitis: Secondary | ICD-10-CM | POA: Diagnosis not present

## 2023-11-27 DIAGNOSIS — R1013 Epigastric pain: Secondary | ICD-10-CM | POA: Insufficient documentation

## 2023-11-27 MED ORDER — OMEPRAZOLE 20 MG PO CPDR
20.0000 mg | DELAYED_RELEASE_CAPSULE | Freq: Every day | ORAL | 3 refills | Status: AC
Start: 2023-11-27 — End: ?

## 2023-11-27 NOTE — Progress Notes (Signed)
 Established Patient Office Visit   Subjective:  Patient ID: Eric Salas, male    DOB: 07-30-1955  Age: 68 y.o. MRN: [id number]  Chief Complaint  Patient presents with   Abdominal Pain    Epigastric pain x 2 weeks. Pt feels like when he eats his food just sits there. Pt also had some constipation that is now resolved. No regurgitation or heartburn. Pt did try to take Prilosec for 4 days and no improved.     Abdominal Pain Associated symptoms include constipation. Pertinent negatives include no diarrhea, melena, myalgias, nausea or vomiting.   Encounter Diagnoses  Name Primary?   Screening for colon cancer Yes   Epigastric pain    2-week history of midepigastric pain that is slowly improving.  It had been associated with constipation and seem to lessen after he took 2 doses of MiraLAX and had a bowel movement.  Prilosec for 3 days has not helped.  There is no exertional component, shortness of breath or chest pain.  Pain is worse after eating.   Review of Systems  Constitutional: Negative.   HENT: Negative.    Eyes:  Negative for blurred vision, discharge and redness.  Respiratory: Negative.  Negative for shortness of breath.   Cardiovascular: Negative.  Negative for chest pain.  Gastrointestinal:  Positive for abdominal pain and constipation. Negative for blood in stool, diarrhea, heartburn, melena, nausea and vomiting.  Genitourinary: Negative.   Musculoskeletal: Negative.  Negative for myalgias.  Skin:  Negative for rash.  Neurological:  Negative for tingling, loss of consciousness and weakness.  Endo/Heme/Allergies:  Negative for polydipsia.     Current Outpatient Medications:    allopurinol  (ZYLOPRIM ) 300 MG tablet, Take 1 tablet (300 mg total) by mouth daily., Disp: 90 tablet, Rfl: 0   amLODipine  (NORVASC ) 10 MG tablet, Take 1 tablet (10 mg total) by mouth daily., Disp: 90 tablet, Rfl: 1   aspirin  EC 81 MG tablet, Take 1 tablet (81 mg total) by mouth daily.  Swallow whole., Disp: 90 tablet, Rfl: 3   Colchicine  0.6 MG CAPS, 1.2mg  po x1  then 0.6mg  an hour later; do not repeat treatment for at least 3 days, Disp: 3 capsule, Rfl: 1   ezetimibe  (ZETIA ) 10 MG tablet, Take 1 tablet (10 mg total) by mouth daily., Disp: 90 tablet, Rfl: 3   flunisolide  (NASALIDE ) 25 MCG/ACT (0.025%) SOLN, 2 sprays 2 (two) times daily., Disp: , Rfl:    indomethacin  (INDOCIN ) 50 MG capsule, TAKE 1 CAPSULE BY MOUTH WITH  FOOD DAILY AS NEEDED FOR PAIN, Disp: 90 capsule, Rfl: 3   irbesartan  (AVAPRO ) 300 MG tablet, Take 1 tablet by mouth once daily, Disp: 90 tablet, Rfl: 0   metFORMIN  (GLUCOPHAGE -XR) 500 MG 24 hr tablet, Take 1 tablet (500 mg total) by mouth at bedtime., Disp: 90 tablet, Rfl: 1   omeprazole  (PRILOSEC) 20 MG capsule, TAKE 1 CAPSULE BY MOUTH DAILY, Disp: 90 capsule, Rfl: 3   rosuvastatin  (CRESTOR ) 20 MG tablet, Take 1 tablet (20 mg total) by mouth every other day., Disp: 90 tablet, Rfl: 2   hydroxychloroquine (PLAQUENIL) 200 MG tablet, TAKE 2 TABLETS BY MOUTH ONCE DAILY AS DIRECTED for 30 (Patient not taking: Reported on 11/27/2023), Disp: , Rfl:    Objective:     BP 118/82 (Cuff Size: Normal)   Pulse 70   Temp (!) 97.4 F (36.3 C) (Temporal)   Ht 5\' 10"  (1.778 m)   Wt 202 lb 6.4 oz (91.8 kg)   SpO2 97%  BMI 29.04 kg/m    Physical Exam Constitutional:      General: He is not in acute distress.    Appearance: Normal appearance. He is not ill-appearing, toxic-appearing or diaphoretic.  HENT:     Head: Normocephalic and atraumatic.     Right Ear: External ear normal.     Left Ear: External ear normal.     Mouth/Throat:     Mouth: Mucous membranes are moist.     Pharynx: Oropharynx is clear. No oropharyngeal exudate or posterior oropharyngeal erythema.  Eyes:     General: No scleral icterus.       Right eye: No discharge.        Left eye: No discharge.     Extraocular Movements: Extraocular movements intact.     Conjunctiva/sclera: Conjunctivae  normal.     Pupils: Pupils are equal, round, and reactive to light.  Cardiovascular:     Rate and Rhythm: Normal rate and regular rhythm.  Pulmonary:     Effort: Pulmonary effort is normal. No respiratory distress.     Breath sounds: Normal breath sounds.  Abdominal:     General: Abdomen is flat. Bowel sounds are normal. There is no distension.     Palpations: Abdomen is soft.     Tenderness: There is no abdominal tenderness. There is no right CVA tenderness, left CVA tenderness, guarding or rebound.  Musculoskeletal:     Cervical back: No rigidity or tenderness.  Skin:    General: Skin is warm and dry.  Neurological:     Mental Status: He is alert and oriented to person, place, and time.  Psychiatric:        Mood and Affect: Mood normal.        Behavior: Behavior normal.      No results found for any visits on 11/27/23.    The ASCVD Risk score (Arnett DK, et al., 2019) failed to calculate for the following reasons:   The valid total cholesterol range is 130 to 320 mg/dL    Assessment & Plan:   Screening for colon cancer -     Ambulatory referral to Gastroenterology  Epigastric pain -     CBC -     Comprehensive metabolic panel with GFR -     Amylase -     Lipase -     Troponin I - -     Urinalysis, Routine w reflex microscopic    Return in about 2 weeks (around 12/11/2023), or Take MiraLAX and Prilosec daily.Tonna Frederic, MD

## 2023-11-28 LAB — CBC
HCT: 41.1 % (ref 39.0–52.0)
Hemoglobin: 13.8 g/dL (ref 13.0–17.0)
MCHC: 33.6 g/dL (ref 30.0–36.0)
MCV: 91.9 fl (ref 78.0–100.0)
Platelets: 183 10*3/uL (ref 150.0–400.0)
RBC: 4.47 Mil/uL (ref 4.22–5.81)
RDW: 13.6 % (ref 11.5–15.5)
WBC: 5.9 10*3/uL (ref 4.0–10.5)

## 2023-11-28 LAB — URINALYSIS, ROUTINE W REFLEX MICROSCOPIC
Bilirubin Urine: NEGATIVE
Hgb urine dipstick: NEGATIVE
Ketones, ur: NEGATIVE
Leukocytes,Ua: NEGATIVE
Nitrite: NEGATIVE
RBC / HPF: NONE SEEN (ref 0–?)
Specific Gravity, Urine: <=1.005 — AB (ref 1.000–1.030)
Total Protein, Urine: NEGATIVE
Urine Glucose: NEGATIVE
Urobilinogen, UA: 0.2 (ref 0.0–1.0)
pH: 6.5 (ref 5.0–8.0)

## 2023-11-28 LAB — COMPREHENSIVE METABOLIC PANEL WITH GFR
ALT: 23 U/L (ref 0–53)
AST: 21 U/L (ref 0–37)
Albumin: 4.3 g/dL (ref 3.5–5.2)
Alkaline Phosphatase: 66 U/L (ref 39–117)
BUN: 14 mg/dL (ref 6–23)
CO2: 28 meq/L (ref 19–32)
Calcium: 9.1 mg/dL (ref 8.4–10.5)
Chloride: 104 meq/L (ref 96–112)
Creatinine, Ser: 0.92 mg/dL (ref 0.40–1.50)
GFR: 86.39 mL/min (ref >60.00)
Glucose, Bld: 77 mg/dL (ref 70–99)
Potassium: 3.7 meq/L (ref 3.5–5.1)
Sodium: 140 meq/L (ref 135–145)
Total Bilirubin: 0.4 mg/dL (ref 0.2–1.2)
Total Protein: 6.8 g/dL (ref 6.0–8.3)

## 2023-11-28 LAB — TROPONIN I: Troponin I: 10 ng/L (ref <=47)

## 2023-11-28 LAB — LIPASE: Lipase: 91 U/L — ABNORMAL HIGH (ref 11.0–59.0)

## 2023-11-28 LAB — AMYLASE: Amylase: 50 U/L (ref 27–131)

## 2023-11-29 ENCOUNTER — Other Ambulatory Visit: Payer: Self-pay

## 2023-11-29 ENCOUNTER — Ambulatory Visit: Payer: Self-pay | Admitting: Family Medicine

## 2023-11-29 ENCOUNTER — Encounter: Payer: Self-pay | Admitting: Family Medicine

## 2023-11-29 DIAGNOSIS — I10 Essential (primary) hypertension: Secondary | ICD-10-CM

## 2023-11-29 DIAGNOSIS — R7303 Prediabetes: Secondary | ICD-10-CM

## 2023-11-29 MED ORDER — IRBESARTAN 300 MG PO TABS: 300.0000 mg | ORAL_TABLET | Freq: Every day | ORAL | 0 refills | Status: DC

## 2023-11-29 NOTE — Addendum Note (Signed)
 Addended by: Delene Feinstein on: 11/29/2023 08:06 AM   Modules accepted: Orders

## 2023-11-30 ENCOUNTER — Other Ambulatory Visit: Payer: Self-pay | Admitting: Family Medicine

## 2023-11-30 ENCOUNTER — Telehealth: Payer: Self-pay | Admitting: Family Medicine

## 2023-11-30 DIAGNOSIS — I10 Essential (primary) hypertension: Secondary | ICD-10-CM

## 2023-11-30 DIAGNOSIS — K862 Cyst of pancreas: Secondary | ICD-10-CM

## 2023-11-30 DIAGNOSIS — K861 Other chronic pancreatitis: Secondary | ICD-10-CM

## 2023-11-30 DIAGNOSIS — R7303 Prediabetes: Secondary | ICD-10-CM

## 2023-11-30 NOTE — Telephone Encounter (Signed)
 Copied from CRM [id number]. Topic: Referral - Question >> Nov 30, 2023  8:12 AM Kita Perish H wrote: Reason for CRM: Patient was calling to check if Dr. Tilmon Font sent in referral for an MRI and [facility], advised patient referrals were pending and patient stated last time he had an MRI insurance sent him to [facility] because it was cheaper than [facility]. If referral could be sent there and states Fridays work best for him when scheduling.  Siegfried Dress [phone]

## 2023-12-05 ENCOUNTER — Encounter: Payer: Self-pay | Admitting: Cardiology

## 2023-12-07 ENCOUNTER — Telehealth: Payer: Self-pay | Admitting: Family Medicine

## 2023-12-07 NOTE — Telephone Encounter (Unsigned)
 Copied from CRM [id number]. Topic: Referral - Status >> Dec 07, 2023 11:29 AM Danae Duncans wrote: Reason for CRM: Pt advise he has not heard anything from the referrals , pt would like to be contacted in regards to the referrals [phone]

## 2023-12-10 ENCOUNTER — Other Ambulatory Visit: Payer: Self-pay | Admitting: Family Medicine

## 2023-12-10 DIAGNOSIS — I10 Essential (primary) hypertension: Secondary | ICD-10-CM

## 2023-12-11 NOTE — Addendum Note (Signed)
 Addended by: Delene Feinstein on: 12/11/2023 09:58 AM   Modules accepted: Orders

## 2023-12-13 ENCOUNTER — Encounter: Payer: Self-pay | Admitting: Physician Assistant

## 2023-12-17 NOTE — Addendum Note (Signed)
 Addended by: Delene Feinstein on: 12/17/2023 08:59 AM   Modules accepted: Orders

## 2023-12-18 ENCOUNTER — Other Ambulatory Visit: Payer: Self-pay | Admitting: Family Medicine

## 2023-12-18 DIAGNOSIS — K861 Other chronic pancreatitis: Secondary | ICD-10-CM

## 2023-12-28 ENCOUNTER — Encounter: Payer: Self-pay | Admitting: Family Medicine

## 2023-12-28 ENCOUNTER — Ambulatory Visit (INDEPENDENT_AMBULATORY_CARE_PROVIDER_SITE_OTHER): Admitting: Family Medicine

## 2023-12-28 VITALS — BP 110/64 | HR 61 | Temp 96.8°F | Ht 70.0 in | Wt 196.6 lb

## 2023-12-28 DIAGNOSIS — K861 Other chronic pancreatitis: Secondary | ICD-10-CM | POA: Diagnosis not present

## 2023-12-28 DIAGNOSIS — I10 Essential (primary) hypertension: Secondary | ICD-10-CM | POA: Diagnosis not present

## 2023-12-28 DIAGNOSIS — M7041 Prepatellar bursitis, right knee: Secondary | ICD-10-CM

## 2023-12-28 DIAGNOSIS — E7800 Pure hypercholesterolemia, unspecified: Secondary | ICD-10-CM | POA: Diagnosis not present

## 2023-12-28 DIAGNOSIS — Z125 Encounter for screening for malignant neoplasm of prostate: Secondary | ICD-10-CM

## 2023-12-28 DIAGNOSIS — R7303 Prediabetes: Secondary | ICD-10-CM

## 2023-12-28 DIAGNOSIS — Z0000 Encounter for general adult medical examination without abnormal findings: Secondary | ICD-10-CM

## 2023-12-28 DIAGNOSIS — Z23 Encounter for immunization: Secondary | ICD-10-CM | POA: Diagnosis not present

## 2023-12-28 LAB — COMPREHENSIVE METABOLIC PANEL WITH GFR
ALT: 14 U/L (ref 0–53)
AST: 19 U/L (ref 0–37)
Albumin: 4.5 g/dL (ref 3.5–5.2)
Alkaline Phosphatase: 65 U/L (ref 39–117)
BUN: 18 mg/dL (ref 6–23)
CO2: 30 meq/L (ref 19–32)
Calcium: 9.3 mg/dL (ref 8.4–10.5)
Chloride: 103 meq/L (ref 96–112)
Creatinine, Ser: 0.94 mg/dL (ref 0.40–1.50)
GFR: 84.14 mL/min (ref >60.00)
Glucose, Bld: 91 mg/dL (ref 70–99)
Potassium: 3.8 meq/L (ref 3.5–5.1)
Sodium: 140 meq/L (ref 135–145)
Total Bilirubin: 0.6 mg/dL (ref 0.2–1.2)
Total Protein: 6.9 g/dL (ref 6.0–8.3)

## 2023-12-28 LAB — CBC
HCT: 41.4 % (ref 39.0–52.0)
Hemoglobin: 13.9 g/dL (ref 13.0–17.0)
MCHC: 33.6 g/dL (ref 30.0–36.0)
MCV: 90.9 fl (ref 78.0–100.0)
Platelets: 164 10*3/uL (ref 150.0–400.0)
RBC: 4.56 Mil/uL (ref 4.22–5.81)
RDW: 14.8 % (ref 11.5–15.5)
WBC: 5.8 10*3/uL (ref 4.0–10.5)

## 2023-12-28 LAB — URINALYSIS, ROUTINE W REFLEX MICROSCOPIC
Bilirubin Urine: NEGATIVE
Ketones, ur: NEGATIVE
Leukocytes,Ua: NEGATIVE
Nitrite: NEGATIVE
Specific Gravity, Urine: 1.02 (ref 1.000–1.030)
Urine Glucose: NEGATIVE
Urobilinogen, UA: 1 (ref 0.0–1.0)
pH: 6 (ref 5.0–8.0)

## 2023-12-28 LAB — AMYLASE: Amylase: 36 U/L (ref 27–131)

## 2023-12-28 LAB — HEMOGLOBIN A1C: Hgb A1c MFr Bld: 6.1 % (ref 4.6–6.5)

## 2023-12-28 LAB — LIPID PANEL
Cholesterol: 119 mg/dL (ref 0–200)
HDL: 49.6 mg/dL (ref >39.00)
LDL Cholesterol: 60 mg/dL (ref 0–99)
NonHDL: 69.58
Total CHOL/HDL Ratio: 2
Triglycerides: 49 mg/dL (ref 0.0–149.0)
VLDL: 9.8 mg/dL (ref 0.0–40.0)

## 2023-12-28 LAB — PSA: PSA: 2.22 ng/mL (ref 0.10–4.00)

## 2023-12-28 LAB — LIPASE: Lipase: 30 U/L (ref 11.0–59.0)

## 2023-12-28 MED ORDER — ALPRAZOLAM 0.25 MG PO TABS: ORAL_TABLET | ORAL | 0 refills | Status: AC

## 2023-12-28 NOTE — Progress Notes (Signed)
 Established Patient Office Visit   Subjective:  Patient ID: Eric Salas, male    DOB: 1956-04-05  Age: 68 y.o. MRN: [id number]  Chief Complaint  Patient presents with   Annual Exam    Patient is fasting  Wants to talk about pancreatic problem has a mri coming up Also right knee is bothering him painful when walking    HPI Encounter Diagnoses  Name Primary?   Healthcare maintenance Yes   Prediabetes    HTN (hypertension), benign    Immunization due    Prepatellar bursitis, right knee    Chronic recurrent pancreatitis (HCC)    Screening for prostate cancer    Elevated LDL cholesterol level    For physical exam and follow-up of above.  Remains active physically.  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  Consultation for chronic pancreatitis is pending.  History of hepatic steatosis.  Increased red meat in the diet.  He does not drink alcohol.   Review of Systems  Constitutional: Negative.   HENT: Negative.    Eyes:  Negative for blurred vision, discharge and redness.  Respiratory: Negative.    Cardiovascular: Negative.   Gastrointestinal:  Negative for abdominal pain.  Genitourinary: Negative.   Musculoskeletal: Negative.  Negative for myalgias.  Skin:  Negative for rash.  Neurological:  Negative for tingling, loss of consciousness and weakness.  Endo/Heme/Allergies:  Negative for polydipsia.     Current Outpatient Medications:    allopurinol  (ZYLOPRIM ) 300 MG tablet, Take 1 tablet by mouth once daily, Disp: 90 tablet, Rfl: 0   ALPRAZolam  (XANAX ) 0.25 MG tablet, Take 1 tablet 30 minutes prior to MRI., Disp: 1 tablet, Rfl: 0   amLODipine  (NORVASC ) 10 MG tablet, Take 1 tablet (10 mg total) by mouth daily., Disp: 90 tablet, Rfl: 1   aspirin  EC 81 MG tablet, Take 1 tablet (81 mg total) by mouth daily. Swallow whole., Disp: 90 tablet, Rfl: 3   Colchicine  0.6 MG CAPS, 1.2mg  po x1  then 0.6mg  an hour later; do not repeat treatment for at least 3 days,  Disp: 3 capsule, Rfl: 1   flunisolide  (NASALIDE ) 25 MCG/ACT (0.025%) SOLN, 2 sprays 2 (two) times daily., Disp: , Rfl:    hydroxychloroquine (PLAQUENIL) 200 MG tablet, TAKE 2 TABLETS BY MOUTH ONCE DAILY AS DIRECTED for 30, Disp: , Rfl:    indomethacin  (INDOCIN ) 50 MG capsule, TAKE 1 CAPSULE BY MOUTH WITH  FOOD DAILY AS NEEDED FOR PAIN, Disp: 90 capsule, Rfl: 3   irbesartan  (AVAPRO ) 300 MG tablet, Take 1 tablet (300 mg total) by mouth daily., Disp: 90 tablet, Rfl: 0   metFORMIN  (GLUCOPHAGE -XR) 500 MG 24 hr tablet, Take 1 tablet (500 mg total) by mouth at bedtime., Disp: 90 tablet, Rfl: 1   omeprazole  (PRILOSEC) 20 MG capsule, Take 1 capsule (20 mg total) by mouth daily., Disp: 90 capsule, Rfl: 3   rosuvastatin  (CRESTOR ) 20 MG tablet, Take 1 tablet (20 mg total) by mouth every other day., Disp: 90 tablet, Rfl: 2   ezetimibe  (ZETIA ) 10 MG tablet, Take 1 tablet (10 mg total) by mouth daily. (Patient not taking: Reported on 12/28/2023), Disp: 90 tablet, Rfl: 3   Objective:     BP 110/64 (BP Location: Left Arm, Patient Position: Sitting, Cuff Size: Normal)   Pulse 61   Temp (!) 96.8 F (36 C) (Temporal)   Ht 5' 10 (1.778 m)   Wt 196 lb 9.6 oz (89.2 kg)   SpO2 98%   BMI 28.21  kg/m    Physical Exam Constitutional:      General: He is not in acute distress.    Appearance: Normal appearance. He is not ill-appearing, toxic-appearing or diaphoretic.  HENT:     Head: Normocephalic and atraumatic.     Right Ear: Tympanic membrane, ear canal and external ear normal.     Left Ear: Tympanic membrane, ear canal and external ear normal.     Mouth/Throat:     Mouth: Mucous membranes are moist.     Pharynx: Oropharynx is clear. No oropharyngeal exudate or posterior oropharyngeal erythema.   Eyes:     General: No scleral icterus.       Right eye: No discharge.        Left eye: No discharge.     Extraocular Movements: Extraocular movements intact.     Conjunctiva/sclera: Conjunctivae normal.      Pupils: Pupils are equal, round, and reactive to light.    Cardiovascular:     Rate and Rhythm: Normal rate and regular rhythm.  Pulmonary:     Effort: Pulmonary effort is normal. No respiratory distress.     Breath sounds: Normal breath sounds.  Abdominal:     General: Bowel sounds are normal.     Tenderness: There is no abdominal tenderness. There is no guarding.     Hernia: There is no hernia in the left inguinal area or right inguinal area.  Genitourinary:    Penis: Uncircumcised. No phimosis, paraphimosis, hypospadias, erythema, tenderness, discharge, swelling or lesions.      Testes:        Right: Mass, tenderness or swelling not present. Right testis is descended.        Left: Mass, tenderness or swelling not present. Left testis is descended.     Epididymis:     Right: Not inflamed or enlarged.     Left: Not inflamed or enlarged.   Musculoskeletal:     Cervical back: No rigidity or tenderness.  Lymphadenopathy:     Lower Body: No right inguinal adenopathy. No left inguinal adenopathy.   Skin:    General: Skin is warm and dry.   Neurological:     Mental Status: He is alert and oriented to person, place, and time.   Psychiatric:        Mood and Affect: Mood normal.        Behavior: Behavior normal.      No results found for any visits on 12/28/23.    The ASCVD Risk score (Arnett DK, et al., 2019) failed to calculate for the following reasons:   The valid total cholesterol range is 130 to 320 mg/dL    Assessment & Plan:   Healthcare maintenance  Prediabetes -     Comprehensive metabolic panel with GFR -     Hemoglobin A1c  HTN (hypertension), benign -     CBC -     Comprehensive metabolic panel with GFR -     Urinalysis, Routine w reflex microscopic  Immunization due -     Pneumococcal conjugate vaccine 20-valent  Prepatellar bursitis, right knee -     Ambulatory referral to Orthopedic Surgery  Chronic recurrent pancreatitis (HCC) -      Amylase -     Lipase -     ALPRAZolam ; Take 1 tablet 30 minutes prior to MRI.  Dispense: 1 tablet; Refill: 0  Screening for prostate cancer -     PSA  Elevated LDL cholesterol level -     Comprehensive  metabolic panel with GFR -     Lipid panel    Return in about 1 year (around 12/27/2024), or if symptoms worsen or fail to improve.  Continue healthy active lifestyle.  Tdap through pharmacy.  Will give me the name of an orthopedic surgeon for excision of his right prepatellar bursa.  Consultation with GI pending for chronic pancreatitis.  Avoid weight gain and moderate intake of simple carbohydrates and avoid all sugary drinks.  Tonna Frederic, MD

## 2023-12-31 ENCOUNTER — Ambulatory Visit: Payer: Self-pay | Admitting: Family Medicine

## 2024-01-05 DIAGNOSIS — G4733 Obstructive sleep apnea (adult) (pediatric): Secondary | ICD-10-CM | POA: Diagnosis not present

## 2024-01-21 ENCOUNTER — Encounter: Payer: Self-pay | Admitting: Family Medicine

## 2024-01-25 DIAGNOSIS — M25561 Pain in right knee: Secondary | ICD-10-CM | POA: Diagnosis not present

## 2024-01-30 ENCOUNTER — Inpatient Hospital Stay
Admission: RE | Admit: 2024-01-30 | Discharge: 2024-01-30 | Disposition: A | Discharge status: Home / Self Care | Source: Ambulatory Visit | Attending: Family Medicine | Admitting: Family Medicine

## 2024-01-30 ENCOUNTER — Ambulatory Visit
Admission: RE | Admit: 2024-01-30 | Discharge: 2024-01-30 | Disposition: A | Discharge status: Home / Self Care | Source: Ambulatory Visit | Attending: Family Medicine | Admitting: Family Medicine

## 2024-01-30 DIAGNOSIS — K861 Other chronic pancreatitis: Secondary | ICD-10-CM

## 2024-01-30 DIAGNOSIS — Z03823 Encounter for observation for suspected inserted (injected) foreign body ruled out: Secondary | ICD-10-CM | POA: Diagnosis not present

## 2024-01-30 MED ORDER — GADOPICLENOL 0.5 MMOL/ML IV SOLN
10.0000 mL | Freq: Once | INTRAVENOUS | Status: AC | PRN
  Administered 2024-01-30: 10 mL via INTRAVENOUS

## 2024-02-01 ENCOUNTER — Encounter: Payer: Self-pay | Admitting: Physician Assistant

## 2024-02-01 ENCOUNTER — Ambulatory Visit: Payer: Self-pay | Admitting: Physician Assistant

## 2024-02-01 ENCOUNTER — Other Ambulatory Visit (INDEPENDENT_AMBULATORY_CARE_PROVIDER_SITE_OTHER)

## 2024-02-01 ENCOUNTER — Ambulatory Visit (INDEPENDENT_AMBULATORY_CARE_PROVIDER_SITE_OTHER): Admitting: Physician Assistant

## 2024-02-01 ENCOUNTER — Ambulatory Visit: Payer: Self-pay | Admitting: Family Medicine

## 2024-02-01 VITALS — BP 140/68 | HR 53 | Ht 70.0 in | Wt 201.0 lb

## 2024-02-01 DIAGNOSIS — Z860101 Personal history of adenomatous and serrated colon polyps: Secondary | ICD-10-CM | POA: Diagnosis not present

## 2024-02-01 DIAGNOSIS — R002 Palpitations: Secondary | ICD-10-CM

## 2024-02-01 DIAGNOSIS — K861 Other chronic pancreatitis: Secondary | ICD-10-CM

## 2024-02-01 DIAGNOSIS — D378 Neoplasm of uncertain behavior of other specified digestive organs: Secondary | ICD-10-CM | POA: Diagnosis not present

## 2024-02-01 DIAGNOSIS — K862 Cyst of pancreas: Secondary | ICD-10-CM | POA: Diagnosis not present

## 2024-02-01 LAB — CBC WITH DIFFERENTIAL/PLATELET
Basophils Absolute: 0.1 K/uL (ref 0.0–0.1)
Basophils Relative: 1 % (ref 0.0–3.0)
Eosinophils Absolute: 0.2 K/uL (ref 0.0–0.7)
Eosinophils Relative: 3.9 % (ref 0.0–5.0)
HCT: 44.9 % (ref 39.0–52.0)
Hemoglobin: 14.6 g/dL (ref 13.0–17.0)
Lymphocytes Relative: 38.9 % (ref 12.0–46.0)
Lymphs Abs: 2.2 K/uL (ref 0.7–4.0)
MCHC: 32.6 g/dL (ref 30.0–36.0)
MCV: 92.6 fl (ref 78.0–100.0)
Monocytes Absolute: 0.4 K/uL (ref 0.1–1.0)
Monocytes Relative: 6.5 % (ref 3.0–12.0)
Neutro Abs: 2.8 K/uL (ref 1.4–7.7)
Neutrophils Relative %: 49.7 % (ref 43.0–77.0)
Platelets: 142 K/uL — ABNORMAL LOW (ref 150.0–400.0)
RBC: 4.85 Mil/uL (ref 4.22–5.81)
RDW: 14.3 % (ref 11.5–15.5)
WBC: 5.7 K/uL (ref 4.0–10.5)

## 2024-02-01 LAB — TRIGLYCERIDES: Triglycerides: 96 mg/dL (ref 0.0–149.0)

## 2024-02-01 LAB — BASIC METABOLIC PANEL WITH GFR
BUN: 16 mg/dL (ref 6–23)
CO2: 31 meq/L (ref 19–32)
Calcium: 9.4 mg/dL (ref 8.4–10.5)
Chloride: 104 meq/L (ref 96–112)
Creatinine, Ser: 0.93 mg/dL (ref 0.40–1.50)
GFR: 85.17 mL/min (ref >60.00)
Glucose, Bld: 109 mg/dL — ABNORMAL HIGH (ref 70–99)
Potassium: 4.3 meq/L (ref 3.5–5.1)
Sodium: 142 meq/L (ref 135–145)

## 2024-02-01 LAB — HEPATIC FUNCTION PANEL
ALT: 21 U/L (ref 0–53)
AST: 20 U/L (ref 0–37)
Albumin: 4.6 g/dL (ref 3.5–5.2)
Alkaline Phosphatase: 66 U/L (ref 39–117)
Bilirubin, Direct: 0.1 mg/dL (ref 0.0–0.3)
Total Bilirubin: 0.6 mg/dL (ref 0.2–1.2)
Total Protein: 7.2 g/dL (ref 6.0–8.3)

## 2024-02-01 LAB — LIPASE: Lipase: 25 U/L (ref 11.0–59.0)

## 2024-02-01 LAB — AMYLASE: Amylase: 34 U/L (ref 27–131)

## 2024-02-01 MED ORDER — NA SULFATE-K SULFATE-MG SULF 17.5-3.13-1.6 GM/177ML PO SOLN: 1.0000 | Freq: Once | ORAL | 0 refills | Status: AC

## 2024-02-01 NOTE — Patient Instructions (Addendum)
 Your provider has requested that you go to the basement level for lab work before leaving today. Press B on the elevator. The lab is located at the first door on the left as you exit the elevator.  You have been scheduled for an endoscopy and colonoscopy. Please follow the written instructions given to you at your visit today.  If you use inhalers (even only as needed), please bring them with you on the day of your procedure.  DO NOT TAKE 7 DAYS PRIOR TO TEST- Trulicity (dulaglutide) Ozempic, Wegovy (semaglutide) Mounjaro (tirzepatide) Bydureon Bcise (exanatide extended release)  DO NOT TAKE 1 DAY PRIOR TO YOUR TEST Rybelsus (semaglutide) Adlyxin (lixisenatide) Victoza (liraglutide) Byetta (exanatide) ___________________________________________________________________________  REMEMBER TO HOLD METFORMIN  THE MORNING OF YOUR PROCEDURE AS WRITTEN IN ON YOUR INSTRUCTIONS   Due to recent changes in healthcare laws, you may see the results of your imaging and laboratory studies on MyChart before your provider has had a chance to review them.  We understand that in some cases there may be results that are confusing or concerning to you. Not all laboratory results come back in the same time frame and the provider may be waiting for multiple results in order to interpret others.  Please give us  48 hours in order for your provider to thoroughly review all the results before contacting the office for clarification of your results.    Silent reflux: Not all heartburn burns...SABRASABRASABRA  What is LPR? Laryngopharyngeal reflux (LPR) or silent reflux is a condition in which acid that is made in the stomach travels up the esophagus (swallowing tube) and gets to the throat. Not everyone with reflux has a lot of heartburn or indigestion. In fact, many people with LPR never have heartburn. This is why LPR is called SILENT REFLUX, and the terms Silent reflux and LPR are often used interchangeably. Because LPR  is silent, it is sometimes difficult to diagnose.  How can you tell if you have LPR?  Chronic hoarseness- Some people have hoarseness that comes and goes throat clearing  Cough It can cause shortness of breath and cause asthma like symptoms. a feeling of a lump in the throat  difficulty swallowing a problem with too much nose and throat drainage.  Some people will feel their esophagus spasm which feels like their heart beating hard and fast, this will usually be after a meal, at rest, or lying down at night.    How do I treat this? Treatment for LPR should be individualized, and your doctor will suggest the best treatment for you. Generally there are several treatments for LPR: changing habits and diet to reduce reflux,  medications to reduce stomach acid, and  surgery to prevent reflux. Most people with LPR need to modify how and when they eat, as well as take some medication, to get well. Sometimes, nonprescription liquid antacids, such as Maalox, Gelucil and Mylanta are recommended. When used, these antacids should be taken four times each day - one tablespoon one hour after each meal and before bedtime. Dietary and lifestyle changes alone are not often enough to control LPR - medications that reduce stomach acid are also usually needed. These must be prescribed by our doctor.   TIPS FOR REDUCING REFLUX AND LPR Control your LIFE-STYLE and your DIET! If you use tobacco, QUIT.  Smoking makes you reflux. After every cigarette you have some LPR.  Don't wear clothing that is too tight, especially around the waist (trousers, corsets, belts).  Do not lie down  just after eating...in fact, do not eat within three hours of bedtime.  You should be on a low-fat diet.  Limit your intake of red meat.  Limit your intake of butter.  Avoid fried foods.  Avoid chocolate  Avoid cheese.  Avoid eggs. Specifically avoid caffeine (especially coffee and tea), soda pop (especially cola) and mints.   Avoid alcoholic beverages, particularly in the evening. I appreciate the  opportunity to care for you  Thank You   [facility]

## 2024-02-01 NOTE — Progress Notes (Signed)
 02/01/2024 Masashi Snowdon Kruckenberg [id number] Aug 01, 1955  Referring provider: [name],* Primary GI doctor: 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  Had episode 2 to 3 months ago of epigastric discomfort unknown if this was acute pancreatitis versus GERD versus muscular - Will repeat lipase, amylase, c-Met, CA 19-9 - Will discuss with Dr. Eulene. Mansouraty about the pancreatic ductal dilation and if EUS or other imaging needs to be pursued to evaluate for possible pancreatic ductal stone - Could consider HIDA scan possible cholecystectomy  Palpitations lasts for mins, no associated symptoms, some mild GERD Worse with sitting, lying in bed 01/2023 stress test low risk 08/2022 Zio path 3 second pause, 10 episodes of SVT Did a trial of prilosec without help, denies GERD, dysphagia, symptoms of LPR, was doing AB crunches - Possible LPR will plan on EGD with colonoscopy to evaluate further, consider barium swallow, I discussed risks of EGD with patient today, including risk of sedation, bleeding or perforation.  Patient provides understanding and gave verbal consent to proceed. - If unremarkable possible musculoskeletal try Salonpas patches stop abdominal crunches - Follow back up with cardiology, previous SVT  consider repeat Zio patch - Get back on CPAP, stay hydrated, taught vagal maneuver  Personal history of colon polyps 11/01/2017 colonoscopy [name] good prep grade 2 hemorrhoids, 4 mm TA polyp, diverticula recall 5 years Will schedule at [facility] with [name] We have discussed the risks of bleeding, infection, perforation, medication reactions, and remote risk of death associated with colonoscopy. All questions were answered and the patient acknowledges these risk and wishes to proceed.  Patient Care Team: [name], MD as PCP - General (Family Medicine) Kate Lonni CROME, MD as PCP - Cardiology (Cardiology)  HISTORY OF PRESENT ILLNESS: 68 y.o. male with a past medical history listed below presents for evaluation of elevated LFTs and colonoscopy discussion.   Last seen in the office via telemedicine 01/24/2019 for acute pancreatitis. Unknown etiology . No alcohol, gallstones, abn LFTs, trauma, FH of pancreatitis, cocaine use, use of medications (except for ACE) associated with pancreatitis, hypercalcemia, intake of any herbal medications or previous history of pancreatitis.  Nl CBC, CMP, amylase, TG, CA19-9, CEA and AFP 12/2018   Patient presents to office with history of acute pancreatitis in 2020, without known etiology.  States about 2 to 3 months ago patient had some epigastric fullness it felt like food was sitting there with some discomfort, belching burping.  Patient denies at the time fever, chills, yellowing of eyes or skin, this lasted for several days with some associated palpitations.  Patient also states during this episode he may have had slightly looser stools, brown denies any melena hematochezia or pale stools.  Patient normally has bowel movement every day. Patient also describes intermittent frequent daily palpitations that has had for a long time can be worse with lying down, it is not exertional and patient  has no accompaniments.  Denies any shortness of breath  chest pain.  Patient has no real GERD symptoms possible LPR, denies dysphagia.  Denies NSAIDs, alcohol.   He is very active with his grandkids, has 12.   He  reports that he quit smoking about 18 years ago. His smoking use included cigarettes. He has never used smokeless tobacco. He reports that he does not currently use alcohol. He reports that he does not use drugs.  RELEVANT GI HISTORY, IMAGING AND LABS: Results          CBC    Component Value Date/Time   WBC 5.8 12/28/2023 0900   RBC 4.56 12/28/2023 0900   HGB 13.9 12/28/2023 0900   HCT 41.4 12/28/2023 0900   PLT 164.0 12/28/2023 0900   MCV 90.9 12/28/2023 0900   MCH 31.0 01/03/2019 0800   MCHC 33.6 12/28/2023 0900   RDW 14.8 12/28/2023 0900   LYMPHSABS 2.1 01/03/2019 0800   MONOABS 0.6 01/03/2019 0800   EOSABS 0.3 01/03/2019 0800   BASOSABS 0.1 01/03/2019 0800   Recent Labs    11/27/23 1517 12/28/23 0900  HGB 13.8 13.9    CMP     Component Value Date/Time   NA 140 12/28/2023 0900   NA 141 06/22/2023 0856   K 3.8 12/28/2023 0900   CL 103 12/28/2023 0900   CO2 30 12/28/2023 0900   GLUCOSE 91 12/28/2023 0900   BUN 18 12/28/2023 0900   BUN 20 06/22/2023 0856   CREATININE 0.94 12/28/2023 0900   CALCIUM  9.3 12/28/2023 0900   PROT 6.9 12/28/2023 0900   ALBUMIN 4.5 12/28/2023 0900   AST 19 12/28/2023 0900   ALT 14 12/28/2023 0900   ALKPHOS 65 12/28/2023 0900   BILITOT 0.6 12/28/2023 0900   GFRNONAA >60 01/03/2019 0800   GFRAA >60 01/03/2019 0800      Latest Ref Rng & Units 12/28/2023    9:00 AM 11/27/2023    3:17 PM 11/08/2022    9:08 AM  Hepatic Function  Total Protein 6.0 - 8.3 g/dL 6.9  6.8  6.7   Albumin 3.5 - 5.2 g/dL 4.5  4.3  4.3   AST 0 - 37 U/L 19  21  26    ALT 0 - 53 U/L 14  23  22    Alk Phosphatase 39 - 117 U/L 65  66  51   Total Bilirubin 0.2 - 1.2 mg/dL 0.6  0.4  0.7   Bilirubin, Direct 0.0 - 0.3 mg/dL   0.1       Current Medications:   Current Outpatient Medications (Endocrine &  Metabolic):    metFORMIN  (GLUCOPHAGE -XR) 500 MG 24 hr tablet, Take 1 tablet (500 mg total) by mouth at bedtime.  Current Outpatient Medications (Cardiovascular):    amLODipine  (NORVASC ) 10 MG tablet, Take 1 tablet (10 mg total) by mouth daily.   ezetimibe  (ZETIA ) 10 MG tablet, Take 1 tablet (10 mg total) by mouth daily.   irbesartan  (AVAPRO ) 300 MG tablet, Take 1 tablet (300 mg total) by mouth daily.   rosuvastatin  (CRESTOR ) 20 MG tablet, Take 1 tablet (20 mg total) by mouth every other day.  Current Outpatient Medications (Respiratory):    flunisolide  (NASALIDE ) 25 MCG/ACT (0.025%) SOLN, 2 sprays 2 (two) times daily.  Current Outpatient Medications (Analgesics):    allopurinol  (ZYLOPRIM ) 300 MG tablet, Take 1 tablet by mouth once daily   aspirin  EC 81 MG tablet, Take 1 tablet (81 mg total) by mouth daily. Swallow whole.  Colchicine  0.6 MG CAPS, 1.2mg  po x1  then 0.6mg  an hour later; do not repeat treatment for at least 3 days   indomethacin  (INDOCIN ) 50 MG capsule, TAKE 1 CAPSULE BY MOUTH WITH  FOOD DAILY AS NEEDED FOR PAIN   Current Outpatient Medications (Other):    ALPRAZolam  (XANAX ) 0.25 MG tablet, Take 1 tablet 30 minutes prior to MRI.   hydroxychloroquine (PLAQUENIL) 200 MG tablet, TAKE 2 TABLETS BY MOUTH ONCE DAILY AS DIRECTED for 30   Na Sulfate-K Sulfate-Mg Sulfate concentrate (SUPREP BOWEL PREP KIT) 17.5-3.13-1.6 GM/177ML SOLN, Take 1 kit (354 mLs total) by mouth once for 1 dose.   omeprazole  (PRILOSEC) 20 MG capsule, Take 1 capsule (20 mg total) by mouth daily.  Medical History:  Past Medical History:  Diagnosis Date   Arthritis    Cataract    lt. eye beginning stage   H/O rotator cuff surgery 2024   Right Shoulder   Hyperlipidemia    Hypertension    Allergies:  Allergies  Allergen Reactions   Codeine Itching     Surgical History:  He  has a past surgical history that includes Colonoscopy and Shoulder arthroscopy (Right, 01/26/2023). Family History:  His  family history includes Alcohol abuse in his father; Cancer in his brother and mother; Colon cancer in his brother; Diabetes in his brother, brother, and father; Drug abuse in his brother; Hearing loss in his brother; Heart attack in his father; Heart disease in his father; High Cholesterol in his brother, brother, and father; High blood pressure in his father; Miscarriages / Stillbirths in his mother; Stomach cancer in his brother; Stroke in his father.  REVIEW OF SYSTEMS  : All other systems reviewed and negative except where noted in the History of Present Illness.  PHYSICAL EXAM: BP (!) 140/68   Pulse (!) 53   Ht 5' 10 (1.778 m)   Wt 201 lb (91.2 kg)   BMI 28.84 kg/m  General Appearance: Well nourished, in no apparent distress. Respiratory: Respiratory effort normal, BS equal bilaterally without rales, rhonchi, wheezing. Cardio: RRR with no MRGs. Abdomen: Soft,  Non-distended ,active bowel sounds. mild tenderness in the epigastrium. Without guarding and Without rebound. No masses. Rectal: Not evaluated Musculoskeletal: Full ROM, Normal gait Neuro: Alert and  oriented x4;  No focal deficits. Psych:  Cooperative. Normal mood and affect.    Alan JONELLE Coombs, PA-C 12:09 PM

## 2024-02-04 DIAGNOSIS — G4733 Obstructive sleep apnea (adult) (pediatric): Secondary | ICD-10-CM | POA: Diagnosis not present

## 2024-02-04 LAB — CANCER ANTIGEN 19-9: CA 19-9: 13 U/mL (ref <34)

## 2024-02-04 NOTE — Addendum Note (Signed)
 Addended by: CRAIG PALMA on: 02/04/2024 10:10 AM   Modules accepted: Orders

## 2024-02-22 ENCOUNTER — Other Ambulatory Visit: Payer: Self-pay | Admitting: Family Medicine

## 2024-02-22 DIAGNOSIS — R7303 Prediabetes: Secondary | ICD-10-CM

## 2024-02-22 DIAGNOSIS — I10 Essential (primary) hypertension: Secondary | ICD-10-CM

## 2024-02-23 ENCOUNTER — Other Ambulatory Visit: Payer: Self-pay | Admitting: Family Medicine

## 2024-02-23 DIAGNOSIS — I10 Essential (primary) hypertension: Secondary | ICD-10-CM

## 2024-02-23 DIAGNOSIS — R7303 Prediabetes: Secondary | ICD-10-CM

## 2024-03-06 DIAGNOSIS — G4733 Obstructive sleep apnea (adult) (pediatric): Secondary | ICD-10-CM | POA: Diagnosis not present

## 2024-03-26 ENCOUNTER — Encounter: Payer: Self-pay | Admitting: Gastroenterology

## 2024-04-03 ENCOUNTER — Ambulatory Visit (AMBULATORY_SURGERY_CENTER): Admitting: Gastroenterology

## 2024-04-03 ENCOUNTER — Encounter: Payer: Self-pay | Admitting: Gastroenterology

## 2024-04-03 ENCOUNTER — Encounter: Payer: Self-pay | Admitting: Neurology

## 2024-04-03 VITALS — BP 137/67 | HR 59 | Temp 97.7°F | Resp 11 | Ht 70.0 in | Wt 201.0 lb

## 2024-04-03 DIAGNOSIS — D130 Benign neoplasm of esophagus: Secondary | ICD-10-CM

## 2024-04-03 DIAGNOSIS — R1013 Epigastric pain: Secondary | ICD-10-CM | POA: Diagnosis not present

## 2024-04-03 DIAGNOSIS — D124 Benign neoplasm of descending colon: Secondary | ICD-10-CM | POA: Diagnosis not present

## 2024-04-03 DIAGNOSIS — K5730 Diverticulosis of large intestine without perforation or abscess without bleeding: Secondary | ICD-10-CM

## 2024-04-03 DIAGNOSIS — K2970 Gastritis, unspecified, without bleeding: Secondary | ICD-10-CM

## 2024-04-03 DIAGNOSIS — Z1211 Encounter for screening for malignant neoplasm of colon: Secondary | ICD-10-CM | POA: Diagnosis not present

## 2024-04-03 DIAGNOSIS — Z860101 Personal history of adenomatous and serrated colon polyps: Secondary | ICD-10-CM

## 2024-04-03 DIAGNOSIS — G4733 Obstructive sleep apnea (adult) (pediatric): Secondary | ICD-10-CM

## 2024-04-03 DIAGNOSIS — K2950 Unspecified chronic gastritis without bleeding: Secondary | ICD-10-CM | POA: Diagnosis not present

## 2024-04-03 DIAGNOSIS — K640 First degree hemorrhoids: Secondary | ICD-10-CM | POA: Diagnosis not present

## 2024-04-03 DIAGNOSIS — K8689 Other specified diseases of pancreas: Secondary | ICD-10-CM

## 2024-04-03 DIAGNOSIS — K861 Other chronic pancreatitis: Secondary | ICD-10-CM

## 2024-04-03 MED ORDER — SODIUM CHLORIDE 0.9 % IV SOLN: 500.0000 mL | Freq: Once | INTRAVENOUS | Status: DC

## 2024-04-03 NOTE — Op Note (Signed)
 [facility] Patient Name: Eric Salas Procedure Date: 04/03/2024 7:37 AM MRN: [id number] Endoscopist: Lynnie Bring , MD, [id number] Age: 68 Referring MD:  Date of Birth: [birth year] Gender: Male Account #: [id number] Procedure:                Colonoscopy Indications:              High risk colon cancer surveillance: Personal                            history of colonic polyps. FH CRC (brother < 60) Medicines:                Monitored Anesthesia Care Procedure:                Pre-Anesthesia Assessment:                           - Prior to the procedure, a History and Physical                            was performed, and patient medications and                            allergies were reviewed. The patient's tolerance of                            previous anesthesia was also reviewed. The risks                            and benefits of the procedure and the sedation                            options and risks were discussed with the patient.                            All questions were answered, and informed consent                            was obtained. Prior Anticoagulants: The patient has                            taken no anticoagulant or antiplatelet agents. ASA                            Grade Assessment: II - A patient with mild systemic                            disease. After reviewing the risks and benefits,                            the patient was deemed in satisfactory condition to                            undergo the procedure.  After obtaining informed consent, the colonoscope                            was passed under direct vision. Throughout the                            procedure, the patient's blood pressure, pulse, and                            oxygen saturations were monitored continuously. The                            Olympus Scope [id number] was introduced through the                            anus and  advanced to the the cecum, identified by                            appendiceal orifice and ileocecal valve. The                            colonoscopy was performed without difficulty. The                            patient tolerated the procedure well. The quality                            of the bowel preparation was good. The ileocecal                            valve, appendiceal orifice, and rectum were                            photographed. Scope In: 8:18:54 AM Scope Out: 8:30:49 AM Scope Withdrawal Time: 0 hours 9 minutes 32 seconds  Total Procedure Duration: 0 hours 11 minutes 55 seconds  Findings:                 Two sessile polyps were found in the proximal                            descending colon and mid descending colon. The                            polyps were 4 to 6 mm in size. These polyps were                            removed with a cold snare. Resection and retrieval                            were complete.                           A few medium-mouthed diverticula were found in the  sigmoid colon.                           Non-bleeding internal hemorrhoids were found during                            retroflexion. The hemorrhoids were small and Grade                            I (internal hemorrhoids that do not prolapse).                           Retroflexion in the right colon was performed.                           The exam was otherwise without abnormality on                            direct and retroflexion views. Complications:            No immediate complications. Estimated Blood Loss:     Estimated blood loss: none. Impression:               - Two 4 to 6 mm polyps in the proximal descending                            colon and in the mid descending colon, removed with                            a cold snare. Resected and retrieved.                           - Mild sigmoid diverticulosis.                           -  Non-bleeding internal hemorrhoids.                           - The examination was otherwise normal on direct                            and retroflexion views. Recommendation:           - Patient has a contact number available for                            emergencies. The signs and symptoms of potential                            delayed complications were discussed with the                            patient. Return to normal activities tomorrow.                            Written discharge instructions were provided to the  patient.                           - Resume previous diet.                           - Continue present medications.                           - Await pathology results.                           - Repeat colonoscopy for surveillance based on                            pathology results.                           - The findings and recommendations were discussed                            with the patient's family. Lynnie Bring, MD 04/03/2024 8:37:44 AM This report has been signed electronically.

## 2024-04-03 NOTE — Progress Notes (Signed)
 Called to room to assist during endoscopic procedure.  Patient ID and intended procedure confirmed with present staff. Received instructions for my participation in the procedure from the performing physician.

## 2024-04-03 NOTE — Progress Notes (Signed)
 Vss nad trans to pacu

## 2024-04-03 NOTE — Op Note (Signed)
 [facility] Patient Name: Eric Salas Procedure Date: 04/03/2024 7:47 AM MRN: [id number] Endoscopist: Lynnie Bring , MD, [id number] Age: 68 Referring MD:  Date of Birth: 1956/07/03 Gender: Male Account #: [id number] Procedure:                Upper GI endoscopy Indications:              Epigastric abdominal pain Medicines:                Monitored Anesthesia Care Procedure:                Pre-Anesthesia Assessment:                           - Prior to the procedure, a History and Physical                            was performed, and patient medications and                            allergies were reviewed. The patient's tolerance of                            previous anesthesia was also reviewed. The risks                            and benefits of the procedure and the sedation                            options and risks were discussed with the patient.                            All questions were answered, and informed consent                            was obtained. Prior Anticoagulants: The patient has                            taken no anticoagulant or antiplatelet agents. ASA                            Grade Assessment: II - A patient with mild systemic                            disease. After reviewing the risks and benefits,                            the patient was deemed in satisfactory condition to                            undergo the procedure.                           After obtaining informed consent, the endoscope was  passed under direct vision. Throughout the                            procedure, the patient's blood pressure, pulse, and                            oxygen saturations were monitored continuously. The                            Olympus Scope [id number] was introduced through the                            mouth, and advanced to the second part of duodenum.                            The upper GI endoscopy  was accomplished without                            difficulty. The patient tolerated the procedure                            well. Scope In: Scope Out: Findings:                 A single 4 mm mucosal papilloma in the upper third                            of the esophagus, 28 cm from the incisors. Removed                            using cold BX forceps                           The Z-line was regular and was found 38 cm from the                            incisors.                           Localized mild inflammation characterized by                            erythema was found in the gastric antrum. Biopsies                            were taken with a cold forceps for histology.                           The examined duodenum was normal. Complications:            No immediate complications. Estimated Blood Loss:     Estimated blood loss: none. Impression:               - Small mucosal nodule found in the esophagus.  Resected                           - Z-line regular, 38 cm from the incisors.                           - Gastritis. Biopsied.                           - Normal examined duodenum. Recommendation:           - Patient has a contact number available for                            emergencies. The signs and symptoms of potential                            delayed complications were discussed with the                            patient. Return to normal activities tomorrow.                            Written discharge instructions were provided to the                            patient.                           - Resume previous diet.                           - Continue present medications.                           - Await pathology results.                           - The findings and recommendations were discussed                            with the patient's family. Lynnie Bring, MD 04/03/2024 8:34:02 AM This report has been signed  electronically.

## 2024-04-03 NOTE — Telephone Encounter (Signed)
 Please find out from DME what they need from us . Order for mask change?

## 2024-04-03 NOTE — Patient Instructions (Signed)
 YOU HAD AN ENDOSCOPIC PROCEDURE TODAY AT THE [facility]:   Refer to the procedure report that was given to you for any specific questions about what was found during the examination.  If the procedure report does not answer your questions, please call your gastroenterologist to clarify.  If you requested that your care partner not be given the details of your procedure findings, then the procedure report has been included in a sealed envelope for you to review at your convenience later.  YOU SHOULD EXPECT: Some feelings of bloating in the abdomen. Passage of more gas than usual.  Walking can help get rid of the air that was put into your GI tract during the procedure and reduce the bloating. If you had a lower endoscopy (such as a colonoscopy or flexible sigmoidoscopy) you may notice spotting of blood in your stool or on the toilet paper. If you underwent a bowel prep for your procedure, you may not have a normal bowel movement for a few days.  Please Note:  You might notice some irritation and congestion in your nose or some drainage.  This is from the oxygen used during your procedure.  There is no need for concern and it should clear up in a day or so.  SYMPTOMS TO REPORT IMMEDIATELY:  Following lower endoscopy (colonoscopy or flexible sigmoidoscopy):  Excessive amounts of blood in the stool  Significant tenderness or worsening of abdominal pains  Swelling of the abdomen that is new, acute  Fever of 100F or higher  Following upper endoscopy (EGD)  Vomiting of blood or coffee ground material  New chest pain or pain under the shoulder blades  Painful or persistently difficult swallowing  New shortness of breath  Fever of 100F or higher  Black, tarry-looking stools  Resume previous diet Await pathology results Continue present medications Handouts on polyps and hemorrhoids given   For urgent or emergent issues, a gastroenterologist can be reached at any hour by calling  [phone]. Do not use MyChart messaging for urgent concerns.    DIET:  We do recommend a small meal at first, but then you may proceed to your regular diet.  Drink plenty of fluids but you should avoid alcoholic beverages for 24 hours.  ACTIVITY:  You should plan to take it easy for the rest of today and you should NOT DRIVE or use heavy machinery until tomorrow (because of the sedation medicines used during the test).    FOLLOW UP: Our staff will call the number listed on your records the next business day following your procedure.  We will call around 7:15- 8:00 am to check on you and address any questions or concerns that you may have regarding the information given to you following your procedure. If we do not reach you, we will leave a message.     If any biopsies were taken you will be contacted by phone or by letter within the next 1-3 weeks.  Please call us  at [phone] if you have not heard about the biopsies in 3 weeks.    SIGNATURES/CONFIDENTIALITY: You and/or your care partner have signed paperwork which will be entered into your electronic medical record.  These signatures attest to the fact that that the information above on your After Visit Summary has been reviewed and is understood.  Full responsibility of the confidentiality of this discharge information lies with you and/or your care-partner.

## 2024-04-03 NOTE — Progress Notes (Signed)
Vitals-CW  Pt's states no medical or surgical changes since previsit or office visit. 

## 2024-04-03 NOTE — Progress Notes (Signed)
 02/01/2024 Eric Salas [id number] [birth year]   Referring provider: Berneta Elsie Salas,* Primary GI doctor: 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  Had episode 2 to 3 months ago of epigastric discomfort unknown if this was acute pancreatitis versus GERD versus muscular - Will repeat lipase, amylase, c-Met, CA 19-9 - Will discuss with Dr. Eulene. Salas about the pancreatic ductal dilation and if EUS or other imaging needs to be pursued to evaluate for possible pancreatic ductal stone - Could consider HIDA scan possible cholecystectomy   Palpitations lasts for mins, no associated symptoms, some mild GERD Worse with sitting, lying in bed 01/2023 stress test low risk 08/2022 Zio path 3 second pause, 10 episodes of SVT Did a trial of prilosec without help, denies GERD, dysphagia, symptoms of LPR, was doing AB crunches - Possible LPR will plan on EGD with colonoscopy to evaluate further, consider barium swallow, I discussed risks of EGD with patient today, including risk of sedation, bleeding or perforation.  Patient provides understanding and gave verbal consent to proceed. - If unremarkable possible musculoskeletal try Salonpas patches stop abdominal crunches - Follow back up with cardiology, previous SVT  consider repeat Zio patch - Get back on CPAP, stay hydrated, taught vagal maneuver   Personal history of colon polyps 11/01/2017 colonoscopy [name] good prep grade 2 hemorrhoids, 4 mm TA polyp, diverticula recall 5 years Will schedule at [facility] with [name] We have discussed the risks of bleeding, infection, perforation, medication reactions, and remote risk of death associated with colonoscopy. All questions were answered and the patient acknowledges these risk and wishes to proceed.   Patient Care Team: Eric Elsie Sayre, MD as PCP - General (Family Medicine) Kate Lonni CROME, MD as PCP - Cardiology (Cardiology)   HISTORY OF PRESENT ILLNESS: 68 y.o. male with a past medical history listed below presents for evaluation of elevated LFTs and colonoscopy discussion.    Last seen in the office via telemedicine 01/24/2019 for acute pancreatitis. Unknown etiology . No alcohol, gallstones, abn LFTs, trauma, FH of pancreatitis, cocaine use, use of medications (except for ACE) associated with pancreatitis, hypercalcemia, intake of any herbal medications or previous history of pancreatitis.  Nl CBC, CMP, amylase, TG, CA19-9, CEA and AFP 12/2018    Patient presents to office with history of acute pancreatitis in 2020, without known etiology.  States about 2 to 3 months ago patient had some epigastric fullness it felt like food was sitting there with some discomfort, belching burping.  Patient denies at the time fever, chills, yellowing of eyes or skin, this lasted for several days with some associated palpitations.  Patient also states during this episode he may have had slightly looser stools, brown denies any melena hematochezia or pale stools.  Patient normally has bowel movement every day. Patient also describes intermittent frequent daily palpitations that has had for a long time can be worse with lying  down, it is not exertional and patient has no accompaniments.  Denies any shortness of  breath chest pain.  Patient has no real GERD symptoms possible LPR, denies dysphagia.  Denies NSAIDs, alcohol.   He is very active with his grandkids, has 12.     He  reports that he quit smoking about 18 years ago. His smoking use included cigarettes. He has never used smokeless tobacco. He reports that he does not currently use alcohol. He reports that he does not use drugs.   RELEVANT GI HISTORY, IMAGING AND LABS: Results           CBC Labs (Brief)          Component Value Date/Time    WBC 5.8 12/28/2023 0900    RBC 4.56 12/28/2023 0900    HGB 13.9 12/28/2023 0900    HCT 41.4 12/28/2023 0900    PLT 164.0 12/28/2023 0900    MCV 90.9 12/28/2023 0900    MCH 31.0 01/03/2019 0800    MCHC 33.6 12/28/2023 0900    RDW 14.8 12/28/2023 0900    LYMPHSABS 2.1 01/03/2019 0800    MONOABS 0.6 01/03/2019 0800    EOSABS 0.3 01/03/2019 0800    BASOSABS 0.1 01/03/2019 0800      Recent Labs (within last 365 days)      Recent Labs    11/27/23 1517 12/28/23 0900  HGB 13.8 13.9        CMP     Labs (Brief)          Component Value Date/Time    NA 140 12/28/2023 0900    NA 141 06/22/2023 0856    K 3.8 12/28/2023 0900    CL 103 12/28/2023 0900    CO2 30 12/28/2023 0900    GLUCOSE 91 12/28/2023 0900    BUN 18 12/28/2023 0900    BUN 20 06/22/2023 0856    CREATININE 0.94 12/28/2023 0900    CALCIUM  9.3 12/28/2023 0900    PROT 6.9 12/28/2023 0900    ALBUMIN 4.5 12/28/2023 0900    AST 19 12/28/2023 0900    ALT 14 12/28/2023 0900    ALKPHOS 65 12/28/2023 0900    BILITOT 0.6 12/28/2023 0900    GFRNONAA >60 01/03/2019 0800    GFRAA >60 01/03/2019 0800          Latest Ref Rng & Units 12/28/2023    9:00 AM 11/27/2023    3:17 PM 11/08/2022    9:08 AM  Hepatic Function  Total Protein 6.0 - 8.3 g/dL 6.9  6.8  6.7   Albumin 3.5 - 5.2 g/dL 4.5  4.3  4.3   AST 0 - 37 U/L 19  21  26    ALT 0 - 53 U/L 14  23  22    Alk Phosphatase 39 - 117 U/L 65  66  51   Total Bilirubin 0.2 - 1.2  mg/dL 0.6  0.4  0.7   Bilirubin, Direct 0.0 - 0.3 mg/dL     0.1       Current Medications:    Current Outpatient Medications (Endocrine & Metabolic):    metFORMIN  (GLUCOPHAGE -XR) 500 MG 24 hr tablet, Take 1 tablet (500 mg total) by mouth at bedtime.   Current Outpatient Medications (Cardiovascular):    amLODipine  (NORVASC ) 10 MG tablet, Take 1 tablet (10 mg total) by mouth daily.   ezetimibe  (ZETIA ) 10 MG tablet, Take 1 tablet (10 mg total) by mouth daily.   irbesartan  (AVAPRO ) 300 MG tablet,  Take 1 tablet (300 mg total) by mouth daily.   rosuvastatin  (CRESTOR ) 20 MG tablet, Take 1 tablet (20 mg total) by mouth every other day.   Current Outpatient Medications (Respiratory):    flunisolide  (NASALIDE ) 25 MCG/ACT (0.025%) SOLN, 2 sprays 2 (two) times daily.   Current Outpatient Medications (Analgesics):    allopurinol  (ZYLOPRIM ) 300 MG tablet, Take 1 tablet by mouth once daily   aspirin  EC 81 MG tablet, Take 1 tablet (81 mg total) by mouth daily. Swallow whole.   Colchicine  0.6 MG CAPS, 1.2mg  po x1  then 0.6mg  an hour later; do not repeat treatment for at least 3 days   indomethacin  (INDOCIN ) 50 MG capsule, TAKE 1 CAPSULE BY MOUTH WITH  FOOD DAILY AS NEEDED FOR PAIN     Current Outpatient Medications (Other):    ALPRAZolam  (XANAX ) 0.25 MG tablet, Take 1 tablet 30 minutes prior to MRI.   hydroxychloroquine (PLAQUENIL) 200 MG tablet, TAKE 2 TABLETS BY MOUTH ONCE DAILY AS DIRECTED for 30   Na Sulfate-K Sulfate-Mg Sulfate concentrate (SUPREP BOWEL PREP KIT) 17.5-3.13-1.6 GM/177ML SOLN, Take 1 kit (354 mLs total) by mouth once for 1 dose.   omeprazole  (PRILOSEC) 20 MG capsule, Take 1 capsule (20 mg total) by mouth daily.   Medical History:      Past Medical History:  Diagnosis Date   Arthritis     Cataract      lt. eye beginning stage   H/O rotator cuff surgery 2024    Right Shoulder   Hyperlipidemia     Hypertension          Allergies:  Allergies      Allergies  Allergen  Reactions   Codeine Itching        Surgical History:  He  has a past surgical history that includes Colonoscopy and Shoulder arthroscopy (Right, 01/26/2023). Family History:  His family history includes Alcohol abuse in his father; Cancer in his brother and mother; Colon cancer in his brother; Diabetes in his brother, brother, and father; Drug abuse in his brother; Hearing loss in his brother; Heart attack in his father; Heart disease in his father; High Cholesterol in his brother, brother, and father; High blood pressure in his father; Miscarriages / Stillbirths in his mother; Stomach cancer in his brother; Stroke in his father.   REVIEW OF SYSTEMS  : All other systems reviewed and negative except where noted in the History of Present Illness.   PHYSICAL EXAM: BP (!) 140/68   Pulse (!) 53   Ht 5' 10 (1.778 m)   Wt 201 lb (91.2 kg)   BMI 28.84 kg/m  General Appearance: Well nourished, in no apparent distress. Respiratory: Respiratory effort normal, BS equal bilaterally without rales, rhonchi, wheezing. Cardio: RRR with no MRGs. Abdomen: Soft,  Non-distended ,active bowel sounds. mild tenderness in the epigastrium. Without guarding and Without rebound. No masses. Rectal: Not evaluated Musculoskeletal: Full ROM, Normal gait Neuro: Alert and  oriented x4;  No focal deficits. Psych:  Cooperative. Normal mood and affect.      Alan JONELLE Coombs, PA-C     Attending physician's note   I have taken history, reviewed the chart and examined the patient. I performed a substantive portion of this encounter, including complete performance of at least one of the key components, in conjunction with the APP. I agree with the Advanced Practitioner's note, impression and recommendations.    Anselm Bring, MD [facility] [phone]

## 2024-04-04 ENCOUNTER — Telehealth: Payer: Self-pay

## 2024-04-04 ENCOUNTER — Other Ambulatory Visit: Payer: Self-pay | Admitting: *Deleted

## 2024-04-04 DIAGNOSIS — G4733 Obstructive sleep apnea (adult) (pediatric): Secondary | ICD-10-CM

## 2024-04-04 NOTE — Telephone Encounter (Signed)
  Follow up Call-     04/03/2024    7:11 AM  Call back number  Permission to leave phone message Yes     Patient questions:  Do you have a fever, pain , or abdominal swelling? No. Pain Score  0 *  Have you tolerated food without any problems? Yes.    Have you been able to return to your normal activities? Yes.    Do you have any questions about your discharge instructions: Diet   No. Medications  No. Follow up visit  No.  Do you have questions or concerns about your Care? No.  Actions: * If pain score is 4 or above: No action needed, pain <4.

## 2024-04-04 NOTE — Progress Notes (Signed)
 Mask refit through DME,  I cosigned the order. CD

## 2024-04-06 DIAGNOSIS — G4733 Obstructive sleep apnea (adult) (pediatric): Secondary | ICD-10-CM | POA: Diagnosis not present

## 2024-04-08 ENCOUNTER — Other Ambulatory Visit: Payer: Self-pay | Admitting: Family Medicine

## 2024-04-08 DIAGNOSIS — I10 Essential (primary) hypertension: Secondary | ICD-10-CM

## 2024-04-08 DIAGNOSIS — R7303 Prediabetes: Secondary | ICD-10-CM

## 2024-04-09 ENCOUNTER — Telehealth: Payer: Self-pay | Admitting: Family Medicine

## 2024-04-09 LAB — SURGICAL PATHOLOGY

## 2024-04-09 NOTE — Telephone Encounter (Signed)
 [facility] cancellation

## 2024-04-14 ENCOUNTER — Ambulatory Visit: Payer: Self-pay | Admitting: Gastroenterology

## 2024-04-14 DIAGNOSIS — M109 Gout, unspecified: Secondary | ICD-10-CM

## 2024-04-17 MED ORDER — INDOMETHACIN 50 MG PO CAPS: ORAL_CAPSULE | ORAL | 0 refills | Status: DC

## 2024-04-17 MED ORDER — INDOMETHACIN 50 MG PO CAPS: ORAL_CAPSULE | ORAL | 0 refills | Status: AC

## 2024-04-17 NOTE — Addendum Note (Signed)
 Addended by: BERNETA FALLOW A on: 04/17/2024 05:00 PM   Modules accepted: Orders

## 2024-04-17 NOTE — Addendum Note (Signed)
 Addended by: BERNETA ELSIE LABOR on: 04/17/2024 01:10 PM   Modules accepted: Orders

## 2024-05-06 DIAGNOSIS — G4733 Obstructive sleep apnea (adult) (pediatric): Secondary | ICD-10-CM | POA: Diagnosis not present

## 2024-05-16 ENCOUNTER — Other Ambulatory Visit: Payer: Self-pay | Admitting: Family Medicine

## 2024-05-16 DIAGNOSIS — R7303 Prediabetes: Secondary | ICD-10-CM

## 2024-05-16 DIAGNOSIS — I10 Essential (primary) hypertension: Secondary | ICD-10-CM

## 2024-05-16 MED ORDER — IRBESARTAN 300 MG PO TABS: 300.0000 mg | ORAL_TABLET | Freq: Every day | ORAL | 0 refills | Status: DC

## 2024-05-16 NOTE — Telephone Encounter (Signed)
 Copied from CRM #[id number]. Topic: Clinical - Medication Refill >> May 16, 2024  8:34 AM Victoria A wrote: Medication: irbesartan  (AVAPRO ) 300 MG tablet  Has the patient contacted their pharmacy? No (Agent: If no, request that the patient contact the pharmacy for the refill. If patient does not wish to contact the pharmacy document the reason why and proceed with request.) (Agent: If yes, when and what did the pharmacy advise?)  This is the patient's preferred pharmacy:   [facility] - [place], [place] - [facility] AT Portal to [facility] [address] [place] [address] Phone: [phone] Fax: [phone]  Is this the correct pharmacy for this prescription? Yes If no, delete pharmacy and type the correct one.   Has the prescription been filled recently? No  Is the patient out of the medication? No Has a few days left  Has the patient been seen for an appointment in the last year OR does the patient have an upcoming appointment? Yes  Can we respond through MyChart? Yes  Agent: Please be advised that Rx refills may take up to 3 business days. We ask that you follow-up with your pharmacy.

## 2024-05-20 ENCOUNTER — Other Ambulatory Visit: Payer: Self-pay | Admitting: Family Medicine

## 2024-05-20 DIAGNOSIS — I10 Essential (primary) hypertension: Secondary | ICD-10-CM

## 2024-05-21 ENCOUNTER — Other Ambulatory Visit: Payer: Self-pay | Admitting: Family Medicine

## 2024-05-21 DIAGNOSIS — I10 Essential (primary) hypertension: Secondary | ICD-10-CM

## 2024-05-21 DIAGNOSIS — R7303 Prediabetes: Secondary | ICD-10-CM

## 2024-05-28 ENCOUNTER — Encounter: Payer: Self-pay | Admitting: Family Medicine

## 2024-07-21 NOTE — Progress Notes (Signed)
 Eric Salas

## 2024-07-22 ENCOUNTER — Telehealth: Admitting: Family Medicine

## 2024-07-22 ENCOUNTER — Encounter: Payer: Self-pay | Admitting: Family Medicine

## 2024-07-22 DIAGNOSIS — G4733 Obstructive sleep apnea (adult) (pediatric): Secondary | ICD-10-CM

## 2024-07-22 NOTE — Progress Notes (Signed)
 "  PATIENT: Eric Salas DOB: April 01, 1956  REASON FOR VISIT: follow up HISTORY FROM: patient  Virtual Visit via Mychart Video Note  I connected with Eric Salas on 07/22/2024 at  9:30 AM EST by video and verified that I am speaking with the correct person using two identifiers.   I discussed the limitations, risks, security and privacy concerns of performing an evaluation and management service by video and the availability of in person appointments. I also discussed with the patient that there may be a patient responsible charge related to this service. The patient expressed understanding and agreed to proceed.   History of Present Illness:  07/22/2024 ALL (Mychart): Eric Salas returns for follow up for OSA on CPAP. He has not used his machine much at all over the past year. He had significant difficulty tolerating the FFM and nasal mask. He called DME to request trial of nasal pillows but they advised him to contact us . He is interested in giving it another try.     07/17/2023 (Mychart): Eric Salas is a 69 y.o. male here today for follow up for OSA on CPAP. He was seen in consult with Dr Buck 07/2022 snoring and EDS. HST 04/2023 showed moderate obstructive sleep apnea with a total AHI of 16/hour and O2 nadir of 80%. AutoPAP advised. Since, he reports doing well. He is adjusting to his machine. Using a FFM. He is sleeping better. Feels more energized during the day. Not needing to nap. ESS previously 10, now 1.     History (copied from Dr Obie previous note)  Dear Eric Salas,   I saw your patient, Eric Salas, upon your kind request in my sleep clinic today for initial consultation of his sleep disorder, in particular, concern for underlying obstructive sleep apnea.  The patient is unaccompanied today.  As you know, Eric Salas is a 69 year old male with an underlying medical history of hypertension, gout, hyperlipidemia, sinus tachycardia and overweight state,  who reports snoring and excessive daytime somnolence as well as waking up with a sense of gasping for air.  His Epworth sleepiness score is 10 out of 24, fatigue severity score is 28 out of 63.  He is currently wearing a Zio patch, has completed almost 1 week.  I reviewed your office note from 07/14/2022. He was recently seen by cardiology for palpitations on 07/26/22 and I reviewed the visit note. His EKG showed sinus bradycardia.  He was advised to pursue further workup.  He is going to complete his Zio patch next week.  He has a carotid Doppler scheduled for tomorrow and echocardiogram for next week.  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  He has done so for 12 years.  He lives with his wife, they have grown children, between the 2 of them they have 5 kids and 12 grandchildren.  He has 1 dog in the household and they have about 30 chickens.  He has woken up with a sense of gasping for air.  His wife also snores, he does have a TV in the bedroom but turns it off in the middle of the night.  He has difficulty maintaining sleep.  He has been very active, he goes to the gym every morning, 6 days a week and 1 day a week he rides his bike.  He does abdominal crunches every morning.  He drinks caffeine typically in the morning, 2 cups of coffee feet and very occasionally 1 or 2 cups in  the evening.  He has lost weight, lost up to 40 pounds and gained some weight back.  No family history of sleep apnea.  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   Observations/Objective:  Generalized: Well developed, in no acute distress  Mentation: Alert oriented to time, place, history taking. Follows all commands speech and language fluent   Assessment and Plan:  69 y.o. year old male  has a past medical history of Arthritis, Cataract, H/O rotator cuff surgery (2024), Hyperlipidemia, and Hypertension. here with    ICD-10-CM   1. OSA on CPAP  G47.33 For home use only  DME continuous positive airway pressure (CPAP)      Rony has had difficulty tolerating CPAP therapy. CPAP compliance report shows very little usage usage. We will send mask refitting orders to DME. We have discussed sleep study results and ways to get used to using CPAP. He was encouraged to continue using CPAP nightly for at least 4 hours. Healthy lifestyle habits advised. He will follow up with me in 3 months.   Orders Placed This Encounter  Procedures   For home use only DME continuous positive airway pressure (CPAP)    Please offer patient options for nasal pillows. He has failed FFM and nasal mask. Heated Humidity with all supplies as needed    Length of Need:   Lifetime    Patient has OSA or probable OSA:   Yes    Is the patient currently using CPAP in the home:   Yes    Settings:   Other see comments    CPAP supplies needed:   Mask, headgear, cushions, filters, heated tubing and water chamber    No orders of the defined types were placed in this encounter.    Follow Up Instructions:  I discussed the assessment and treatment plan with the patient. The patient was provided an opportunity to ask questions and all were answered. The patient agreed with the plan and demonstrated an understanding of the instructions.   The patient was advised to call back or seek an in-person evaluation if the symptoms worsen or if the condition fails to improve as anticipated.  I provided 15 minutes of non-face-to-face time during this encounter. Patient located at their place of residence during Mychart visit. Provider is in the office.    Shanieka Blea, NP  "

## 2024-07-22 NOTE — Patient Instructions (Addendum)
 Please continue using your CPAP regularly. While your insurance requires that you use CPAP at least 4 hours each night on 70% of the nights, I recommend, that you not skip any nights and use it throughout the night if you can. Getting used to CPAP and staying with the treatment long term does take time and patience and discipline. Untreated obstructive sleep apnea when it is moderate to severe can have an adverse impact on cardiovascular health and raise her risk for heart disease, arrhythmias, hypertension, congestive heart failure, stroke and diabetes. Untreated obstructive sleep apnea causes sleep disruption, nonrestorative sleep, and sleep deprivation. This can have an impact on your day to day functioning and cause daytime sleepiness and impairment of cognitive function, memory loss, mood disturbance, and problems focussing. Using CPAP regularly can improve these symptoms.  We will update supply orders, today. I will ask the DME to talk to you about nasal pillow options. Try to use CPAP every night to help you adjust to therapy quicker.   Follow up in 3 months

## 2024-07-23 ENCOUNTER — Telehealth: Payer: Medicare Other | Admitting: Family Medicine

## 2024-07-23 ENCOUNTER — Telehealth: Payer: Self-pay

## 2024-07-23 NOTE — Telephone Encounter (Signed)
 SABRA

## 2024-07-24 NOTE — Telephone Encounter (Signed)
 SABRA

## 2024-08-15 ENCOUNTER — Other Ambulatory Visit: Payer: Self-pay | Admitting: Family Medicine

## 2024-08-15 DIAGNOSIS — I10 Essential (primary) hypertension: Secondary | ICD-10-CM

## 2024-08-16 ENCOUNTER — Other Ambulatory Visit: Payer: Self-pay | Admitting: Family Medicine

## 2024-08-19 ENCOUNTER — Telehealth: Payer: Self-pay

## 2024-08-19 DIAGNOSIS — R7303 Prediabetes: Secondary | ICD-10-CM

## 2024-08-19 DIAGNOSIS — I10 Essential (primary) hypertension: Secondary | ICD-10-CM

## 2024-08-19 MED ORDER — IRBESARTAN 300 MG PO TABS: 300.0000 mg | ORAL_TABLET | Freq: Every day | ORAL | 1 refills | Status: AC

## 2024-08-19 NOTE — Telephone Encounter (Signed)
 Refill request for Ibesartan.  LOV 12/28/23 LR  05/16/24, #90, 0 rf

## 2024-10-10 ENCOUNTER — Telehealth: Admitting: Family Medicine
